# Patient Record
Sex: Female | Born: 1935
Health system: Southern US, Community
[De-identification: ages and names within clinical notes are randomized; demographics above are authoritative.]

## PROBLEM LIST (undated history)

## (undated) DIAGNOSIS — I10 Essential (primary) hypertension: Secondary | ICD-10-CM

## (undated) HISTORY — PX: ABDOMINAL HYSTERECTOMY: SHX81

## (undated) HISTORY — PX: OVARIAN CYST REMOVAL: SHX89

---

## 1993-04-09 HISTORY — PX: DUODENAL DIVERTICULECTOMY: SHX6386

## 2014-12-11 DIAGNOSIS — J410 Simple chronic bronchitis: Secondary | ICD-10-CM | POA: Diagnosis not present

## 2015-01-17 DIAGNOSIS — F341 Dysthymic disorder: Secondary | ICD-10-CM | POA: Diagnosis not present

## 2015-01-17 DIAGNOSIS — I10 Essential (primary) hypertension: Secondary | ICD-10-CM | POA: Diagnosis not present

## 2015-01-17 DIAGNOSIS — J449 Chronic obstructive pulmonary disease, unspecified: Secondary | ICD-10-CM | POA: Diagnosis not present

## 2015-01-17 DIAGNOSIS — Z6822 Body mass index (BMI) 22.0-22.9, adult: Secondary | ICD-10-CM | POA: Diagnosis not present

## 2015-01-17 DIAGNOSIS — E785 Hyperlipidemia, unspecified: Secondary | ICD-10-CM | POA: Diagnosis not present

## 2015-01-28 DIAGNOSIS — J410 Simple chronic bronchitis: Secondary | ICD-10-CM | POA: Diagnosis not present

## 2015-02-05 DIAGNOSIS — Z6822 Body mass index (BMI) 22.0-22.9, adult: Secondary | ICD-10-CM | POA: Diagnosis not present

## 2015-02-05 DIAGNOSIS — Z0000 Encounter for general adult medical examination without abnormal findings: Secondary | ICD-10-CM | POA: Diagnosis not present

## 2015-02-14 DIAGNOSIS — Z13820 Encounter for screening for osteoporosis: Secondary | ICD-10-CM | POA: Diagnosis not present

## 2015-02-14 DIAGNOSIS — Z1231 Encounter for screening mammogram for malignant neoplasm of breast: Secondary | ICD-10-CM | POA: Diagnosis not present

## 2015-02-14 DIAGNOSIS — M8589 Other specified disorders of bone density and structure, multiple sites: Secondary | ICD-10-CM | POA: Diagnosis not present

## 2015-07-16 DIAGNOSIS — I10 Essential (primary) hypertension: Secondary | ICD-10-CM | POA: Diagnosis not present

## 2015-07-16 DIAGNOSIS — E785 Hyperlipidemia, unspecified: Secondary | ICD-10-CM | POA: Diagnosis not present

## 2015-07-16 DIAGNOSIS — Z6822 Body mass index (BMI) 22.0-22.9, adult: Secondary | ICD-10-CM | POA: Diagnosis not present

## 2015-07-16 DIAGNOSIS — J449 Chronic obstructive pulmonary disease, unspecified: Secondary | ICD-10-CM | POA: Diagnosis not present

## 2015-07-22 DIAGNOSIS — Z1211 Encounter for screening for malignant neoplasm of colon: Secondary | ICD-10-CM | POA: Diagnosis not present

## 2015-09-15 DIAGNOSIS — Z23 Encounter for immunization: Secondary | ICD-10-CM | POA: Diagnosis not present

## 2015-12-23 DIAGNOSIS — R6889 Other general symptoms and signs: Secondary | ICD-10-CM | POA: Diagnosis not present

## 2015-12-26 DIAGNOSIS — C4442 Squamous cell carcinoma of skin of scalp and neck: Secondary | ICD-10-CM | POA: Diagnosis not present

## 2015-12-26 DIAGNOSIS — L821 Other seborrheic keratosis: Secondary | ICD-10-CM | POA: Diagnosis not present

## 2015-12-26 DIAGNOSIS — D1801 Hemangioma of skin and subcutaneous tissue: Secondary | ICD-10-CM | POA: Diagnosis not present

## 2015-12-26 DIAGNOSIS — L570 Actinic keratosis: Secondary | ICD-10-CM | POA: Diagnosis not present

## 2016-01-05 DIAGNOSIS — J449 Chronic obstructive pulmonary disease, unspecified: Secondary | ICD-10-CM | POA: Diagnosis not present

## 2016-01-06 DIAGNOSIS — J449 Chronic obstructive pulmonary disease, unspecified: Secondary | ICD-10-CM | POA: Diagnosis not present

## 2016-01-14 DIAGNOSIS — J449 Chronic obstructive pulmonary disease, unspecified: Secondary | ICD-10-CM | POA: Diagnosis not present

## 2016-01-14 DIAGNOSIS — I10 Essential (primary) hypertension: Secondary | ICD-10-CM | POA: Diagnosis not present

## 2016-01-14 DIAGNOSIS — M810 Age-related osteoporosis without current pathological fracture: Secondary | ICD-10-CM | POA: Diagnosis not present

## 2016-01-14 DIAGNOSIS — F341 Dysthymic disorder: Secondary | ICD-10-CM | POA: Diagnosis not present

## 2016-01-14 DIAGNOSIS — E785 Hyperlipidemia, unspecified: Secondary | ICD-10-CM | POA: Diagnosis not present

## 2016-01-15 DIAGNOSIS — R143 Flatulence: Secondary | ICD-10-CM | POA: Diagnosis not present

## 2016-01-15 DIAGNOSIS — K589 Irritable bowel syndrome without diarrhea: Secondary | ICD-10-CM | POA: Diagnosis not present

## 2016-01-16 DIAGNOSIS — J449 Chronic obstructive pulmonary disease, unspecified: Secondary | ICD-10-CM | POA: Diagnosis not present

## 2016-01-16 DIAGNOSIS — R1011 Right upper quadrant pain: Secondary | ICD-10-CM | POA: Diagnosis not present

## 2016-02-16 DIAGNOSIS — J449 Chronic obstructive pulmonary disease, unspecified: Secondary | ICD-10-CM | POA: Diagnosis not present

## 2016-02-19 DIAGNOSIS — Z1231 Encounter for screening mammogram for malignant neoplasm of breast: Secondary | ICD-10-CM | POA: Diagnosis not present

## 2016-03-17 DIAGNOSIS — J449 Chronic obstructive pulmonary disease, unspecified: Secondary | ICD-10-CM | POA: Diagnosis not present

## 2016-04-17 DIAGNOSIS — J449 Chronic obstructive pulmonary disease, unspecified: Secondary | ICD-10-CM | POA: Diagnosis not present

## 2016-05-17 DIAGNOSIS — J449 Chronic obstructive pulmonary disease, unspecified: Secondary | ICD-10-CM | POA: Diagnosis not present

## 2016-06-17 DIAGNOSIS — J449 Chronic obstructive pulmonary disease, unspecified: Secondary | ICD-10-CM | POA: Diagnosis not present

## 2016-06-22 DIAGNOSIS — R6889 Other general symptoms and signs: Secondary | ICD-10-CM | POA: Diagnosis not present

## 2016-07-06 DIAGNOSIS — R6889 Other general symptoms and signs: Secondary | ICD-10-CM | POA: Diagnosis not present

## 2016-07-14 DIAGNOSIS — M810 Age-related osteoporosis without current pathological fracture: Secondary | ICD-10-CM | POA: Diagnosis not present

## 2016-07-14 DIAGNOSIS — I10 Essential (primary) hypertension: Secondary | ICD-10-CM | POA: Diagnosis not present

## 2016-07-14 DIAGNOSIS — Z6822 Body mass index (BMI) 22.0-22.9, adult: Secondary | ICD-10-CM | POA: Diagnosis not present

## 2016-07-14 DIAGNOSIS — J449 Chronic obstructive pulmonary disease, unspecified: Secondary | ICD-10-CM | POA: Diagnosis not present

## 2016-07-14 DIAGNOSIS — E785 Hyperlipidemia, unspecified: Secondary | ICD-10-CM | POA: Diagnosis not present

## 2016-07-18 DIAGNOSIS — J449 Chronic obstructive pulmonary disease, unspecified: Secondary | ICD-10-CM | POA: Diagnosis not present

## 2016-08-17 DIAGNOSIS — J449 Chronic obstructive pulmonary disease, unspecified: Secondary | ICD-10-CM | POA: Diagnosis not present

## 2016-09-14 DIAGNOSIS — Z23 Encounter for immunization: Secondary | ICD-10-CM | POA: Diagnosis not present

## 2016-09-17 DIAGNOSIS — J449 Chronic obstructive pulmonary disease, unspecified: Secondary | ICD-10-CM | POA: Diagnosis not present

## 2017-03-17 DIAGNOSIS — J449 Chronic obstructive pulmonary disease, unspecified: Secondary | ICD-10-CM | POA: Diagnosis not present

## 2017-04-17 DIAGNOSIS — J449 Chronic obstructive pulmonary disease, unspecified: Secondary | ICD-10-CM | POA: Diagnosis not present

## 2017-05-17 DIAGNOSIS — J449 Chronic obstructive pulmonary disease, unspecified: Secondary | ICD-10-CM | POA: Diagnosis not present

## 2017-06-17 DIAGNOSIS — J449 Chronic obstructive pulmonary disease, unspecified: Secondary | ICD-10-CM | POA: Diagnosis not present

## 2017-07-18 DIAGNOSIS — J449 Chronic obstructive pulmonary disease, unspecified: Secondary | ICD-10-CM | POA: Diagnosis not present

## 2017-07-21 DIAGNOSIS — E782 Mixed hyperlipidemia: Secondary | ICD-10-CM | POA: Diagnosis not present

## 2017-07-21 DIAGNOSIS — G4733 Obstructive sleep apnea (adult) (pediatric): Secondary | ICD-10-CM | POA: Diagnosis not present

## 2017-07-21 DIAGNOSIS — K210 Gastro-esophageal reflux disease with esophagitis: Secondary | ICD-10-CM | POA: Diagnosis not present

## 2017-07-21 DIAGNOSIS — D531 Other megaloblastic anemias, not elsewhere classified: Secondary | ICD-10-CM | POA: Diagnosis not present

## 2017-07-21 DIAGNOSIS — I10 Essential (primary) hypertension: Secondary | ICD-10-CM | POA: Diagnosis not present

## 2017-07-28 DIAGNOSIS — K5289 Other specified noninfective gastroenteritis and colitis: Secondary | ICD-10-CM | POA: Diagnosis not present

## 2017-07-29 DIAGNOSIS — R194 Change in bowel habit: Secondary | ICD-10-CM | POA: Diagnosis not present

## 2017-09-17 DIAGNOSIS — J449 Chronic obstructive pulmonary disease, unspecified: Secondary | ICD-10-CM | POA: Diagnosis not present

## 2017-09-22 DIAGNOSIS — R197 Diarrhea, unspecified: Secondary | ICD-10-CM | POA: Diagnosis not present

## 2017-09-27 DIAGNOSIS — A0472 Enterocolitis due to Clostridium difficile, not specified as recurrent: Secondary | ICD-10-CM | POA: Diagnosis not present

## 2017-10-17 DIAGNOSIS — J449 Chronic obstructive pulmonary disease, unspecified: Secondary | ICD-10-CM | POA: Diagnosis not present

## 2017-10-18 DIAGNOSIS — K589 Irritable bowel syndrome without diarrhea: Secondary | ICD-10-CM | POA: Diagnosis not present

## 2017-11-17 DIAGNOSIS — J449 Chronic obstructive pulmonary disease, unspecified: Secondary | ICD-10-CM | POA: Diagnosis not present

## 2017-11-24 DIAGNOSIS — G4733 Obstructive sleep apnea (adult) (pediatric): Secondary | ICD-10-CM | POA: Diagnosis not present

## 2017-11-24 DIAGNOSIS — E782 Mixed hyperlipidemia: Secondary | ICD-10-CM | POA: Diagnosis not present

## 2017-11-24 DIAGNOSIS — K210 Gastro-esophageal reflux disease with esophagitis: Secondary | ICD-10-CM | POA: Diagnosis not present

## 2017-11-24 DIAGNOSIS — I10 Essential (primary) hypertension: Secondary | ICD-10-CM | POA: Diagnosis not present

## 2017-12-18 DIAGNOSIS — J449 Chronic obstructive pulmonary disease, unspecified: Secondary | ICD-10-CM | POA: Diagnosis not present

## 2018-01-15 DIAGNOSIS — J449 Chronic obstructive pulmonary disease, unspecified: Secondary | ICD-10-CM | POA: Diagnosis not present

## 2018-02-15 DIAGNOSIS — J449 Chronic obstructive pulmonary disease, unspecified: Secondary | ICD-10-CM | POA: Diagnosis not present

## 2018-03-17 DIAGNOSIS — J449 Chronic obstructive pulmonary disease, unspecified: Secondary | ICD-10-CM | POA: Diagnosis not present

## 2018-03-24 DIAGNOSIS — G4733 Obstructive sleep apnea (adult) (pediatric): Secondary | ICD-10-CM | POA: Diagnosis not present

## 2018-03-24 DIAGNOSIS — E782 Mixed hyperlipidemia: Secondary | ICD-10-CM | POA: Diagnosis not present

## 2018-03-24 DIAGNOSIS — I10 Essential (primary) hypertension: Secondary | ICD-10-CM | POA: Diagnosis not present

## 2018-03-24 DIAGNOSIS — K210 Gastro-esophageal reflux disease with esophagitis: Secondary | ICD-10-CM | POA: Diagnosis not present

## 2018-03-27 DIAGNOSIS — J410 Simple chronic bronchitis: Secondary | ICD-10-CM | POA: Diagnosis not present

## 2018-03-27 DIAGNOSIS — J449 Chronic obstructive pulmonary disease, unspecified: Secondary | ICD-10-CM | POA: Diagnosis not present

## 2018-04-17 DIAGNOSIS — J449 Chronic obstructive pulmonary disease, unspecified: Secondary | ICD-10-CM | POA: Diagnosis not present

## 2018-04-26 DIAGNOSIS — H10021 Other mucopurulent conjunctivitis, right eye: Secondary | ICD-10-CM | POA: Diagnosis not present

## 2018-05-02 DIAGNOSIS — J449 Chronic obstructive pulmonary disease, unspecified: Secondary | ICD-10-CM | POA: Diagnosis not present

## 2018-05-02 DIAGNOSIS — J410 Simple chronic bronchitis: Secondary | ICD-10-CM | POA: Diagnosis not present

## 2018-05-17 DIAGNOSIS — J449 Chronic obstructive pulmonary disease, unspecified: Secondary | ICD-10-CM | POA: Diagnosis not present

## 2018-05-30 DIAGNOSIS — J410 Simple chronic bronchitis: Secondary | ICD-10-CM | POA: Diagnosis not present

## 2018-05-30 DIAGNOSIS — J449 Chronic obstructive pulmonary disease, unspecified: Secondary | ICD-10-CM | POA: Diagnosis not present

## 2018-06-06 DIAGNOSIS — J441 Chronic obstructive pulmonary disease with (acute) exacerbation: Secondary | ICD-10-CM | POA: Diagnosis not present

## 2018-06-17 DIAGNOSIS — J449 Chronic obstructive pulmonary disease, unspecified: Secondary | ICD-10-CM | POA: Diagnosis not present

## 2018-06-28 DIAGNOSIS — J410 Simple chronic bronchitis: Secondary | ICD-10-CM | POA: Diagnosis not present

## 2018-06-28 DIAGNOSIS — J449 Chronic obstructive pulmonary disease, unspecified: Secondary | ICD-10-CM | POA: Diagnosis not present

## 2018-06-29 ENCOUNTER — Other Ambulatory Visit: Payer: Self-pay

## 2018-06-29 NOTE — Patient Outreach (Signed)
[facility]) Care Management  06/29/2018  Brenda Patrick July 29, 1932 [id number]   Medication Adherence call to Brenda Patrick left a message for patient to call back patient is due on Simvastatin 20 mg. Brenda Patrick is showing past due under [place].  [place] Management Direct Dial [phone]  Fax [phone] [email or web address]

## 2018-07-18 DIAGNOSIS — J449 Chronic obstructive pulmonary disease, unspecified: Secondary | ICD-10-CM | POA: Diagnosis not present

## 2018-07-27 ENCOUNTER — Other Ambulatory Visit: Payer: Self-pay

## 2018-07-27 NOTE — Patient Outreach (Signed)
[facility]) Care Management  07/27/2018  JANEESE MCGLOIN 25-Sep-1932 [id number]   Medication Adherence call to Mr. Roe Coombs patient telephone number is disconnected under [facility]. Epic has a number but not sure if this if is a correct telephone left a message for patient to call back patient is due on Lovastatin 20 mg. Patient is showing past due under [place].  [place] Management Direct Dial [phone]  Fax [phone] [email or web address]

## 2018-08-01 DIAGNOSIS — Z23 Encounter for immunization: Secondary | ICD-10-CM | POA: Diagnosis not present

## 2018-08-01 DIAGNOSIS — M7989 Other specified soft tissue disorders: Secondary | ICD-10-CM | POA: Diagnosis not present

## 2018-08-01 DIAGNOSIS — M79671 Pain in right foot: Secondary | ICD-10-CM | POA: Diagnosis not present

## 2018-08-07 DIAGNOSIS — J410 Simple chronic bronchitis: Secondary | ICD-10-CM | POA: Diagnosis not present

## 2018-08-07 DIAGNOSIS — J449 Chronic obstructive pulmonary disease, unspecified: Secondary | ICD-10-CM | POA: Diagnosis not present

## 2018-08-17 DIAGNOSIS — J449 Chronic obstructive pulmonary disease, unspecified: Secondary | ICD-10-CM | POA: Diagnosis not present

## 2018-08-17 DIAGNOSIS — M79671 Pain in right foot: Secondary | ICD-10-CM | POA: Diagnosis not present

## 2018-08-31 DIAGNOSIS — J449 Chronic obstructive pulmonary disease, unspecified: Secondary | ICD-10-CM | POA: Diagnosis not present

## 2018-08-31 DIAGNOSIS — J410 Simple chronic bronchitis: Secondary | ICD-10-CM | POA: Diagnosis not present

## 2018-09-14 DIAGNOSIS — M79671 Pain in right foot: Secondary | ICD-10-CM | POA: Diagnosis not present

## 2018-09-17 DIAGNOSIS — J449 Chronic obstructive pulmonary disease, unspecified: Secondary | ICD-10-CM | POA: Diagnosis not present

## 2018-09-18 DIAGNOSIS — J449 Chronic obstructive pulmonary disease, unspecified: Secondary | ICD-10-CM | POA: Diagnosis not present

## 2018-09-18 DIAGNOSIS — J410 Simple chronic bronchitis: Secondary | ICD-10-CM | POA: Diagnosis not present

## 2018-09-25 DIAGNOSIS — M79671 Pain in right foot: Secondary | ICD-10-CM | POA: Diagnosis not present

## 2018-09-25 DIAGNOSIS — I10 Essential (primary) hypertension: Secondary | ICD-10-CM | POA: Diagnosis not present

## 2018-09-25 DIAGNOSIS — J449 Chronic obstructive pulmonary disease, unspecified: Secondary | ICD-10-CM | POA: Diagnosis not present

## 2018-09-25 DIAGNOSIS — E782 Mixed hyperlipidemia: Secondary | ICD-10-CM | POA: Diagnosis not present

## 2018-10-03 ENCOUNTER — Encounter: Payer: Self-pay | Admitting: Podiatry

## 2018-10-03 ENCOUNTER — Other Ambulatory Visit: Payer: Self-pay

## 2018-10-03 ENCOUNTER — Ambulatory Visit (INDEPENDENT_AMBULATORY_CARE_PROVIDER_SITE_OTHER): Payer: Medicare Other

## 2018-10-03 ENCOUNTER — Ambulatory Visit: Payer: Medicare Other | Admitting: Podiatry

## 2018-10-03 VITALS — BP 122/61 | HR 70 | Resp 16 | Wt 115.0 lb

## 2018-10-03 DIAGNOSIS — Q742 Other congenital malformations of lower limb(s), including pelvic girdle: Secondary | ICD-10-CM

## 2018-10-03 DIAGNOSIS — M7750 Other enthesopathy of unspecified foot: Secondary | ICD-10-CM

## 2018-10-03 DIAGNOSIS — M79671 Pain in right foot: Secondary | ICD-10-CM | POA: Diagnosis not present

## 2018-10-03 NOTE — Progress Notes (Signed)
   Subjective:    Patient ID: Brenda Patrick, female    DOB: 06-May-1932, 83 y.o.   MRN: [id number]  HPI    Review of Systems  Musculoskeletal: Positive for arthralgias and myalgias.  All other systems reviewed and are negative.      Objective:   Physical Exam        Assessment & Plan:

## 2018-10-04 NOTE — Progress Notes (Signed)
  Subjective:  Patient ID: Brenda Patrick, female    DOB: 28-Nov-1931,  MRN: [id number]  Chief Complaint  Patient presents with  . Foot Injury    R foot/ankle ( up on the kitchen cabin twisted foot ) x Sept; 3/10 sharp intermittent pian Tx: aspercream, heat, tylenol, and cam boot PT. states," most of the pain is on top of my foot below big toe."   84 y.o. female presents with the above complaint. Above history confirmed with patient.  Review of Systems: Negative except as noted in the HPI. Denies N/V/F/Ch.  No past medical history on file.  Current Outpatient Medications:  .  atenolol (TENORMIN) 50 MG tablet, , Disp: , Rfl:  .  busPIRone (BUSPAR) 10 MG tablet, , Disp: , Rfl:  .  citalopram (CELEXA) 40 MG tablet, , Disp: , Rfl:  .  LORazepam (ATIVAN) 1 MG tablet, , Disp: , Rfl:  .  losartan-hydrochlorothiazide (HYZAAR) 100-12.5 MG tablet, , Disp: , Rfl:  .  simvastatin (ZOCOR) 20 MG tablet, Take 20 mg by mouth daily., Disp: , Rfl:   Social History   Tobacco Use  Smoking Status Never Smoker  Smokeless Tobacco Never Used    Not on File Objective:   Vitals:   10/03/18 1545  BP: 122/61  Pulse: 70  Resp: 16   There is no height or weight on file to calculate BMI. Constitutional Well developed. Well nourished.  Vascular Dorsalis pedis pulses palpable bilaterally. Posterior tibial pulses palpable bilaterally. Capillary refill normal to all digits.  No cyanosis or clubbing noted. Pedal hair growth normal.  Neurologic Normal speech. Oriented to person, place, and time. Epicritic sensation to light touch grossly present bilaterally.  Dermatologic Nails well groomed and normal in appearance. No open wounds. No skin lesions.  Orthopedic: Normal joint ROM without pain or crepitus bilaterally. No visible deformities. POP about the dorsal navicular bone   Radiographs: Taken and reviewed guerilla navicular bone noted, small cortical irregularity dorsal navicular  bone. Assessment:   1. Tendonitis of ankle or foot   2. Pain in right foot   3. Accessory navicular bone of right foot    Plan:  Patient was evaluated and treated and all questions answered.  ?Navicular Fracture vs Tendonitis -XR reviewed concerning for possible small fracture -Continue CAM boot for 2 weeks. -Continue Rest, Ice, Elevation.  Return in about 2 weeks (around 10/17/2018) for fracture f/u.

## 2018-10-05 ENCOUNTER — Other Ambulatory Visit: Payer: Self-pay | Admitting: Podiatry

## 2018-10-05 DIAGNOSIS — Q742 Other congenital malformations of lower limb(s), including pelvic girdle: Secondary | ICD-10-CM

## 2018-10-05 DIAGNOSIS — M7750 Other enthesopathy of unspecified foot: Secondary | ICD-10-CM

## 2018-10-10 DIAGNOSIS — J410 Simple chronic bronchitis: Secondary | ICD-10-CM | POA: Diagnosis not present

## 2018-10-10 DIAGNOSIS — J449 Chronic obstructive pulmonary disease, unspecified: Secondary | ICD-10-CM | POA: Diagnosis not present

## 2018-10-11 DIAGNOSIS — J449 Chronic obstructive pulmonary disease, unspecified: Secondary | ICD-10-CM | POA: Diagnosis not present

## 2018-10-11 DIAGNOSIS — J410 Simple chronic bronchitis: Secondary | ICD-10-CM | POA: Diagnosis not present

## 2018-10-17 ENCOUNTER — Ambulatory Visit (INDEPENDENT_AMBULATORY_CARE_PROVIDER_SITE_OTHER): Payer: Medicare Other

## 2018-10-17 ENCOUNTER — Ambulatory Visit: Payer: Medicare Other | Admitting: Podiatry

## 2018-10-17 DIAGNOSIS — Q742 Other congenital malformations of lower limb(s), including pelvic girdle: Secondary | ICD-10-CM

## 2018-10-17 DIAGNOSIS — M7750 Other enthesopathy of unspecified foot: Secondary | ICD-10-CM | POA: Diagnosis not present

## 2018-10-17 DIAGNOSIS — M7751 Other enthesopathy of right foot: Secondary | ICD-10-CM

## 2018-10-17 DIAGNOSIS — J449 Chronic obstructive pulmonary disease, unspecified: Secondary | ICD-10-CM | POA: Diagnosis not present

## 2018-11-06 DIAGNOSIS — J410 Simple chronic bronchitis: Secondary | ICD-10-CM | POA: Diagnosis not present

## 2018-11-06 DIAGNOSIS — J449 Chronic obstructive pulmonary disease, unspecified: Secondary | ICD-10-CM | POA: Diagnosis not present

## 2018-11-12 NOTE — Progress Notes (Signed)
  Subjective:  Patient ID: Brenda Patrick, female    DOB: [birth year],  MRN: [id number]  Chief Complaint  Patient presents with  . Foot Injury    F/U foot fx Pt. states," it's bettter, but it's still tender in the inner sid eof my foot." Tx: cam boot, and tylenol    84 y.o. female presents with the above complaint. Above history confirmed with patient.  Review of Systems: Negative except as noted in the HPI. Denies N/V/F/Ch.  No past medical history on file.  Current Outpatient Medications:  .  atenolol (TENORMIN) 50 MG tablet, , Disp: , Rfl:  .  busPIRone (BUSPAR) 10 MG tablet, , Disp: , Rfl:  .  citalopram (CELEXA) 40 MG tablet, , Disp: , Rfl:  .  LORazepam (ATIVAN) 1 MG tablet, , Disp: , Rfl:  .  losartan-hydrochlorothiazide (HYZAAR) 100-12.5 MG tablet, , Disp: , Rfl:  .  simvastatin (ZOCOR) 20 MG tablet, Take 20 mg by mouth daily., Disp: , Rfl:   Social History   Tobacco Use  Smoking Status Never Smoker  Smokeless Tobacco Never Used    Not on File Objective:   There were no vitals filed for this visit. There is no height or weight on file to calculate BMI. Constitutional Well developed. Well nourished.  Vascular Dorsalis pedis pulses palpable bilaterally. Posterior tibial pulses palpable bilaterally. Capillary refill normal to all digits.  No cyanosis or clubbing noted. Pedal hair growth normal.  Neurologic Normal speech. Oriented to person, place, and time. Epicritic sensation to light touch grossly present bilaterally.  Dermatologic Nails well groomed and normal in appearance. No open wounds. No skin lesions.  Orthopedic: Normal joint ROM without pain or crepitus bilaterally. No visible deformities. POP about the dorsal navicular bone   Radiographs: Taken and reviewed no acute fractures Assessment:   1. Tendonitis of ankle or foot   2. Accessory navicular bone of right foot    Plan:  Patient was evaluated and treated and all questions  answered.  ?Navicular Fracture vs Tendonitis -Transition to cam boot per patient tolerance.  Continue icing and elevation.  No follow-ups on file.

## 2018-11-17 DIAGNOSIS — J449 Chronic obstructive pulmonary disease, unspecified: Secondary | ICD-10-CM | POA: Diagnosis not present

## 2018-11-28 DIAGNOSIS — J180 Bronchopneumonia, unspecified organism: Secondary | ICD-10-CM | POA: Diagnosis not present

## 2018-11-30 DIAGNOSIS — J449 Chronic obstructive pulmonary disease, unspecified: Secondary | ICD-10-CM | POA: Diagnosis not present

## 2018-11-30 DIAGNOSIS — J410 Simple chronic bronchitis: Secondary | ICD-10-CM | POA: Diagnosis not present

## 2018-12-12 ENCOUNTER — Other Ambulatory Visit: Payer: Self-pay

## 2018-12-12 ENCOUNTER — Ambulatory Visit: Payer: Medicare Other | Admitting: Podiatry

## 2018-12-12 DIAGNOSIS — M7750 Other enthesopathy of unspecified foot: Secondary | ICD-10-CM

## 2018-12-12 DIAGNOSIS — M7751 Other enthesopathy of right foot: Secondary | ICD-10-CM | POA: Diagnosis not present

## 2018-12-12 NOTE — Progress Notes (Signed)
  Subjective:  Patient ID: Brenda Patrick, female    DOB: [birth year],  MRN: [id number]  No chief complaint on file.   85 y.o. female presents with the above complaint.  Reports pain at certain times on the right foot sharp pain when walking or if she is walking too long.  Reports 1010 sharp pain has tried ice and rest and Tylenol without relief.   Review of Systems: Negative except as noted in the HPI. Denies N/V/F/Ch.  No past medical history on file.  Current Outpatient Medications:  .  atenolol (TENORMIN) 50 MG tablet, , Disp: , Rfl:  .  busPIRone (BUSPAR) 10 MG tablet, , Disp: , Rfl:  .  citalopram (CELEXA) 40 MG tablet, , Disp: , Rfl:  .  guaiFENesin-codeine 100-10 MG/5ML syrup, , Disp: , Rfl:  .  LORazepam (ATIVAN) 1 MG tablet, , Disp: , Rfl:  .  losartan-hydrochlorothiazide (HYZAAR) 100-12.5 MG tablet, , Disp: , Rfl:  .  simvastatin (ZOCOR) 20 MG tablet, Take 20 mg by mouth daily., Disp: , Rfl:   Social History   Tobacco Use  Smoking Status Never Smoker  Smokeless Tobacco Never Used    Not on File Objective:  There were no vitals filed for this visit. There is no height or weight on file to calculate BMI. Constitutional Well developed. Well nourished.  Vascular Dorsalis pedis pulses palpable bilaterally. Posterior tibial pulses palpable bilaterally. Capillary refill normal to all digits.  No cyanosis or clubbing noted. Pedal hair growth normal.  Neurologic Normal speech. Oriented to person, place, and time. Epicritic sensation to light touch grossly present bilaterally.  Dermatologic Nails well groomed and normal in appearance. No open wounds. No skin lesions.  Orthopedic: Normal joint ROM without pain or crepitus bilaterally. No visible deformities. Pain on palpation right navicular tuberosity at PT insertion   Radiographs: None Assessment:   1. Tendonitis of ankle or foot    Plan:  Patient was evaluated and treated and all questions  answered.  Capsulitis/tendinitis right hindfoot -Injection delivered as below  Procedure: Tendon injection Location: Right PT insertion  Skin Prep: Alcohol. Injectate: 0.5 cc 1% lidocaine plain, 0.5 cc dexamethasone phosphate. Disposition: Patient tolerated procedure well. Injection site dressed with a band-aid.    No follow-ups on file.

## 2018-12-18 DIAGNOSIS — J449 Chronic obstructive pulmonary disease, unspecified: Secondary | ICD-10-CM | POA: Diagnosis not present

## 2018-12-25 DIAGNOSIS — J449 Chronic obstructive pulmonary disease, unspecified: Secondary | ICD-10-CM | POA: Diagnosis not present

## 2018-12-25 DIAGNOSIS — J410 Simple chronic bronchitis: Secondary | ICD-10-CM | POA: Diagnosis not present

## 2019-01-02 ENCOUNTER — Other Ambulatory Visit: Payer: Self-pay

## 2019-01-02 ENCOUNTER — Ambulatory Visit: Payer: Medicare Other | Admitting: Podiatry

## 2019-01-02 DIAGNOSIS — Q742 Other congenital malformations of lower limb(s), including pelvic girdle: Secondary | ICD-10-CM

## 2019-01-02 DIAGNOSIS — M7750 Other enthesopathy of unspecified foot: Secondary | ICD-10-CM | POA: Diagnosis not present

## 2019-01-13 NOTE — Progress Notes (Signed)
  Subjective:  Patient ID: Brenda Patrick, female    DOB: Dec 23, 1931,  MRN: [id number]  Chief Complaint  Patient presents with  . Tendonitis    F/U R tendonitis and capsulitis Pt. states," it's better, but I still have sharp pains, but nothing like it was; 5/10." Tx: icing, resting and tylenol    85 y.o. female presents with the above complaint.  History above confirmed with patient   Review of Systems: Negative except as noted in the HPI. Denies N/V/F/Ch.  No past medical history on file.  Current Outpatient Medications:  .  atenolol (TENORMIN) 50 MG tablet, , Disp: , Rfl:  .  busPIRone (BUSPAR) 10 MG tablet, , Disp: , Rfl:  .  citalopram (CELEXA) 40 MG tablet, , Disp: , Rfl:  .  fluticasone (FLONASE) 50 MCG/ACT nasal spray, , Disp: , Rfl:  .  guaiFENesin-codeine 100-10 MG/5ML syrup, , Disp: , Rfl:  .  LORazepam (ATIVAN) 1 MG tablet, , Disp: , Rfl:  .  losartan-hydrochlorothiazide (HYZAAR) 100-12.5 MG tablet, , Disp: , Rfl:  .  simvastatin (ZOCOR) 20 MG tablet, Take 20 mg by mouth daily., Disp: , Rfl:   Social History   Tobacco Use  Smoking Status Never Smoker  Smokeless Tobacco Never Used    Not on File Objective:  There were no vitals filed for this visit. There is no height or weight on file to calculate BMI. Constitutional Well developed. Well nourished.  Vascular Dorsalis pedis pulses palpable bilaterally. Posterior tibial pulses palpable bilaterally. Capillary refill normal to all digits.  No cyanosis or clubbing noted. Pedal hair growth normal.  Neurologic Normal speech. Oriented to person, place, and time. Epicritic sensation to light touch grossly present bilaterally.  Dermatologic Nails well groomed and normal in appearance. No open wounds. No skin lesions.  Orthopedic: Normal joint ROM without pain or crepitus bilaterally. No visible deformities. Pain on palpation right navicular tuberosity at PT insertion   Radiographs: None Assessment:   1.  Tendonitis of ankle or foot   2. Accessory navicular bone of right foot    Plan:  Patient was evaluated and treated and all questions answered.  Capsulitis/tendinitis right hindfoot -Hold off further injections discussed icing rest advised to follow-up as needed for further injection   No follow-ups on file.

## 2019-01-16 DIAGNOSIS — J449 Chronic obstructive pulmonary disease, unspecified: Secondary | ICD-10-CM | POA: Diagnosis not present

## 2019-01-29 DIAGNOSIS — J449 Chronic obstructive pulmonary disease, unspecified: Secondary | ICD-10-CM | POA: Diagnosis not present

## 2019-01-29 DIAGNOSIS — J410 Simple chronic bronchitis: Secondary | ICD-10-CM | POA: Diagnosis not present

## 2019-02-06 DIAGNOSIS — J410 Simple chronic bronchitis: Secondary | ICD-10-CM | POA: Diagnosis not present

## 2019-02-16 DIAGNOSIS — J449 Chronic obstructive pulmonary disease, unspecified: Secondary | ICD-10-CM | POA: Diagnosis not present

## 2019-02-28 DIAGNOSIS — J410 Simple chronic bronchitis: Secondary | ICD-10-CM | POA: Diagnosis not present

## 2019-02-28 DIAGNOSIS — J449 Chronic obstructive pulmonary disease, unspecified: Secondary | ICD-10-CM | POA: Diagnosis not present

## 2019-03-18 DIAGNOSIS — J449 Chronic obstructive pulmonary disease, unspecified: Secondary | ICD-10-CM | POA: Diagnosis not present

## 2019-04-13 DIAGNOSIS — J449 Chronic obstructive pulmonary disease, unspecified: Secondary | ICD-10-CM | POA: Diagnosis not present

## 2019-04-18 DIAGNOSIS — J449 Chronic obstructive pulmonary disease, unspecified: Secondary | ICD-10-CM | POA: Diagnosis not present

## 2019-05-15 DIAGNOSIS — J449 Chronic obstructive pulmonary disease, unspecified: Secondary | ICD-10-CM | POA: Diagnosis not present

## 2019-05-18 DIAGNOSIS — J449 Chronic obstructive pulmonary disease, unspecified: Secondary | ICD-10-CM | POA: Diagnosis not present

## 2019-05-22 DIAGNOSIS — G4733 Obstructive sleep apnea (adult) (pediatric): Secondary | ICD-10-CM | POA: Diagnosis not present

## 2019-05-22 DIAGNOSIS — I10 Essential (primary) hypertension: Secondary | ICD-10-CM | POA: Diagnosis not present

## 2019-05-22 DIAGNOSIS — Z1159 Encounter for screening for other viral diseases: Secondary | ICD-10-CM | POA: Diagnosis not present

## 2019-05-22 DIAGNOSIS — E782 Mixed hyperlipidemia: Secondary | ICD-10-CM | POA: Diagnosis not present

## 2019-05-22 DIAGNOSIS — K210 Gastro-esophageal reflux disease with esophagitis: Secondary | ICD-10-CM | POA: Diagnosis not present

## 2019-06-18 DIAGNOSIS — J449 Chronic obstructive pulmonary disease, unspecified: Secondary | ICD-10-CM | POA: Diagnosis not present

## 2019-07-16 DIAGNOSIS — J449 Chronic obstructive pulmonary disease, unspecified: Secondary | ICD-10-CM | POA: Diagnosis not present

## 2019-07-19 DIAGNOSIS — J449 Chronic obstructive pulmonary disease, unspecified: Secondary | ICD-10-CM | POA: Diagnosis not present

## 2019-08-18 DIAGNOSIS — J449 Chronic obstructive pulmonary disease, unspecified: Secondary | ICD-10-CM | POA: Diagnosis not present

## 2019-08-23 DIAGNOSIS — J449 Chronic obstructive pulmonary disease, unspecified: Secondary | ICD-10-CM | POA: Diagnosis not present

## 2019-09-17 DIAGNOSIS — J449 Chronic obstructive pulmonary disease, unspecified: Secondary | ICD-10-CM | POA: Diagnosis not present

## 2019-09-18 DIAGNOSIS — J449 Chronic obstructive pulmonary disease, unspecified: Secondary | ICD-10-CM | POA: Diagnosis not present

## 2019-09-24 DIAGNOSIS — M25519 Pain in unspecified shoulder: Secondary | ICD-10-CM | POA: Diagnosis not present

## 2019-09-24 DIAGNOSIS — M25539 Pain in unspecified wrist: Secondary | ICD-10-CM | POA: Diagnosis not present

## 2019-09-24 DIAGNOSIS — Z1159 Encounter for screening for other viral diseases: Secondary | ICD-10-CM | POA: Diagnosis not present

## 2019-09-24 DIAGNOSIS — M25511 Pain in right shoulder: Secondary | ICD-10-CM | POA: Diagnosis not present

## 2019-09-24 DIAGNOSIS — S4991XA Unspecified injury of right shoulder and upper arm, initial encounter: Secondary | ICD-10-CM | POA: Diagnosis not present

## 2019-09-24 DIAGNOSIS — M25529 Pain in unspecified elbow: Secondary | ICD-10-CM | POA: Diagnosis not present

## 2019-09-26 DIAGNOSIS — S42254A Nondisplaced fracture of greater tuberosity of right humerus, initial encounter for closed fracture: Secondary | ICD-10-CM | POA: Diagnosis not present

## 2019-10-04 DIAGNOSIS — S42254A Nondisplaced fracture of greater tuberosity of right humerus, initial encounter for closed fracture: Secondary | ICD-10-CM | POA: Diagnosis not present

## 2019-10-15 DIAGNOSIS — S42254A Nondisplaced fracture of greater tuberosity of right humerus, initial encounter for closed fracture: Secondary | ICD-10-CM | POA: Diagnosis not present

## 2019-10-16 DIAGNOSIS — M6281 Muscle weakness (generalized): Secondary | ICD-10-CM | POA: Diagnosis not present

## 2019-10-16 DIAGNOSIS — M25611 Stiffness of right shoulder, not elsewhere classified: Secondary | ICD-10-CM | POA: Diagnosis not present

## 2019-10-16 DIAGNOSIS — M25511 Pain in right shoulder: Secondary | ICD-10-CM | POA: Diagnosis not present

## 2019-10-18 DIAGNOSIS — M25611 Stiffness of right shoulder, not elsewhere classified: Secondary | ICD-10-CM | POA: Diagnosis not present

## 2019-10-18 DIAGNOSIS — J449 Chronic obstructive pulmonary disease, unspecified: Secondary | ICD-10-CM | POA: Diagnosis not present

## 2019-10-18 DIAGNOSIS — M25511 Pain in right shoulder: Secondary | ICD-10-CM | POA: Diagnosis not present

## 2019-10-18 DIAGNOSIS — M6281 Muscle weakness (generalized): Secondary | ICD-10-CM | POA: Diagnosis not present

## 2019-10-23 DIAGNOSIS — J449 Chronic obstructive pulmonary disease, unspecified: Secondary | ICD-10-CM | POA: Diagnosis not present

## 2019-10-24 DIAGNOSIS — R509 Fever, unspecified: Secondary | ICD-10-CM | POA: Diagnosis not present

## 2019-10-27 DIAGNOSIS — R5381 Other malaise: Secondary | ICD-10-CM | POA: Diagnosis not present

## 2019-10-27 DIAGNOSIS — R0602 Shortness of breath: Secondary | ICD-10-CM | POA: Diagnosis not present

## 2019-10-27 DIAGNOSIS — U071 COVID-19: Secondary | ICD-10-CM | POA: Diagnosis not present

## 2019-10-27 DIAGNOSIS — R5383 Other fatigue: Secondary | ICD-10-CM | POA: Diagnosis not present

## 2019-10-27 DIAGNOSIS — R531 Weakness: Secondary | ICD-10-CM | POA: Diagnosis not present

## 2019-10-27 DIAGNOSIS — R509 Fever, unspecified: Secondary | ICD-10-CM | POA: Diagnosis not present

## 2019-10-30 ENCOUNTER — Telehealth: Payer: Self-pay | Admitting: Emergency Medicine

## 2019-10-30 ENCOUNTER — Other Ambulatory Visit: Payer: Self-pay

## 2019-10-30 ENCOUNTER — Emergency Department (HOSPITAL_COMMUNITY): Payer: Medicare Other

## 2019-10-30 ENCOUNTER — Encounter (HOSPITAL_COMMUNITY): Payer: Self-pay

## 2019-10-30 ENCOUNTER — Inpatient Hospital Stay (HOSPITAL_COMMUNITY)
Admission: EM | Admit: 2019-10-30 | Discharge: 2019-11-04 | DRG: 177 | Disposition: A | Discharge status: Home / Self Care | Payer: Medicare Other | Attending: Internal Medicine | Admitting: Internal Medicine

## 2019-10-30 ENCOUNTER — Inpatient Hospital Stay (HOSPITAL_COMMUNITY): Payer: Medicare Other

## 2019-10-30 DIAGNOSIS — Z9981 Dependence on supplemental oxygen: Secondary | ICD-10-CM

## 2019-10-30 DIAGNOSIS — R1310 Dysphagia, unspecified: Secondary | ICD-10-CM | POA: Diagnosis present

## 2019-10-30 DIAGNOSIS — J440 Chronic obstructive pulmonary disease with acute lower respiratory infection: Secondary | ICD-10-CM | POA: Diagnosis not present

## 2019-10-30 DIAGNOSIS — U071 COVID-19: Secondary | ICD-10-CM

## 2019-10-30 DIAGNOSIS — J9601 Acute respiratory failure with hypoxia: Secondary | ICD-10-CM | POA: Diagnosis not present

## 2019-10-30 DIAGNOSIS — F419 Anxiety disorder, unspecified: Secondary | ICD-10-CM | POA: Diagnosis present

## 2019-10-30 DIAGNOSIS — R748 Abnormal levels of other serum enzymes: Secondary | ICD-10-CM | POA: Diagnosis present

## 2019-10-30 DIAGNOSIS — Z79899 Other long term (current) drug therapy: Secondary | ICD-10-CM | POA: Diagnosis not present

## 2019-10-30 DIAGNOSIS — F329 Major depressive disorder, single episode, unspecified: Secondary | ICD-10-CM | POA: Diagnosis not present

## 2019-10-30 DIAGNOSIS — T380X5A Adverse effect of glucocorticoids and synthetic analogues, initial encounter: Secondary | ICD-10-CM | POA: Diagnosis not present

## 2019-10-30 DIAGNOSIS — E860 Dehydration: Secondary | ICD-10-CM | POA: Diagnosis present

## 2019-10-30 DIAGNOSIS — I10 Essential (primary) hypertension: Secondary | ICD-10-CM | POA: Diagnosis not present

## 2019-10-30 DIAGNOSIS — J1289 Other viral pneumonia: Secondary | ICD-10-CM | POA: Diagnosis present

## 2019-10-30 DIAGNOSIS — R0902 Hypoxemia: Secondary | ICD-10-CM | POA: Diagnosis not present

## 2019-10-30 DIAGNOSIS — R05 Cough: Secondary | ICD-10-CM | POA: Diagnosis not present

## 2019-10-30 DIAGNOSIS — R531 Weakness: Secondary | ICD-10-CM | POA: Diagnosis not present

## 2019-10-30 HISTORY — DX: Essential (primary) hypertension: I10

## 2019-10-30 HISTORY — DX: COVID-19: U07.1

## 2019-10-30 LAB — CBC WITH DIFFERENTIAL/PLATELET
Abs Immature Granulocytes: 0.1 10*3/uL — ABNORMAL HIGH (ref 0.00–0.07)
Basophils Absolute: 0 10*3/uL (ref 0.0–0.1)
Basophils Relative: 0 %
Eosinophils Absolute: 0 10*3/uL (ref 0.0–0.5)
Eosinophils Relative: 0 %
HCT: 35.8 % — ABNORMAL LOW (ref 36.0–46.0)
Hemoglobin: 11.8 g/dL — ABNORMAL LOW (ref 12.0–15.0)
Immature Granulocytes: 1 %
Lymphocytes Relative: 11 %
Lymphs Abs: 1 10*3/uL (ref 0.7–4.0)
MCH: 32.6 pg (ref 26.0–34.0)
MCHC: 33 g/dL (ref 30.0–36.0)
MCV: 98.9 fL (ref 80.0–100.0)
Monocytes Absolute: 0.4 10*3/uL (ref 0.1–1.0)
Monocytes Relative: 4 %
Neutro Abs: 7.5 10*3/uL (ref 1.7–7.7)
Neutrophils Relative %: 84 %
Platelets: 253 10*3/uL (ref 150–400)
RBC: 3.62 MIL/uL — ABNORMAL LOW (ref 3.87–5.11)
RDW: 12.1 % (ref 11.5–15.5)
WBC: 9 10*3/uL (ref 4.0–10.5)
nRBC: 0 % (ref 0.0–0.2)

## 2019-10-30 LAB — COMPREHENSIVE METABOLIC PANEL
ALT: 38 U/L (ref 0–44)
AST: 55 U/L — ABNORMAL HIGH (ref 15–41)
Albumin: 3.2 g/dL — ABNORMAL LOW (ref 3.5–5.0)
Alkaline Phosphatase: 71 U/L (ref 38–126)
Anion gap: 13 (ref 5–15)
BUN: 18 mg/dL (ref 8–23)
CO2: 27 mmol/L (ref 22–32)
Calcium: 8.9 mg/dL (ref 8.9–10.3)
Chloride: 97 mmol/L — ABNORMAL LOW (ref 98–111)
Creatinine, Ser: 0.87 mg/dL (ref 0.44–1.00)
GFR calc Af Amer: >60 mL/min (ref >60)
GFR calc non Af Amer: >60 mL/min (ref >60)
Glucose, Bld: 99 mg/dL (ref 70–99)
Potassium: 3.5 mmol/L (ref 3.5–5.1)
Sodium: 137 mmol/L (ref 135–145)
Total Bilirubin: 0.5 mg/dL (ref 0.3–1.2)
Total Protein: 6.7 g/dL (ref 6.5–8.1)

## 2019-10-30 LAB — POC SARS CORONAVIRUS 2 AG -  ED: SARS Coronavirus 2 Ag: NEGATIVE

## 2019-10-30 LAB — ABO/RH: ABO/RH(D): O POS

## 2019-10-30 LAB — EXPECTORATED SPUTUM ASSESSMENT W GRAM STAIN, RFLX TO RESP C

## 2019-10-30 LAB — TROPONIN I (HIGH SENSITIVITY): Troponin I (High Sensitivity): 19 ng/L — ABNORMAL HIGH (ref <18)

## 2019-10-30 LAB — PROCALCITONIN: Procalcitonin: 0.19 ng/mL

## 2019-10-30 MED ORDER — ACETAMINOPHEN 325 MG PO TABS
650.0000 mg | ORAL_TABLET | Freq: Four times a day (QID) | ORAL | Status: DC | PRN
  Administered 2019-10-30 – 2019-11-02 (×3): 650 mg via ORAL
  Filled 2019-10-30 (×2): qty 2

## 2019-10-30 MED ORDER — SODIUM CHLORIDE 0.9 % IV BOLUS
1000.0000 mL | Freq: Once | INTRAVENOUS | Status: AC
  Administered 2019-10-30: 11:00:00 1000 mL via INTRAVENOUS

## 2019-10-30 MED ORDER — SODIUM CHLORIDE 0.9 % IV SOLN
200.0000 mg | Freq: Once | INTRAVENOUS | Status: AC
  Administered 2019-10-30: 20:00:00 200 mg via INTRAVENOUS
  Filled 2019-10-30: qty 200

## 2019-10-30 MED ORDER — ADULT MULTIVITAMIN W/MINERALS CH
ORAL_TABLET | Freq: Every day | ORAL | Status: DC
  Administered 2019-10-30 – 2019-11-04 (×6): 1 via ORAL
  Filled 2019-10-30 (×6): qty 1

## 2019-10-30 MED ORDER — LORAZEPAM 1 MG PO TABS
1.0000 mg | ORAL_TABLET | Freq: Every day | ORAL | Status: DC
  Administered 2019-10-30: 22:00:00 1 mg via ORAL
  Filled 2019-10-30: qty 1

## 2019-10-30 MED ORDER — SODIUM CHLORIDE 0.9 % IV SOLN
1.0000 g | INTRAVENOUS | Status: DC
  Administered 2019-10-30: 21:00:00 1 g via INTRAVENOUS
  Filled 2019-10-30: qty 1

## 2019-10-30 MED ORDER — CITALOPRAM HYDROBROMIDE 20 MG PO TABS
40.0000 mg | ORAL_TABLET | Freq: Every day | ORAL | Status: DC
  Administered 2019-10-30 – 2019-11-04 (×6): 40 mg via ORAL
  Filled 2019-10-30 (×6): qty 2

## 2019-10-30 MED ORDER — LOSARTAN POTASSIUM-HCTZ 100-12.5 MG PO TABS: 1.0000 | ORAL_TABLET | Freq: Every day | ORAL | Status: DC

## 2019-10-30 MED ORDER — ENOXAPARIN SODIUM 40 MG/0.4ML ~~LOC~~ SOLN
40.0000 mg | SUBCUTANEOUS | Status: DC
  Administered 2019-10-30 – 2019-11-03 (×5): 40 mg via SUBCUTANEOUS
  Filled 2019-10-30 (×5): qty 0.4

## 2019-10-30 MED ORDER — SODIUM CHLORIDE 0.9% IV SOLUTION: Freq: Once | INTRAVENOUS | Status: AC

## 2019-10-30 MED ORDER — SIMVASTATIN 20 MG PO TABS: 20.0000 mg | ORAL_TABLET | Freq: Every day | ORAL | Status: DC

## 2019-10-30 MED ORDER — SODIUM CHLORIDE 0.9 % IV SOLN
100.0000 mg | Freq: Every day | INTRAVENOUS | Status: AC
  Administered 2019-10-31 – 2019-11-03 (×4): 100 mg via INTRAVENOUS
  Filled 2019-10-30 (×4): qty 100

## 2019-10-30 MED ORDER — SODIUM CHLORIDE 0.9% FLUSH: 10.0000 mL | INTRAVENOUS | Status: DC | PRN

## 2019-10-30 MED ORDER — SODIUM CHLORIDE (PF) 0.9 % IJ SOLN
INTRAMUSCULAR | Status: AC
  Filled 2019-10-30: qty 50

## 2019-10-30 MED ORDER — FLUTICASONE PROPIONATE 50 MCG/ACT NA SUSP
2.0000 | Freq: Every day | NASAL | Status: DC
  Administered 2019-10-30 – 2019-11-04 (×6): 2 via NASAL
  Filled 2019-10-30: qty 16

## 2019-10-30 MED ORDER — AZITHROMYCIN 250 MG PO TABS
250.0000 mg | ORAL_TABLET | Freq: Every day | ORAL | Status: DC
  Administered 2019-10-30: 22:00:00 250 mg via ORAL
  Filled 2019-10-30: qty 1

## 2019-10-30 MED ORDER — DEXAMETHASONE 4 MG PO TABS
6.0000 mg | ORAL_TABLET | Freq: Every day | ORAL | Status: DC
  Administered 2019-10-30 – 2019-11-04 (×6): 6 mg via ORAL
  Filled 2019-10-30 (×6): qty 2

## 2019-10-30 MED ORDER — BUSPIRONE HCL 10 MG PO TABS
10.0000 mg | ORAL_TABLET | Freq: Every day | ORAL | Status: DC
  Administered 2019-10-30 – 2019-11-01 (×3): 10 mg via ORAL
  Filled 2019-10-30 (×3): qty 1

## 2019-10-30 MED ORDER — CHLORHEXIDINE GLUCONATE CLOTH 2 % EX PADS
6.0000 | MEDICATED_PAD | Freq: Every day | CUTANEOUS | Status: DC
  Administered 2019-10-31 – 2019-11-03 (×4): 6 via TOPICAL

## 2019-10-30 MED ORDER — ATENOLOL 50 MG PO TABS
50.0000 mg | ORAL_TABLET | Freq: Every day | ORAL | Status: DC
  Administered 2019-10-30 – 2019-11-04 (×6): 50 mg via ORAL
  Filled 2019-10-30 (×6): qty 1

## 2019-10-30 MED ORDER — IOHEXOL 350 MG/ML SOLN
100.0000 mL | Freq: Once | INTRAVENOUS | Status: AC | PRN
  Administered 2019-10-30: 100 mL via INTRAVENOUS

## 2019-10-30 MED ORDER — LOSARTAN POTASSIUM 50 MG PO TABS
100.0000 mg | ORAL_TABLET | Freq: Every day | ORAL | Status: DC
  Administered 2019-10-30 – 2019-11-04 (×6): 100 mg via ORAL
  Filled 2019-10-30 (×6): qty 2

## 2019-10-30 MED ORDER — HYDROCHLOROTHIAZIDE 12.5 MG PO CAPS
12.5000 mg | ORAL_CAPSULE | Freq: Every day | ORAL | Status: DC
  Administered 2019-10-30 – 2019-11-04 (×6): 12.5 mg via ORAL
  Filled 2019-10-30 (×6): qty 1

## 2019-10-30 MED ORDER — POTASSIUM CHLORIDE CRYS ER 20 MEQ PO TBCR
40.0000 meq | EXTENDED_RELEASE_TABLET | Freq: Every day | ORAL | Status: AC
  Administered 2019-10-30 – 2019-11-01 (×3): 40 meq via ORAL
  Filled 2019-10-30 (×3): qty 2

## 2019-10-30 MED ORDER — SIMVASTATIN 20 MG PO TABS
20.0000 mg | ORAL_TABLET | Freq: Every day | ORAL | Status: DC
  Administered 2019-10-31 – 2019-11-03 (×4): 20 mg via ORAL
  Filled 2019-10-30 (×4): qty 1

## 2019-10-30 NOTE — ED Notes (Addendum)
Rainbow tubes sent to lab in save tubes.  Patient verbalizes she will call out once ready to void using call bell.   EKG captured and stored. No orders at this time.

## 2019-10-30 NOTE — ED Provider Notes (Signed)
[facility] DEPT Provider Note   CSN: [id number] Arrival date & time: 10/30/19  1035     History Chief Complaint  Patient presents with  . COVID POSITIVE  . Dehydration    Brenda Patrick is a 85 y.o. female.  HPI    Patient presents with concern of weakness and dehydration. Patient has history of coronavirus, designated positive a few weeks ago. She notes that over the past few days she has felt progressively weaker, more dehydrated than usual.  She does have history of recent fall, with right arm fracture, but aside from that was in her usual state of health prior to being diagnosed with coronavirus. She currently denies pain beyond her arm, including no chest pain, no abdominal pain, no headache. She denies confusion, disorientation, denies fever, nausea, vomiting, diarrhea.  She states that in spite of trying to take her medication, eat, drink has normal, she feels weaker. Past Medical History:  Diagnosis Date  . Hypertension     There are no problems to display for this patient.   History reviewed. No pertinent surgical history.   OB History   No obstetric history on file.     No family history on file.  Social History   Tobacco Use  . Smoking status: Never Smoker  . Smokeless tobacco: Never Used  Substance Use Topics  . Alcohol use: Not on file  . Drug use: Not on file    Home Medications Prior to Admission medications   Medication Sig Start Date End Date Taking? Authorizing Provider  atenolol (TENORMIN) 50 MG tablet Take 50 mg by mouth daily.  07/12/18  Yes [provider]  busPIRone (BUSPAR) 10 MG tablet Take 10 mg by mouth daily.  09/11/18  Yes [provider]  citalopram (CELEXA) 40 MG tablet Take 40 mg by mouth daily.  08/15/18  Yes [provider]  fluticasone (FLONASE) 50 MCG/ACT nasal spray Place 2 sprays into both nostrils daily.  12/18/18  Yes [provider]  LORazepam  (ATIVAN) 1 MG tablet Take 1 mg by mouth at bedtime.  09/18/18  Yes [provider]  losartan-hydrochlorothiazide (HYZAAR) 100-12.5 MG tablet Take 1 tablet by mouth daily.  07/28/18  Yes [provider]  Multiple Vitamins-Minerals (MULTIVITAMIN WOMEN 50+ PO) Take 1 tablet by mouth daily.   Yes [provider]  simvastatin (ZOCOR) 20 MG tablet Take 20 mg by mouth daily.   Yes [provider]    Allergies    Patient has no known allergies.  Review of Systems   Review of Systems  Constitutional:       Per HPI, otherwise negative  HENT:       Per HPI, otherwise negative  Respiratory:       Per HPI, otherwise negative  Cardiovascular:       Per HPI, otherwise negative  Gastrointestinal: Negative for vomiting.  Endocrine:       Negative aside from HPI  Genitourinary:       Neg aside from HPI   Musculoskeletal:       Per HPI, otherwise negative  Skin: Negative.   Neurological: Positive for weakness. Negative for syncope.    Physical Exam Updated Vital Signs BP (!) 169/69 (BP Location: Left Arm)   Pulse 87   Temp 98.7 F (37.1 C) (Oral)   Resp 20   SpO2 91%   Physical Exam Vitals and nursing note reviewed.  Constitutional:      General: She is  not in acute distress.    Appearance: She is well-developed.  HENT:     Head: Normocephalic and atraumatic.  Eyes:     Conjunctiva/sclera: Conjunctivae normal.  Cardiovascular:     Rate and Rhythm: Normal rate and regular rhythm.  Pulmonary:     Effort: Pulmonary effort is normal. Tachypnea present. No respiratory distress.     Breath sounds: Normal breath sounds. No stridor.  Abdominal:     General: There is no distension.  Musculoskeletal:     Right lower leg: No edema.     Left lower leg: No edema.     Comments: Right arm in splint immobilizer, rigid  Skin:    General: Skin is warm and dry.  Neurological:     Mental Status: She is alert and oriented to person, place, and time.      Cranial Nerves: No cranial nerve deficit.    Patient found to be hypoxic, 88% on room air.  This is abnormal for her. Patient corrected with 2 L nasal cannula. ED Results / Procedures / Treatments   Labs (all labs ordered are listed, but only abnormal results are displayed) Labs Reviewed  COMPREHENSIVE METABOLIC PANEL - Abnormal; Notable for the following components:      Result Value   Chloride 97 (*)    Albumin 3.2 (*)    AST 55 (*)    All other components within normal limits  CBC WITH DIFFERENTIAL/PLATELET - Abnormal; Notable for the following components:   RBC 3.62 (*)    Hemoglobin 11.8 (*)    HCT 35.8 (*)    Abs Immature Granulocytes 0.10 (*)    All other components within normal limits  URINALYSIS, ROUTINE W REFLEX MICROSCOPIC    Radiology DG Chest Port 1 View  Result Date: 10/30/2019 CLINICAL DATA:  COVID.  Weakness. EXAM: PORTABLE CHEST 1 VIEW COMPARISON:  10/27/2019 FINDINGS: Cardiac silhouette normal in size. Moderate to large hiatal hernia. No mediastinal or hilar masses or evidence of adenopathy. Prominent bronchovascular and interstitial markings similar to the prior study. Lungs otherwise clear. No pleural effusion or pneumothorax. Skeletal structures are grossly intact. IMPRESSION: 1. No acute cardiopulmonary disease. Stable appearance from the recent prior study. Electronically Signed   By: Lajean Manes M.D.   On: 10/30/2019 12:36    Procedures Procedures (including critical care time)  Medications Ordered in ED Medications  sodium chloride 0.9 % bolus 1,000 mL (1,000 mLs Intravenous New Bag/Given 10/30/19 1059)    ED Course  I have reviewed the triage vital signs and the nursing notes.  Pertinent labs & imaging results that were available during my care of the patient were reviewed by me and considered in my medical decision making (see chart for details).    MDM Rules/Calculators/A&P                     4:33 PM Patient with supplemental oxygen,  appears weak. I discussed her presentation with her family members, and with the patient's history of coronavirus.  They suspected some degree of her weakness may be related to the patient's husband also being hospitalized, currently intubated. However, the patient is always weak in appearance, continues to require new oxygen for appropriate saturation.  She does have a history of COPD, but does not wear oxygen at home aside from at night. With concern for this, the patient was admitted for further monitoring, management. Final Clinical Impression(s) / ED Diagnoses Final diagnoses:  COVID-19 virus infection  Carmin Muskrat, MD 10/30/19 [phone]

## 2019-10-30 NOTE — ED Notes (Signed)
Pt stood up and SpO2 dropped to 88%. Pt laid back in bed and SPO2 continued to stay at 88-89%. Pt placed on 2L.

## 2019-10-30 NOTE — ED Notes (Signed)
Patient transported to CT 

## 2019-10-30 NOTE — ED Triage Notes (Addendum)
Patient reports testing positive for COVID.  Patient called PCP and told to come to ED for dehydration.   Denies emesis Denies shob  Reports "some" nausea and diarrhea.  States she has not been coughing "much"  Patient reports falling last week and hurting her right shoulder. Denies LOC.     A/Ox4  Dropped off by preacher.   Lives at home with husband

## 2019-10-30 NOTE — ED Notes (Signed)
ED TO INPATIENT HANDOFF REPORT  Name/Age/Gender Brenda Patrick 85 y.o. female  Code Status Advance Directive Documentation     Most Recent Value  Type of Advance Directive  Healthcare Power of Attorney  Pre-existing out of facility DNR order (yellow form or pink MOST form)  --  "MOST" Form in Place?  --      Home/SNF/Other Home  Chief Complaint COVID-19 [U07.1]  Level of Care/Admitting Diagnosis ED Disposition    ED Disposition Condition [facility]: [facility] [100102]  Level of Care: Telemetry [5]  Admit to tele based on following criteria: Other see comments  Comments: covid, hypoxia  Covid Evaluation: Symptomatic Person Under Investigation (PUI)  Diagnosis: COVID-19 [id number]  Admitting Physician: Druscilla Brownie  Attending Physician: Druscilla Brownie  Estimated length of stay: past midnight tomorrow  Certification:: I certify this patient will need inpatient services for at least 2 midnights       Medical History Past Medical History:  Diagnosis Date  . Hypertension     Allergies No Known Allergies  IV Location/Drains/Wounds Patient Lines/Drains/Airways Status   Active Line/Drains/Airways    Name:   Placement date:   Placement time:   Site:   Days:   Peripheral IV 10/30/19 Left Antecubital   10/30/19    1059    Antecubital   less than 1          Labs/Imaging Results for orders placed or performed during the hospital encounter of 10/30/19 (from the past 48 hour(s))  Comprehensive metabolic panel     Status: Abnormal   Collection Time: 10/30/19 10:58 AM  Result Value Ref Range   Sodium 137 135 - 145 mmol/L   Potassium 3.5 3.5 - 5.1 mmol/L   Chloride 97 (L) 98 - 111 mmol/L   CO2 27 22 - 32 mmol/L   Glucose, Bld 99 70 - 99 mg/dL   BUN 18 8 - 23 mg/dL   Creatinine, Ser 0.87 0.44 - 1.00 mg/dL   Calcium 8.9 8.9 - 10.3 mg/dL   Total Protein 6.7 6.5 - 8.1 g/dL   Albumin 3.2 (L) 3.5 - 5.0  g/dL   AST 55 (H) 15 - 41 U/L   ALT 38 0 - 44 U/L   Alkaline Phosphatase 71 38 - 126 U/L   Total Bilirubin 0.5 0.3 - 1.2 mg/dL   GFR calc non Af Amer >60 >60 mL/min   GFR calc Af Amer >60 >60 mL/min   Anion gap 13 5 - 15    Comment: Performed at [facility], [place] [address], [place], [place] [address]  CBC with Differential     Status: Abnormal   Collection Time: 10/30/19 10:58 AM  Result Value Ref Range   WBC 9.0 4.0 - 10.5 K/uL   RBC 3.62 (L) 3.87 - 5.11 MIL/uL   Hemoglobin 11.8 (L) 12.0 - 15.0 g/dL   HCT 35.8 (L) 36.0 - 46.0 %   MCV 98.9 80.0 - 100.0 fL   MCH 32.6 26.0 - 34.0 pg   MCHC 33.0 30.0 - 36.0 g/dL   RDW 12.1 11.5 - 15.5 %   Platelets 253 150 - 400 K/uL   nRBC 0.0 0.0 - 0.2 %   Neutrophils Relative % 84 %   Neutro Abs 7.5 1.7 - 7.7 K/uL   Lymphocytes Relative 11 %   Lymphs Abs 1.0 0.7 - 4.0 K/uL   Monocytes Relative 4 %   Monocytes  Absolute 0.4 0.1 - 1.0 K/uL   Eosinophils Relative 0 %   Eosinophils Absolute 0.0 0.0 - 0.5 K/uL   Basophils Relative 0 %   Basophils Absolute 0.0 0.0 - 0.1 K/uL   Immature Granulocytes 1 %   Abs Immature Granulocytes 0.10 (H) 0.00 - 0.07 K/uL   Reactive, Benign Lymphocytes PRESENT     Comment: Performed at [facility], [place] [address], [place], [place] [address]   DG Chest Port 1 View  Result Date: 10/30/2019 CLINICAL DATA:  COVID.  Weakness. EXAM: PORTABLE CHEST 1 VIEW COMPARISON:  10/27/2019 FINDINGS: Cardiac silhouette normal in size. Moderate to large hiatal hernia. No mediastinal or hilar masses or evidence of adenopathy. Prominent bronchovascular and interstitial markings similar to the prior study. Lungs otherwise clear. No pleural effusion or pneumothorax. Skeletal structures are grossly intact. IMPRESSION: 1. No acute cardiopulmonary disease. Stable appearance from the recent prior study. Electronically Signed   By: Lajean Manes M.D.   On: 10/30/2019 12:36    Pending Labs Unresulted  Labs (From admission, onward)    Start     Ordered   10/30/19 1635  SARS CORONAVIRUS 2 (TAT 6-24 HRS) Nasopharyngeal Nasopharyngeal Swab  (Tier 3 (TAT 6-24 hrs))  Once,   STAT    Question Answer Comment  Is this test for diagnosis or screening Diagnosis of ill patient   Symptomatic for COVID-19 as defined by CDC Yes   Date of Symptom Onset 10/16/2019   Hospitalized for COVID-19 Yes   Admitted to ICU for COVID-19 No   Previously tested for COVID-19 Yes   Resident in a congregate (group) care setting No   Employed in healthcare setting No   Pregnant No      10/30/19 1635   10/30/19 1049  Urinalysis, Routine w reflex microscopic  ONCE - STAT,   STAT     10/30/19 1048          Vitals/Pain Today's Vitals   10/30/19 1530 10/30/19 1600 10/30/19 1630 10/30/19 1721  BP: (!) 149/62 (!) 142/62 (!) 136/58   Pulse: 95 84 87   Resp: (!) 27 20 (!) 23   Temp:      TempSrc:      SpO2: 92% 92% 92%   PainSc:    0-No pain    Isolation Precautions Airborne and Contact precautions  Medications Medications  sodium chloride 0.9 % bolus 1,000 mL (0 mLs Intravenous Stopped 10/30/19 1316)    Mobility walks with device

## 2019-10-30 NOTE — ED Notes (Signed)
The nephew of this patient would like an update on his aunt, Brenda Patrick, Brenda Patrick, [phone].

## 2019-10-30 NOTE — H&P (Signed)
History and Physical    Brenda Patrick [id number] DOB: [birth year] DOA: 10/30/2019  PCP: Lillard Anes, MD   Patient coming from:  home   I have personally briefly reviewed patient's old medical records in [place]  Chief Complaint:   HPI: Brenda Patrick is a 85 y.o. female with medical history significant of hypertension anxiety and depression came to [facility] long regional emergency room with complaints of worsening fatigue and shortness of breath along with cough over past few days.  Patient mentioned that she and her husband were diagnosed with COVID-19 infection about 2 weeks ago at an outside facility.  Her husband is currently admitted at [facility] and is intubated and in ICU.  For the past few days, she has noticed decrease in her energy, unable to do activities of daily living and feeling more short of breath especially with exertion.  She has also noticed low-grade fever of 100.7 over the past 2 3 days along with cough with greenish-yellow phlegm production.  She denies any nausea vomiting chest pain palpitations.  She does have chronic diarrhea from her underlying anxiety.  No congestion.  In the emergency room, her oxygen saturation varied from 88% to 92% on room air and with minimal exertion.  Labs are unremarkable except for low potassium.  Chest x-ray is clear without any vascular congestion or consolidation.  CT angiogram of the chest ordered by me since her hypoxia does not correlate with her chest x-ray findings.  Patient is currently resting comfortably in bed.  She denies any other complaints.   Social history is negative for any smoking alcohol or drug abuse.  Family history is negative for any pulmonary embolism. She does not have a known history of pulmonary embolism  ED Course:  Patient has been given 1 L of IV fluids    Review of Systems: As per HPI otherwise 10 point review of systems negative.     Past Medical  History:  Diagnosis Date  . Hypertension     History reviewed. No pertinent surgical history.   reports that she has never smoked. She has never used smokeless tobacco. No history on file for alcohol and drug.  No Known Allergies  No family history on file.    Prior to Admission medications   Medication Sig Start Date End Date Taking? Authorizing Provider  atenolol (TENORMIN) 50 MG tablet Take 50 mg by mouth daily.  07/12/18  Yes [provider]  busPIRone (BUSPAR) 10 MG tablet Take 10 mg by mouth daily.  09/11/18  Yes [provider]  citalopram (CELEXA) 40 MG tablet Take 40 mg by mouth daily.  08/15/18  Yes [provider]  fluticasone (FLONASE) 50 MCG/ACT nasal spray Place 2 sprays into both nostrils daily.  12/18/18  Yes [provider]  LORazepam (ATIVAN) 1 MG tablet Take 1 mg by mouth at bedtime.  09/18/18  Yes [provider]  losartan-hydrochlorothiazide (HYZAAR) 100-12.5 MG tablet Take 1 tablet by mouth daily.  07/28/18  Yes [provider]  Multiple Vitamins-Minerals (MULTIVITAMIN WOMEN 50+ PO) Take 1 tablet by mouth daily.   Yes [provider]  simvastatin (ZOCOR) 20 MG tablet Take 20 mg by mouth daily.   Yes [provider]    Physical Exam: Vitals:   10/30/19 1500 10/30/19 1530 10/30/19 1600 10/30/19 1630  BP: (!) 147/64 (!) 149/62 (!) 142/62 (!) 136/58  Pulse: 87 95 84 87  Resp: (!) 22 (!) 27 20 (!)  23  Temp:      TempSrc:      SpO2: 96% 92% 92% 92%    Constitutional: NAD, calm, comfortable, weak looking.  Eyes: PERRL, lids and conjunctivae normal ENMT: Mucous membranes are moist. Posterior pharynx clear of any exudate or lesions.Normal dentition.  Neck: normal, supple, no masses, no thyromegaly Respiratory: clear to auscultation bilaterally, no wheezing, no crackles. Normal respiratory effort. No accessory muscle use.  Cardiovascular: Regular rate and rhythm, no murmurs / rubs / gallops. No  extremity edema. 2+ pedal pulses. No carotid bruits.  Abdomen: no tenderness, no masses palpated. No hepatosplenomegaly. Bowel sounds positive.  Musculoskeletal: no clubbing / cyanosis. No joint deformity upper and lower extremities. Good ROM, no contractures. Normal muscle tone.  Skin: no rashes, lesions, ulcers. No induration Neurologic: CN 2-12 grossly intact. Sensation intact, DTR normal. Strength 5/5 in all 4.  Psychiatric: Normal judgment and insight. Alert and oriented x 3. Normal mood.     Labs on Admission: I have personally reviewed following labs and imaging studies  CBC: Recent Labs  Lab 10/30/19 1058  WBC 9.0  NEUTROABS 7.5  HGB 11.8*  HCT 35.8*  MCV 98.9  PLT 123456   Basic Metabolic Panel: Recent Labs  Lab 10/30/19 1058  NA 137  K 3.5  CL 97*  CO2 27  GLUCOSE 99  BUN 18  CREATININE 0.87  CALCIUM 8.9   GFR: CrCl cannot be calculated (Unknown ideal weight.). Liver Function Tests: Recent Labs  Lab 10/30/19 1058  AST 55*  ALT 38  ALKPHOS 71  BILITOT 0.5  PROT 6.7  ALBUMIN 3.2*   No results for input(s): LIPASE, AMYLASE in the last 168 hours. No results for input(s): AMMONIA in the last 168 hours. Coagulation Profile: No results for input(s): INR, PROTIME in the last 168 hours. Cardiac Enzymes: No results for input(s): CKTOTAL, CKMB, CKMBINDEX, TROPONINI in the last 168 hours. BNP (last 3 results) No results for input(s): PROBNP in the last 8760 hours. HbA1C: No results for input(s): HGBA1C in the last 72 hours. CBG: No results for input(s): GLUCAP in the last 168 hours. Lipid Profile: No results for input(s): CHOL, HDL, LDLCALC, TRIG, CHOLHDL, LDLDIRECT in the last 72 hours. Thyroid Function Tests: No results for input(s): TSH, T4TOTAL, FREET4, T3FREE, THYROIDAB in the last 72 hours. Anemia Panel: No results for input(s): VITAMINB12, FOLATE, FERRITIN, TIBC, IRON, RETICCTPCT in the last 72 hours. Urine analysis: No results found for:  COLORURINE, APPEARANCEUR, LABSPEC, PHURINE, GLUCOSEU, HGBUR, BILIRUBINUR, KETONESUR, PROTEINUR, UROBILINOGEN, NITRITE, LEUKOCYTESUR  Radiological Exams on Admission: DG Chest Port 1 View  Result Date: 10/30/2019 CLINICAL DATA:  COVID.  Weakness. EXAM: PORTABLE CHEST 1 VIEW COMPARISON:  10/27/2019 FINDINGS: Cardiac silhouette normal in size. Moderate to large hiatal hernia. No mediastinal or hilar masses or evidence of adenopathy. Prominent bronchovascular and interstitial markings similar to the prior study. Lungs otherwise clear. No pleural effusion or pneumothorax. Skeletal structures are grossly intact. IMPRESSION: 1. No acute cardiopulmonary disease. Stable appearance from the recent prior study. Electronically Signed   By: Lajean Manes M.D.   On: 10/30/2019 12:36    EKG: Independently reviewed.   Assessment/Plan Active Problems:   COVID-19   Essential hypertension    Acute hypoxic respiratory failure suspected due to COVID-19 infection, rule out pulmonary embolism. History of essential hypertension, anxiety DVT prophylaxis-Lovenox   Patient be admitted as inpatient.  CT angiogram of chest ordered. Discussed with patient in detail regarding COVID-19 treatment options, risks and benefits.  She verbalizes  understanding and is agreeable to trying remdesivir, Decadron and convalescent plasma.  We will order all 3.  This patient has been hospitalized for severe complications of [id number] and has not yet completed his/her course of remdesivir.  During the hospitalization the patient's condition has improved to the point that they can be discharged prior to completing 5 days of remdesivir.  Given the evidenced based data that 5 days of remdesivir has been associated with improved patient outcomes, and the current high demand for inpatient resources during the pandemic, this patient will complete the course of remdesivir in an outpatient infusion center at [facility].    Resume home  medications for chronic conditions.  Discussed CODE STATUS with patient.  She verbalizes that she wants to be full code for now.  In case of an emergency, if he is unable to make any decision, she wants to defer her healthcare decisions to her nephew Mr. Mariame Durrette     DVT prophylaxis:    Code Status: full  Family Communication:  Husband is intubated.  Disposition Plan:   Consults called:  None Admission status:   IP    Takeesha Isley Harmon Pier MD [facility] Pager [phone]   If 7PM-7AM, please contact night-coverage [email or web address] Password [facility]  10/30/2019, 5:36 PM

## 2019-10-31 DIAGNOSIS — R748 Abnormal levels of other serum enzymes: Secondary | ICD-10-CM

## 2019-10-31 DIAGNOSIS — J1289 Other viral pneumonia: Secondary | ICD-10-CM

## 2019-10-31 DIAGNOSIS — F329 Major depressive disorder, single episode, unspecified: Secondary | ICD-10-CM

## 2019-10-31 DIAGNOSIS — F419 Anxiety disorder, unspecified: Secondary | ICD-10-CM

## 2019-10-31 LAB — COMPREHENSIVE METABOLIC PANEL
ALT: 35 U/L (ref 0–44)
AST: 48 U/L — ABNORMAL HIGH (ref 15–41)
Albumin: 2.8 g/dL — ABNORMAL LOW (ref 3.5–5.0)
Alkaline Phosphatase: 62 U/L (ref 38–126)
Anion gap: 9 (ref 5–15)
BUN: 15 mg/dL (ref 8–23)
CO2: 27 mmol/L (ref 22–32)
Calcium: 8.3 mg/dL — ABNORMAL LOW (ref 8.9–10.3)
Chloride: 103 mmol/L (ref 98–111)
Creatinine, Ser: 0.71 mg/dL (ref 0.44–1.00)
GFR calc Af Amer: >60 mL/min (ref >60)
GFR calc non Af Amer: >60 mL/min (ref >60)
Glucose, Bld: 133 mg/dL — ABNORMAL HIGH (ref 70–99)
Potassium: 3.9 mmol/L (ref 3.5–5.1)
Sodium: 139 mmol/L (ref 135–145)
Total Bilirubin: 0.7 mg/dL (ref 0.3–1.2)
Total Protein: 5.9 g/dL — ABNORMAL LOW (ref 6.5–8.1)

## 2019-10-31 LAB — CBC
HCT: 31.8 % — ABNORMAL LOW (ref 36.0–46.0)
Hemoglobin: 10.5 g/dL — ABNORMAL LOW (ref 12.0–15.0)
MCH: 32.7 pg (ref 26.0–34.0)
MCHC: 33 g/dL (ref 30.0–36.0)
MCV: 99.1 fL (ref 80.0–100.0)
Platelets: 243 10*3/uL (ref 150–400)
RBC: 3.21 MIL/uL — ABNORMAL LOW (ref 3.87–5.11)
RDW: 12.2 % (ref 11.5–15.5)
WBC: 4.4 10*3/uL (ref 4.0–10.5)
nRBC: 0 % (ref 0.0–0.2)

## 2019-10-31 LAB — URINALYSIS, ROUTINE W REFLEX MICROSCOPIC
Bilirubin Urine: NEGATIVE
Glucose, UA: NEGATIVE mg/dL
Ketones, ur: 20 mg/dL — AB
Leukocytes,Ua: NEGATIVE
Nitrite: NEGATIVE
Protein, ur: NEGATIVE mg/dL
Specific Gravity, Urine: 1.028 (ref 1.005–1.030)
pH: 5 (ref 5.0–8.0)

## 2019-10-31 LAB — SARS CORONAVIRUS 2 (TAT 6-24 HRS): SARS Coronavirus 2: POSITIVE — AB

## 2019-10-31 LAB — CK TOTAL AND CKMB (NOT AT ARMC)
CK, MB: 4.1 ng/mL (ref 0.5–5.0)
Relative Index: INVALID (ref 0.0–2.5)
Total CK: 43 U/L (ref 38–234)

## 2019-10-31 LAB — BRAIN NATRIURETIC PEPTIDE: B Natriuretic Peptide: 329.9 pg/mL — ABNORMAL HIGH (ref 0.0–100.0)

## 2019-10-31 LAB — D-DIMER, QUANTITATIVE: D-Dimer, Quant: 1.09 ug/mL-FEU — ABNORMAL HIGH (ref 0.00–0.50)

## 2019-10-31 LAB — PROCALCITONIN: Procalcitonin: 0.17 ng/mL

## 2019-10-31 LAB — C-REACTIVE PROTEIN: CRP: 12.9 mg/dL — ABNORMAL HIGH (ref <1.0)

## 2019-10-31 LAB — MAGNESIUM: Magnesium: 1.8 mg/dL (ref 1.7–2.4)

## 2019-10-31 LAB — FERRITIN: Ferritin: 319 ng/mL — ABNORMAL HIGH (ref 11–307)

## 2019-10-31 LAB — LACTATE DEHYDROGENASE: LDH: 318 U/L — ABNORMAL HIGH (ref 98–192)

## 2019-10-31 MED ORDER — ENSURE ENLIVE PO LIQD
237.0000 mL | Freq: Two times a day (BID) | ORAL | Status: DC
  Administered 2019-10-31 – 2019-11-02 (×3): 237 mL via ORAL

## 2019-10-31 MED ORDER — CLONAZEPAM 1 MG PO TABS
1.0000 mg | ORAL_TABLET | Freq: Every day | ORAL | Status: DC
  Administered 2019-10-31: 1 mg via ORAL
  Filled 2019-10-31: qty 1

## 2019-10-31 MED ORDER — AMLODIPINE BESYLATE 5 MG PO TABS
5.0000 mg | ORAL_TABLET | Freq: Every day | ORAL | Status: DC
  Administered 2019-10-31 – 2019-11-04 (×5): 5 mg via ORAL
  Filled 2019-10-31 (×5): qty 1

## 2019-10-31 NOTE — Progress Notes (Signed)
CRITICAL VALUE ALERT  Critical Value:  Positive COVID 19  Date & Time Notied:  10/31/19  Provider Notified: Patriciaann Clan  Orders Received/Actions taken: N/A

## 2019-10-31 NOTE — Progress Notes (Signed)
Initial Nutrition Assessment  RD working remotely.   DOCUMENTATION CODES:   Not applicable  INTERVENTION:  - will order Ensure Enlive BID, each supplement provides 350 kcal and 20 grams of protein. - will order Magic Cup BID with meals, each supplement provides 290 kcal and 9 grams of protein. - will order daily multivitamin with minerals.    NUTRITION DIAGNOSIS:   Increased nutrient needs related to acute illness(COVID-19) as evidenced by estimated needs.  GOAL:   Patient will meet greater than or equal to 90% of their needs  MONITOR:   PO intake, Supplement acceptance, Labs, Weight trends  REASON FOR ASSESSMENT:   Malnutrition Screening Tool  ASSESSMENT:   85 y.o. female with medical history significant of HTN, anxiety, and depression. She presented to the ED with complaints of worsening fatigue, SOB, and cough for several days. She reported being diagnosed with COVID-19 about 2 weeks PTA. He husband is currently admitted at [facility]. She has chronic diarrhea from her underlying anxiety. CXR clear.  No intakes documented since admission. Unable to reach patient by phone at this time. Current weight is 116 lb and this is consistent with weight in November 2019; stable weight x1 year.   Per notes: - acute hypoxic respiratory failure d/t COVID-19   Labs reviewed; Ca: 8.3 mg/dl. Medications reviewed; 40 mEq Klor-Con/day x3 days, 200 mg remdesivir x1 dose 12/15, 100 mg remdesivir x1 dose/day x4 days.    NUTRITION - FOCUSED PHYSICAL EXAM:  unable to complete for COVID-19 positive patient.  Diet Order:   Diet Order            Diet Heart Room service appropriate? Yes; Fluid consistency: Thin  Diet effective now              EDUCATION NEEDS:   No education needs have been identified at this time  Skin:  Skin Assessment: Reviewed RN Assessment  Last BM:  12/15  Height:   Ht Readings from Last 1 Encounters:  10/30/19 5\' 2"  (1.575 m)    Weight:   Wt Readings  from Last 1 Encounters:  10/30/19 52.6 kg    Ideal Body Weight:  52 kg  BMI:  Body mass index is 21.21 kg/m.  Estimated Nutritional Needs:   Kcal:  1575-1840 kcal  Protein:  70-85 grams  Fluid:  >/= 1.7 L/day      Jarome Matin, MS, RD, LDN, [facility] Inpatient Clinical Dietitian Pager # [phone] After hours/weekend pager # [phone]

## 2019-10-31 NOTE — Progress Notes (Signed)
Unable to get urine sample because each time the patient voids it mixes up with stool.

## 2019-10-31 NOTE — Progress Notes (Signed)
Notified on call provider K. Eubanks of patient's temp and FFP transfusion ordered, she stated it's okay to give the FFP. Waiting on blood bank to prep FFP.

## 2019-10-31 NOTE — Progress Notes (Signed)
PROGRESS NOTE  Brenda Patrick [id number] DOB: [birth year]   PCP: Lillard Anes, MD  Patient is from: Home.  Independently ambulates at baseline.  DOA: 10/30/2019 LOS: 1  Brief Narrative / Interim history: 85 year old female with history of HTN, anxiety and depression presented with worsening shortness of breath, fatigue, productive cough and fever.  Tested positive for COVID-19 2 weeks prior to our admission.  Husband currently intubated and in ICU at [facility].  In ED, saturating from 88% to 92% on RA.  Slightly tachypneic.  CMP and CBC without significant finding other than AST to 55 and Hgb to 11.8.  High-sensitivity troponin 19.  Pro-Cal 0.19.  UA with 20 ketones.  COVID-19 PCR positive.  CXR without significant finding.  Blood and sputum cultures obtained.  CTA chest ordered.  Started on ceftriaxone, azithromycin, Decadron and remdesivir and admitted for COVID-19 pneumonia and possible bacterial CAP.   CTA chest negative for PE but reveals groundglass opacities in RUL and LUL.  Ceftriaxone and azithromycin discontinued the next day.   Subjective: No major events overnight of this morning.  She denies dyspnea, chest pain, GI or UTI symptoms.  Reports intermittent productive cough.  Objective: Vitals:   10/31/19 0250 10/31/19 0457 10/31/19 0900 10/31/19 1241  BP: (!) 157/60 (!) 166/69 (!) 166/82 (!) 161/70  Pulse: (!) 59 (!) 54 65 67  Resp: 16 18 18 19   Temp: 97.6 F (36.4 C) 97.8 F (36.6 C) 97.6 F (36.4 C) 98.7 F (37.1 C)  TempSrc: Oral Oral Oral Oral  SpO2: 95% 94% 96% 92%  Weight:      Height:        Intake/Output Summary (Last 24 hours) at 10/31/2019 1339 Last data filed at 10/31/2019 1123 Gross per 24 hour  Intake 737.5 ml  Output 1400 ml  Net -662.5 ml   Filed Weights   10/30/19 1954  Weight: 52.6 kg    Examination:  GENERAL: No acute distress.  Appears well.  HEENT: MMM.  Vision and hearing grossly intact.  NECK: Supple.  No  apparent JVD.  RESP:  No IWOB.  Fair aeration bilaterally. CVS:  RRR. Heart sounds normal.  ABD/GI/GU: Bowel sounds present. Soft. Non tender.  MSK/EXT:  No apparent deformity or edema. Moves extremities. SKIN: no apparent skin lesion or wound NEURO: Awake, alert and oriented appropriately.  No gross deficit.  PSYCH: Calm. Normal affect.  Procedures:  None  Assessment & Plan: Acute respiratory failure with hypoxia due to COVID-19 pneumonia versus bacterial CAP.  -CTA chest negative for PE but bilateral upper lobe groundglass opacities.  Pro-Cal 0.19> 0.17 -Saturating in low 90s on 1 L by nasal cannula. -Observe off antibiotics -Continue remdesivir and Decadron -Check inflammatory markers. -Inhalers, mucolytic's, antitussive and incentive spirometry -OOB.  Wean oxygen.  Daily ambulation for saturation assessment.  Elevated liver enzymes: Likely due to Covid.  Improved. -Continue monitoring.  Essential hypertension: BP elevated. -Continue home atenolol, losartan and HCTZ. -Added amlodipine.  Anxiety/depression: Stable. -Continue home Celexa -As needed Ativan nightly.   Nutrition Problem: Increased nutrient needs Etiology: acute illness(COVID-19)  Signs/Symptoms: estimated needs  Interventions: Ensure Enlive (each supplement provides 350kcal and 20 grams of protein), Magic cup, MVI   DVT prophylaxis: Subcu Lovenox Code Status: Full code Family Communication: Patient and/or RN. Available if any question. Disposition Plan: Remains inpatient due to ongoing supplemental oxygen requirement.  Consultants: None   Microbiology summarized: COVID-19 positive.  Sch Meds:  Scheduled Meds: . amLODipine  5 mg Oral Daily  .  atenolol  50 mg Oral Daily  . busPIRone  10 mg Oral Daily  . Chlorhexidine Gluconate Cloth  6 each Topical Daily  . citalopram  40 mg Oral Daily  . dexamethasone  6 mg Oral Daily  . enoxaparin (LOVENOX) injection  40 mg Subcutaneous Q24H  . feeding  supplement (ENSURE ENLIVE)  237 mL Oral BID BM  . fluticasone  2 spray Each Nare Daily  . hydrochlorothiazide  12.5 mg Oral Daily  . LORazepam  1 mg Oral QHS  . losartan  100 mg Oral Daily  . multivitamin with minerals   Oral Daily  . potassium chloride  40 mEq Oral Daily  . simvastatin  20 mg Oral q1800   Continuous Infusions: . remdesivir 100 mg in NS 100 mL 100 mg (10/31/19 0907)   PRN Meds:.acetaminophen, sodium chloride flush  Antimicrobials: Anti-infectives (From admission, onward)   Start     Dose/Rate Route Frequency Ordered Stop   10/31/19 1000  remdesivir 100 mg in sodium chloride 0.9 % 100 mL IVPB     100 mg 200 mL/hr over 30 Minutes Intravenous Daily 10/30/19 1815 11/04/19 0959   10/30/19 1930  cefTRIAXone (ROCEPHIN) 1 g in sodium chloride 0.9 % 100 mL IVPB  Status:  Discontinued     1 g 200 mL/hr over 30 Minutes Intravenous Every 24 hours 10/30/19 1927 10/31/19 0719   10/30/19 1930  azithromycin (ZITHROMAX) tablet 250 mg  Status:  Discontinued     250 mg Oral Daily 10/30/19 1927 10/31/19 0719   10/30/19 1930  remdesivir 200 mg in sodium chloride 0.9% 250 mL IVPB     200 mg 580 mL/hr over 30 Minutes Intravenous Once 10/30/19 1815 10/30/19 2053       I have personally reviewed the following labs and images: CBC: Recent Labs  Lab 10/30/19 1058 10/31/19 0500  WBC 9.0 4.4  NEUTROABS 7.5  --   HGB 11.8* 10.5*  HCT 35.8* 31.8*  MCV 98.9 99.1  PLT 253 243   BMP &GFR Recent Labs  Lab 10/30/19 1058 10/31/19 0500  NA 137 139  K 3.5 3.9  CL 97* 103  CO2 27 27  GLUCOSE 99 133*  BUN 18 15  CREATININE 0.87 0.71  CALCIUM 8.9 8.3*  MG  --  1.8   Estimated Creatinine Clearance: 40.7 mL/min (by C-G formula based on SCr of 0.71 mg/dL). Liver & Pancreas: Recent Labs  Lab 10/30/19 1058 10/31/19 0500  AST 55* 48*  ALT 38 35  ALKPHOS 71 62  BILITOT 0.5 0.7  PROT 6.7 5.9*  ALBUMIN 3.2* 2.8*   No results for input(s): LIPASE, AMYLASE in the last 168  hours. No results for input(s): AMMONIA in the last 168 hours. Diabetic: No results for input(s): HGBA1C in the last 72 hours. No results for input(s): GLUCAP in the last 168 hours. Cardiac Enzymes: No results for input(s): CKTOTAL, CKMB, CKMBINDEX, TROPONINI in the last 168 hours. No results for input(s): PROBNP in the last 8760 hours. Coagulation Profile: No results for input(s): INR, PROTIME in the last 168 hours. Thyroid Function Tests: No results for input(s): TSH, T4TOTAL, FREET4, T3FREE, THYROIDAB in the last 72 hours. Lipid Profile: No results for input(s): CHOL, HDL, LDLCALC, TRIG, CHOLHDL, LDLDIRECT in the last 72 hours. Anemia Panel: No results for input(s): VITAMINB12, FOLATE, FERRITIN, TIBC, IRON, RETICCTPCT in the last 72 hours. Urine analysis:    Component Value Date/Time   COLORURINE YELLOW 10/30/2019 [employer] 10/30/2019 1049  LABSPEC 1.028 10/30/2019 1049   PHURINE 5.0 10/30/2019 1049   GLUCOSEU NEGATIVE 10/30/2019 1049   HGBUR SMALL (A) 10/30/2019 1049   BILIRUBINUR NEGATIVE 10/30/2019 1049   KETONESUR 20 (A) 10/30/2019 1049   PROTEINUR NEGATIVE 10/30/2019 1049   NITRITE NEGATIVE 10/30/2019 1049   LEUKOCYTESUR NEGATIVE 10/30/2019 1049   Sepsis Labs: Invalid input(s): PROCALCITONIN, [place]  Microbiology: Recent Results (from the past 240 hour(s))  SARS CORONAVIRUS 2 (TAT 6-24 HRS) Nasopharyngeal Nasopharyngeal Swab     Status: Abnormal   Collection Time: 10/30/19  6:18 PM   Specimen: Nasopharyngeal Swab  Result Value Ref Range Status   SARS Coronavirus 2 POSITIVE (A) NEGATIVE Final    Comment: RESULT CALLED TO, READ BACK BY AND VERIFIED WITH: I ORA,RN 0121 10/31/2019 D BRADLEY (NOTE) SARS-CoV-2 target nucleic acids are DETECTED. The SARS-CoV-2 RNA is generally detectable in upper and lower respiratory specimens during the acute phase of infection. Positive results are indicative of the presence of SARS-CoV-2 RNA.  Clinical correlation with patient history and other diagnostic information is  necessary to determine patient infection status. Positive results do not rule out bacterial infection or co-infection with other viruses.  The expected result is Negative. Fact Sheet for Patients: SugarRoll.be Fact Sheet for Healthcare Providers: https://www.woods-mathews.com/ This test is not yet approved or cleared by the [place] FDA and  has been authorized for detection and/or diagnosis of SARS-CoV-2 by FDA under an Emergency Use Authorization (EUA). This EUA will remain  in effect (meaning this test can be used) for the  duration of the COVID-19 declaration under Section 564(b)(1) of the Act, 21 U.S.C. section 360bbb-3(b)(1), unless the authorization is terminated or revoked sooner. Performed at [facility], [place] [address] [place], [place] [address]   Expectorated sputum assessment w rflx to resp cult     Status: None   Collection Time: 10/30/19  8:32 PM   Specimen: Sputum  Result Value Ref Range Status   Specimen Description SPU EXPECTORATED  Final   Special Requests NONE  Final   Sputum evaluation   Final    THIS SPECIMEN IS ACCEPTABLE FOR SPUTUM CULTURE Performed at [facility], [place] [address], [place], [place] [address]    Report Status 10/30/2019 FINAL  Final  Culture, respiratory     Status: None (Preliminary result)   Collection Time: 10/30/19  8:32 PM   Specimen: Sputum  Result Value Ref Range Status   Specimen Description   Final    SPU EXPECTORATED Performed at [place] [address], [place], [place] [address]    Special Requests   Final    NONE Reflexed from [id number] Performed at [place] [address] [place], [place] [address]    Gram Stain   Final    RARE WBC PRESENT, PREDOMINANTLY PMN FEW GRAM POSITIVE COCCI IN PAIRS    Culture   Final    CULTURE  REINCUBATED FOR BETTER GROWTH Performed at [facility], [place] [address], [place], [place] [address]    Report Status PENDING  Incomplete    Radiology Studies: CT ANGIO CHEST PE W OR WO CONTRAST  Result Date: 10/30/2019 CLINICAL DATA:  Decreased oxygen saturation. Cough. Negative COVID-19 test today. EXAM: CT ANGIOGRAPHY CHEST WITH CONTRAST TECHNIQUE: Multidetector CT imaging of the chest was performed using the standard protocol during bolus administration of intravenous contrast. Multiplanar CT image reconstructions and MIPs were obtained to evaluate the vascular anatomy. CONTRAST:  100 mL  OMNIPAQUE IOHEXOL 350 MG/ML SOLN COMPARISON:  Single-view of the chest 10/30/2019. PA and lateral chest 02/17/2017. FINDINGS: Cardiovascular: No pulmonary embolus is identified. Heart size is upper normal. No pericardial effusion. Calcific aortic and coronary atherosclerosis noted. Mediastinum/Nodes: No enlarged mediastinal, hilar, or axillary lymph nodes. Thyroid gland, trachea, and esophagus demonstrate no significant findings. Large hiatal hernia is identified. Lungs/Pleura: No pleural effusion. Patchy areas of ground-glass opacity are seen in the upper lobes bilaterally. There is also dependent bilateral ground-glass opacity. Upper Abdomen: Negative. Musculoskeletal: No acute or focal abnormality. Review of the MIP images confirms the above findings. IMPRESSION: Negative for pulmonary embolus. Patchy areas of ground-glass opacity in the upper lobes bilaterally are worrisome for pneumonia including atypical/viral infection. Bilateral dependent ground-glass opacity could also reflect pneumonia but may also be due to atelectasis. Large hiatal hernia. Aortic Atherosclerosis (ICD10-I70.0). Calcific coronary artery disease also noted. Electronically Signed   By: Inge Rise M.D.   On: 10/30/2019 18:38     Armella Stogner T. Bentley  If 7PM-7AM, please contact night-coverage [email or web address] Password  [email or web address] 10/31/2019, 1:39 PM

## 2019-11-01 DIAGNOSIS — R1310 Dysphagia, unspecified: Secondary | ICD-10-CM

## 2019-11-01 LAB — CBC
HCT: 31.5 % — ABNORMAL LOW (ref 36.0–46.0)
Hemoglobin: 10.4 g/dL — ABNORMAL LOW (ref 12.0–15.0)
MCH: 32.4 pg (ref 26.0–34.0)
MCHC: 33 g/dL (ref 30.0–36.0)
MCV: 98.1 fL (ref 80.0–100.0)
Platelets: 308 10*3/uL (ref 150–400)
RBC: 3.21 MIL/uL — ABNORMAL LOW (ref 3.87–5.11)
RDW: 12.1 % (ref 11.5–15.5)
WBC: 6.4 10*3/uL (ref 4.0–10.5)
nRBC: 0 % (ref 0.0–0.2)

## 2019-11-01 LAB — PREPARE FRESH FROZEN PLASMA: Unit division: 0

## 2019-11-01 LAB — COMPREHENSIVE METABOLIC PANEL
ALT: 40 U/L (ref 0–44)
AST: 54 U/L — ABNORMAL HIGH (ref 15–41)
Albumin: 2.6 g/dL — ABNORMAL LOW (ref 3.5–5.0)
Alkaline Phosphatase: 56 U/L (ref 38–126)
Anion gap: 8 (ref 5–15)
BUN: 27 mg/dL — ABNORMAL HIGH (ref 8–23)
CO2: 27 mmol/L (ref 22–32)
Calcium: 8.4 mg/dL — ABNORMAL LOW (ref 8.9–10.3)
Chloride: 103 mmol/L (ref 98–111)
Creatinine, Ser: 0.78 mg/dL (ref 0.44–1.00)
GFR calc Af Amer: >60 mL/min (ref >60)
GFR calc non Af Amer: >60 mL/min (ref >60)
Glucose, Bld: 157 mg/dL — ABNORMAL HIGH (ref 70–99)
Potassium: 3.6 mmol/L (ref 3.5–5.1)
Sodium: 138 mmol/L (ref 135–145)
Total Bilirubin: 0.7 mg/dL (ref 0.3–1.2)
Total Protein: 5.6 g/dL — ABNORMAL LOW (ref 6.5–8.1)

## 2019-11-01 LAB — C-REACTIVE PROTEIN: CRP: 7.5 mg/dL — ABNORMAL HIGH (ref <1.0)

## 2019-11-01 LAB — BPAM FFP
Blood Product Expiration Date: 202012162052
Blood Product Expiration Date: 202012162350
ISSUE DATE / TIME: 202012152339
ISSUE DATE / TIME: 202012160037
Unit Type and Rh: 5100
Unit Type and Rh: 9500

## 2019-11-01 LAB — PROCALCITONIN: Procalcitonin: <0.1 ng/mL

## 2019-11-01 LAB — FERRITIN: Ferritin: 321 ng/mL — ABNORMAL HIGH (ref 11–307)

## 2019-11-01 LAB — LACTATE DEHYDROGENASE: LDH: 273 U/L — ABNORMAL HIGH (ref 98–192)

## 2019-11-01 LAB — D-DIMER, QUANTITATIVE: D-Dimer, Quant: 0.82 ug/mL-FEU — ABNORMAL HIGH (ref 0.00–0.50)

## 2019-11-01 MED ORDER — LIP MEDEX EX OINT
TOPICAL_OINTMENT | CUTANEOUS | Status: AC
  Filled 2019-11-01: qty 7

## 2019-11-01 MED ORDER — BUSPIRONE HCL 5 MG PO TABS
7.5000 mg | ORAL_TABLET | Freq: Two times a day (BID) | ORAL | Status: DC
  Administered 2019-11-01 – 2019-11-04 (×6): 7.5 mg via ORAL
  Filled 2019-11-01 (×6): qty 2

## 2019-11-01 MED ORDER — CLONAZEPAM 0.5 MG PO TABS
0.5000 mg | ORAL_TABLET | Freq: Two times a day (BID) | ORAL | Status: DC
  Administered 2019-11-01 – 2019-11-02 (×3): 0.5 mg via ORAL
  Filled 2019-11-01 (×3): qty 1

## 2019-11-01 NOTE — Evaluation (Addendum)
Clinical/Bedside Swallow Evaluation Patient Details  Name: Brenda Patrick MRN: [id number] Date of Birth: 1932/01/01  Today's Date: 11/01/2019 Time: SLP Start Time (ACUTE ONLY): [id number] SLP Stop Time (ACUTE ONLY): 1605 SLP Time Calculation (min) (ACUTE ONLY): 26 min  Past Medical History:  Past Medical History:  Diagnosis Date  . Hypertension    Past Surgical History: History reviewed. No pertinent surgical history. HPI:  85 yo female adm to [facility] diagnosed with COVID +19.  Pt with PMH + for depression, anxiety, HTN.  Pt CXR showed ground glass opacity = RUL and LUL.  Pt reportedly with dysphagia - to foods, which she states is worse in the am - and she atrributes this to stress.  Intake and swallow better them pm per pt.  She does have hiatal hernia and admits to h/o GERD but she is no on a PPI at this time. Took a PPI preiously however.   Assessment / Plan / Recommendation Clinical Impression  Patient presents with negative CN exam and no indication of aspiration/laryngeal penetration with all po observed including graham crackers, 3 ounces applesauce and 4 ounces of water. She easily passed [facility] screen- demonstrating ability to swallow 3 ounces of water sequentially without coughing therefore covering threshhold for silent aspiration.  Pt does have a hiatal hernia and admits to sensing food lodging in her esophagus at times. SLP provided her with compensation strategies and advised she talk to primary care MD upon DC and recovery from [facility] if her dysphagia does not improve.  Advised pt eat small frequent meals, start meals with liquids and stay upright after meals to help gravity clear esophagus.  Using teach back, pt educated.   SLP Visit Diagnosis: Dysphagia, unspecified (R13.10)    Aspiration Risk  Mild aspiration risk    Diet Recommendation Regular;Thin liquid   Liquid Administration via: Straw Medication Administration: Whole meds with liquid Supervision: Patient able to self  feed Compensations: Slow rate;Small sips/bites(start meals with liquids) Postural Changes: Seated upright at 90 degrees    Other  Recommendations     Follow up Recommendations None      Frequency and Duration            Prognosis        Swallow Study   General Date of Onset: 11/01/19 HPI: 85 yo female adm to [facility] diagnosed with COVID +19.  Pt with PMH + for depression, anxiety, HTN.  Pt CXR showed ground glass opacity = RUL and LUL.  Pt reportedly with dysphagia - to foods, which she states is worse in the am - and she atrributes this to stress.  Intake and swallow better them pm per pt.  She does have hiatal hernia and admits to h/o GERD but she is no on a PPI at this time. Took a PPI preiously however. Type of Study: Bedside Swallow Evaluation Diet Prior to this Study: Regular;Thin liquids Temperature Spikes Noted: No Respiratory Status: Room air(2 liters at night) History of Recent Intubation: No Behavior/Cognition: Alert;Cooperative;Pleasant mood Oral Cavity Assessment: Within Functional Limits Oral Cavity - Dentition: Adequate natural dentition;Other (Comment)(has partial she put in for po intake) Vision: Functional for self-feeding Self-Feeding Abilities: Able to feed self Patient Positioning: Upright in bed Baseline Vocal Quality: Normal Volitional Cough: Strong Volitional Swallow: Able to elicit    Oral/Motor/Sensory Function     Ice Chips Ice chips: Not tested   Thin Liquid Thin Liquid: Within functional limits Presentation: Cup Other Comments: pt easily passed 3 ounce [employer]  Nectar Thick Nectar Thick Liquid: Not tested   Honey Thick Honey Thick Liquid: Not tested   Puree Puree: Within functional limits Presentation: Self Fed;Spoon   Solid     Solid: Within functional limits Presentation: Self Fed      Brenda Patrick 11/01/2019,4:40 PM  Kathleen Lime, MS [place] Office [phone]

## 2019-11-01 NOTE — Progress Notes (Signed)
Pt Facetimed with niece and nephew regarding husbands current condition at [facility]. Consulted chaplain. Pt still anxious and tearful. VSS. SpO2 100% on room air. No other concerns at this time. Will cont to mx.

## 2019-11-01 NOTE — Progress Notes (Signed)
   11/01/19 1200  Clinical Encounter Type  Visited With Patient  Visit Type Initial;Psychological support;Spiritual support  Referral From Nurse  Consult/Referral To Chaplain  Spiritual Encounters  Spiritual Needs Prayer;Emotional;Other (Comment) (Spiritual Care Conversaiton/Support)  Stress Factors  Patient Stress Factors Health changes;Major life changes   I visited with Brenda Patrick per [place]. She stated that her husband is over at [facility] with Covid and that this is causing her great sadness. Brenda Patrick is concerned about her husband's health and requested that I pray for him to be healed.  I prayed with her at the bedside and provided emotional support.   Please, contact Spiritual Care for further assistance.  Chaplain Shanon Ace M.Div., [facility]

## 2019-11-01 NOTE — Progress Notes (Signed)
PROGRESS NOTE  Brenda Patrick [id number] DOB: Apr 24, 1932   PCP: Lillard Anes, MD  Patient is from: Home.  Independently ambulates at baseline.  DOA: 10/30/2019 LOS: 2  Brief Narrative / Interim history: 85 year old female with history of HTN, anxiety and depression presented with worsening shortness of breath, fatigue, productive cough and fever.  Tested positive for COVID-19 2 weeks prior to our admission.  Husband currently intubated and in ICU at [facility].  In ED, saturating from 88% to 92% on RA.  Slightly tachypneic.  CMP and CBC without significant finding other than AST to 55 and Hgb to 11.8.  High-sensitivity troponin 19.  Pro-Cal 0.19.  UA with 20 ketones.  COVID-19 PCR positive.  CXR without significant finding.  Blood and sputum cultures obtained.  CTA chest ordered.  Started on ceftriaxone, azithromycin, Decadron and remdesivir and admitted for COVID-19 pneumonia and possible bacterial CAP.   CTA chest negative for PE but reveals groundglass opacities in RUL and LUL.  Ceftriaxone and azithromycin discontinued the next day.  Remained stable.  Subjective: No major events overnight of this morning.  Seems very anxious and tearful at times.  Reportedly, her husband is [place] on life support.  Also complains about difficulty swallowing.  Denies chest pain, dyspnea, cough, GI or UTI symptoms.  Niece, [facility] POA and his wife available over the phone during this encounter.  Concern about her anxiety, confusion and swallowing.  Ask about virtual visit, and chaplain service.   Objective: Vitals:   10/31/19 2214 11/01/19 0322 11/01/19 0545 11/01/19 1303  BP: (!) 161/71  (!) 152/67 127/61  Pulse: 70  68 68  Resp: 18  18   Temp: 98.9 F (37.2 C) 98.4 F (36.9 C) 98 F (36.7 C) 98.2 F (36.8 C)  TempSrc: Oral Oral Oral Oral  SpO2: 92% 94% 98% 100%  Weight:      Height:        Intake/Output Summary (Last 24 hours) at 11/01/2019 1447 Last data filed at 11/01/2019  1100 Gross per 24 hour  Intake 120 ml  Output 1000 ml  Net -880 ml   Filed Weights   10/30/19 1954  Weight: 52.6 kg    Examination:  GENERAL: No apparent distress.  Nontoxic.  Very anxious. HEENT: MMM.  Vision and hearing grossly intact.  NECK: Supple.  No apparent JVD.  RESP:  No IWOB.  Fair aeration bilaterally. CVS:  RRR. Heart sounds normal.  ABD/GI/GU: Bowel sounds present. Soft. Non tender.  MSK/EXT:  Moves extremities. No apparent deformity or edema.  SKIN: no apparent skin lesion or wound NEURO: Awake, alert and oriented appropriately.  No apparent focal neuro deficit. PSYCH: Very anxious.  Procedures:  None  Assessment & Plan: Acute respiratory failure with hypoxia due to COVID-19 pneumonia versus bacterial CAP. CTA chest negative for PE but bilateral upper lobe groundglass opacities.  Pro-Cal 0.19> 0.17> 0.10. Saturating in upper 90s on room air.  Inflammatory markers elevated but downtrending. -Continue remdesivir and Decadron -Monitor inflammatory markers -Inhalers, mucolytic's, antitussive and incentive spirometry -OOB.  Wean oxygen.  Daily ambulation for saturation assessment. Recent Labs    10/31/19 1407 10/31/19 1530 11/01/19 0313  DDIMER  --  1.09* 0.82*  FERRITIN 319*  --  321*  LDH 318*  --  273*  CRP 12.9*  --  7.5*    Elevated liver enzymes: Likely due to COVID-19 infection and remdesivir.  Stable. -Continue monitoring.  Essential hypertension: BP elevated. -Continue home atenolol, losartan and HCTZ. -Continue  amlodipine.  Anxiety/depression: she is very anxious today partly due to her husband's condition who is [facility] on life support with the plan for thrombin alcohol in the next couple of days.  Steroid could exacerbate her anxiety as well. -Scheduled Klonopin 0.1 mg twice daily-will adjust based on response. -Increase BuSpar to 7.5 mg twice daily -Continue home Celexa -Frequent reorientation and delirium precautions. -RN to  facilitate virtual visit with family and chaplain. -Palliative care consulted-planning to facilitate visit to her husband at [facility] before terminal wean from life support.  Dysphagia: Reports difficulty swallowing solids. -Requested SLP eval   Nutrition Problem: Increased nutrient needs Etiology: acute illness(COVID-19)  Signs/Symptoms: estimated needs  Interventions: Ensure Enlive (each supplement provides 350kcal and 20 grams of protein), Magic cup, MVI   DVT prophylaxis: Subcu Lovenox Code Status: Full code Family Communication: Updated patient's niece, Mr. Stobaugh [facility] POA and his wife over the phone. Disposition Plan: Remains inpatient.  Very high risk for deconditioning.  Patient lives alone Consultants: None   Microbiology summarized: [id number] positive.  Sch Meds:  Scheduled Meds: . amLODipine  5 mg Oral Daily  . atenolol  50 mg Oral Daily  . busPIRone  7.5 mg Oral BID  . Chlorhexidine Gluconate Cloth  6 each Topical Daily  . citalopram  40 mg Oral Daily  . clonazePAM  0.5 mg Oral BID  . dexamethasone  6 mg Oral Daily  . enoxaparin (LOVENOX) injection  40 mg Subcutaneous Q24H  . feeding supplement (ENSURE ENLIVE)  237 mL Oral BID BM  . fluticasone  2 spray Each Nare Daily  . hydrochlorothiazide  12.5 mg Oral Daily  . losartan  100 mg Oral Daily  . multivitamin with minerals   Oral Daily  . simvastatin  20 mg Oral q1800   Continuous Infusions: . remdesivir 100 mg in NS 100 mL 100 mg (11/01/19 0826)   PRN Meds:.acetaminophen, sodium chloride flush  Antimicrobials: Anti-infectives (From admission, onward)   Start     Dose/Rate Route Frequency Ordered Stop   10/31/19 1000  remdesivir 100 mg in sodium chloride 0.9 % 100 mL IVPB     100 mg 200 mL/hr over 30 Minutes Intravenous Daily 10/30/19 1815 11/04/19 0959   10/30/19 1930  cefTRIAXone (ROCEPHIN) 1 g in sodium chloride 0.9 % 100 mL IVPB  Status:  Discontinued     1 g 200 mL/hr over 30 Minutes Intravenous Every 24  hours 10/30/19 1927 10/31/19 0719   10/30/19 1930  azithromycin (ZITHROMAX) tablet 250 mg  Status:  Discontinued     250 mg Oral Daily 10/30/19 1927 10/31/19 0719   10/30/19 1930  remdesivir 200 mg in sodium chloride 0.9% 250 mL IVPB     200 mg 580 mL/hr over 30 Minutes Intravenous Once 10/30/19 1815 10/30/19 2053       I have personally reviewed the following labs and images: CBC: Recent Labs  Lab 10/30/19 1058 10/31/19 0500 11/01/19 0313  WBC 9.0 4.4 6.4  NEUTROABS 7.5  --   --   HGB 11.8* 10.5* 10.4*  HCT 35.8* 31.8* 31.5*  MCV 98.9 99.1 98.1  PLT 253 243 308   BMP &GFR Recent Labs  Lab 10/30/19 1058 10/31/19 0500 11/01/19 0313  NA 137 139 138  K 3.5 3.9 3.6  CL 97* 103 103  CO2 27 27 27   GLUCOSE 99 133* 157*  BUN 18 15 27*  CREATININE 0.87 0.71 0.78  CALCIUM 8.9 8.3* 8.4*  MG  --  1.8  --  Estimated Creatinine Clearance: 40.7 mL/min (by C-G formula based on SCr of 0.78 mg/dL). Liver & Pancreas: Recent Labs  Lab 10/30/19 1058 10/31/19 0500 11/01/19 0313  AST 55* 48* 54*  ALT 38 35 40  ALKPHOS 71 62 56  BILITOT 0.5 0.7 0.7  PROT 6.7 5.9* 5.6*  ALBUMIN 3.2* 2.8* 2.6*   No results for input(s): LIPASE, AMYLASE in the last 168 hours. No results for input(s): AMMONIA in the last 168 hours. Diabetic: No results for input(s): HGBA1C in the last 72 hours. No results for input(s): GLUCAP in the last 168 hours. Cardiac Enzymes: Recent Labs  Lab 10/31/19 1825  CKTOTAL 43  CKMB 4.1   No results for input(s): PROBNP in the last 8760 hours. Coagulation Profile: No results for input(s): INR, PROTIME in the last 168 hours. Thyroid Function Tests: No results for input(s): TSH, T4TOTAL, FREET4, T3FREE, THYROIDAB in the last 72 hours. Lipid Profile: No results for input(s): CHOL, HDL, LDLCALC, TRIG, CHOLHDL, LDLDIRECT in the last 72 hours. Anemia Panel: Recent Labs    10/31/19 1407 11/01/19 0313  FERRITIN 319* 321*   Urine analysis:    Component  Value Date/Time   COLORURINE YELLOW 10/30/2019 [place] 10/30/2019 1049   LABSPEC 1.028 10/30/2019 1049   PHURINE 5.0 10/30/2019 1049   GLUCOSEU NEGATIVE 10/30/2019 1049   HGBUR SMALL (A) 10/30/2019 1049   BILIRUBINUR NEGATIVE 10/30/2019 1049   KETONESUR 20 (A) 10/30/2019 1049   PROTEINUR NEGATIVE 10/30/2019 1049   NITRITE NEGATIVE 10/30/2019 1049   LEUKOCYTESUR NEGATIVE 10/30/2019 1049   Sepsis Labs: Invalid input(s): PROCALCITONIN, Onward  Microbiology: Recent Results (from the past 240 hour(s))  SARS CORONAVIRUS 2 (TAT 6-24 HRS) Nasopharyngeal Nasopharyngeal Swab     Status: Abnormal   Collection Time: 10/30/19  6:18 PM   Specimen: Nasopharyngeal Swab  Result Value Ref Range Status   SARS Coronavirus 2 POSITIVE (A) NEGATIVE Final    Comment: RESULT CALLED TO, READ BACK BY AND VERIFIED WITH: I ORA,RN 0121 10/31/2019 D BRADLEY (NOTE) SARS-CoV-2 target nucleic acids are DETECTED. The SARS-CoV-2 RNA is generally detectable in upper and lower respiratory specimens during the acute phase of infection. Positive results are indicative of the presence of SARS-CoV-2 RNA. Clinical correlation with patient history and other diagnostic information is  necessary to determine patient infection status. Positive results do not rule out bacterial infection or co-infection with other viruses.  The expected result is Negative. Fact Sheet for Patients: SugarRoll.be Fact Sheet for Healthcare Providers: https://www.woods-mathews.com/ This test is not yet approved or cleared by the [place] FDA and  has been authorized for detection and/or diagnosis of SARS-CoV-2 by FDA under an Emergency Use Authorization (EUA). This EUA will remain  in effect (meaning this test can be used) for the  duration of the COVID-19 declaration under Section 564(b)(1) of the Act, 21 U.S.C. section 360bbb-3(b)(1), unless the authorization is terminated  or revoked sooner. Performed at [facility], [place] [address], [place], [place] [address]   Expectorated sputum assessment w rflx to resp cult     Status: None   Collection Time: 10/30/19  8:32 PM   Specimen: Sputum  Result Value Ref Range Status   Specimen Description SPU EXPECTORATED  Final   Special Requests NONE  Final   Sputum evaluation   Final    THIS SPECIMEN IS ACCEPTABLE FOR SPUTUM CULTURE Performed at [facility], [place] [address], [place], [place] [address]    Report Status 10/30/2019  FINAL  Final  Culture, respiratory     Status: None (Preliminary result)   Collection Time: 10/30/19  8:32 PM   Specimen: Sputum  Result Value Ref Range Status   Specimen Description   Final    SPU EXPECTORATED Performed at [place] [address], [place], [place] [address]    Special Requests   Final    NONE Reflexed from [id number] Performed at [place] [address] [place], [place] [address]    Gram Stain   Final    RARE WBC PRESENT, PREDOMINANTLY PMN FEW GRAM POSITIVE COCCI IN PAIRS    Culture   Final    CULTURE REINCUBATED FOR BETTER GROWTH Performed at [facility], [place] [address] [place], [place] [address]    Report Status PENDING  Incomplete    Radiology Studies: No results found.   Chaylee Ehrsam T. [place]  If 7PM-7AM, please contact night-coverage [email or web address] Password [facility] 11/01/2019, 2:47 PM

## 2019-11-02 LAB — COMPREHENSIVE METABOLIC PANEL
ALT: 49 U/L — ABNORMAL HIGH (ref 0–44)
AST: 55 U/L — ABNORMAL HIGH (ref 15–41)
Albumin: 3.1 g/dL — ABNORMAL LOW (ref 3.5–5.0)
Alkaline Phosphatase: 62 U/L (ref 38–126)
Anion gap: 9 (ref 5–15)
BUN: 29 mg/dL — ABNORMAL HIGH (ref 8–23)
CO2: 26 mmol/L (ref 22–32)
Calcium: 8.9 mg/dL (ref 8.9–10.3)
Chloride: 104 mmol/L (ref 98–111)
Creatinine, Ser: 0.65 mg/dL (ref 0.44–1.00)
GFR calc Af Amer: >60 mL/min (ref >60)
GFR calc non Af Amer: >60 mL/min (ref >60)
Glucose, Bld: 149 mg/dL — ABNORMAL HIGH (ref 70–99)
Potassium: 3.6 mmol/L (ref 3.5–5.1)
Sodium: 139 mmol/L (ref 135–145)
Total Bilirubin: 0.7 mg/dL (ref 0.3–1.2)
Total Protein: 6.3 g/dL — ABNORMAL LOW (ref 6.5–8.1)

## 2019-11-02 LAB — FERRITIN: Ferritin: 313 ng/mL — ABNORMAL HIGH (ref 11–307)

## 2019-11-02 LAB — CBC
HCT: 34.5 % — ABNORMAL LOW (ref 36.0–46.0)
Hemoglobin: 11.4 g/dL — ABNORMAL LOW (ref 12.0–15.0)
MCH: 32.9 pg (ref 26.0–34.0)
MCHC: 33 g/dL (ref 30.0–36.0)
MCV: 99.4 fL (ref 80.0–100.0)
Platelets: 408 10*3/uL — ABNORMAL HIGH (ref 150–400)
RBC: 3.47 MIL/uL — ABNORMAL LOW (ref 3.87–5.11)
RDW: 12.3 % (ref 11.5–15.5)
WBC: 10.9 10*3/uL — ABNORMAL HIGH (ref 4.0–10.5)
nRBC: 0 % (ref 0.0–0.2)

## 2019-11-02 LAB — LACTATE DEHYDROGENASE: LDH: 274 U/L — ABNORMAL HIGH (ref 98–192)

## 2019-11-02 LAB — D-DIMER, QUANTITATIVE: D-Dimer, Quant: 1 ug/mL-FEU — ABNORMAL HIGH (ref 0.00–0.50)

## 2019-11-02 LAB — C-REACTIVE PROTEIN: CRP: 4 mg/dL — ABNORMAL HIGH (ref <1.0)

## 2019-11-02 MED ORDER — CLONAZEPAM 1 MG PO TABS: 1.0000 mg | ORAL_TABLET | Freq: Two times a day (BID) | ORAL | Status: DC

## 2019-11-02 MED ORDER — CLONAZEPAM 1 MG PO TABS
1.0000 mg | ORAL_TABLET | Freq: Two times a day (BID) | ORAL | Status: DC
  Administered 2019-11-02 – 2019-11-04 (×4): 1 mg via ORAL
  Filled 2019-11-02 (×4): qty 1

## 2019-11-02 NOTE — Progress Notes (Signed)
   11/02/19 1500  Clinical Encounter Type  Visited With Patient  Visit Type Follow-up;Psychological support;Spiritual support  Referral From Nurse  Consult/Referral To Chaplain  Spiritual Encounters  Spiritual Needs Emotional;Grief support  Stress Factors  Patient Stress Factors Health changes;Loss   I visited with Brenda Patrick to support her through her grief over her husband's declining condition at [facility]. Brenda Patrick has been very tearful, but takes comfort in her spirituality and knowledge that God is with her. Brenda Patrick is comforted by people reminding her that "everything will be ok and that God is in control." I will be bringing her a prayer shawl to bring her comfort. This can be taken home and washed when she is discharged.  Brenda Patrick is looking forward to going home and being in the comfort of her own space and surrounded by family and friends. She has a very good support system around her. We will continue to provide support for her.   Please, contact Spiritual Care for further assistance. If it is over the weekend, our on-call Chaplain can be reached at pager [phone].   Chaplain Shanon Ace M.Div., [facility]

## 2019-11-02 NOTE — Evaluation (Signed)
Occupational Therapy Evaluation Patient Details Name: Brenda Patrick MRN: [id number] DOB: [birth year] Today's Date: 11/02/2019    History of Present Illness Brenda Patrick is a 85 y.o. female with medical history significant of hypertension anxiety and depression came to [facility] room with complaints of worsening fatigue and shortness of breath along with cough over past few days.  Patient mentioned that she and her husband were diagnosed with COVID-19 infection about 2 weeks ago at an outside facility.PHMx:HTN, anxiety and depression   Clinical Impression   This 85 yo female admitted with above presents to acute OT with mild balance deficit (without AD) but open to using one and generalized weakness--affecting her safety and independence with basic ADLs. Pt really wanting to go home at discharge and feels she will be able to manage as long as she uses an AD at least initially and receives [facility] services.     Follow Up Recommendations  Home health OT;Supervision - Intermittent    Equipment Recommendations  None recommended by OT       Precautions / Restrictions Precautions Precautions: Fall Precaution Comments: COVID + (diagnosed 2 weeks ago) Required Braces or Orthoses: Sling Restrictions Weight Bearing Restrictions: Yes RUE Weight Bearing: Non weight bearing Other Position/Activity Restrictions: [place] and broke something at top part of shoulder joint first Saturday in November. Has sling in room but does not want to use it.      Mobility Bed Mobility Overal bed mobility: Modified Independent             General bed mobility comments: HOB up and increased time  Transfers Overall transfer level: Needs assistance Equipment used: 1 person hand held assist Transfers: Sit to/from Stand Sit to Stand: Min guard              Balance Overall balance assessment: Needs assistance Sitting-balance support: No upper extremity supported;Feet  supported Sitting balance-Leahy Scale: Good     Standing balance support: Single extremity supported Standing balance-Leahy Scale: Fair                             ADL either performed or assessed with clinical judgement   ADL Overall ADL's : Needs assistance/impaired Eating/Feeding: Independent   Grooming: Min guard;Standing;Wash/dry hands   Upper Body Bathing: Set up;Supervision/ safety   Lower Body Bathing: Min guard;Sit to/from stand   Upper Body Dressing : Set up;Supervision/safety;Sitting   Lower Body Dressing: Min guard;Sit to/from stand   Toilet Transfer: Minimal assistance;Ambulation   Toileting- Clothing Manipulation and Hygiene: Min guard;Sit to/from stand               Vision Patient Visual Report: No change from baseline              Pertinent Vitals/Pain Pain Assessment: Faces Faces Pain Scale: Hurts a little bit Pain Location: right shoulder Pain Descriptors / Indicators: Aching;Sore Pain Intervention(s): Limited activity within patient's tolerance;Monitored during session     Hand Dominance Right   Extremity/Trunk Assessment Upper Extremity Assessment Upper Extremity Assessment: RUE deficits/detail RUE Deficits / Details: [place] [address] the first Saturday in November and broke something at top of her shoulder (she pointed to top of joint), no notes in chart to determine exactly what happened. Pt is using her arm somewhat functionally but it is limited. She has a sling in room but says she really does not want to use it. RUE Coordination: decreased gross motor  Communication Communication Communication: No difficulties   Cognition Arousal/Alertness: Awake/alert Behavior During Therapy: WFL for tasks assessed/performed Overall Cognitive Status: Within Functional Limits for tasks assessed                                                Home Living Family/patient expects to be discharged to:: Private  residence Living Arrangements: Spouse/significant other(however patient's husband is at [facility] on vent) Available Help at Discharge: Friend(s);Available PRN/intermittently               Bathroom Shower/Tub: Tub/shower unit(sponge baths as of recent due to hurt right shoulder)   Bathroom Toilet: Standard     Home Equipment: None          Prior Functioning/Environment          Comments: Mod Independent since fell and injured (broke her shoulder the first Saturday in November); uses LUE mostly, but has been using RUE more lately        OT Problem List: Decreased range of motion;Impaired balance (sitting and/or standing);Pain      OT Treatment/Interventions: Self-care/ADL training;DME and/or AE instruction;Patient/family education;Balance training    OT Goals(Current goals can be found in the care plan section) Acute Rehab OT Goals Patient Stated Goal: to be able to go home OT Goal Formulation: With patient Time For Goal Achievement: 11/16/19 Potential to Achieve Goals: Good  OT Frequency: Min 2X/week              AM-PAC OT "6 Clicks" Daily Activity     Outcome Measure Help from another person eating meals?: None Help from another person taking care of personal grooming?: A Little Help from another person toileting, which includes using toliet, bedpan, or urinal?: A Little Help from another person bathing (including washing, rinsing, drying)?: A Little Help from another person to put on and taking off regular upper body clothing?: A Little Help from another person to put on and taking off regular lower body clothing?: A Little 6 Click Score: 19   End of Session Equipment Utilized During Treatment: Gait belt  Activity Tolerance: Patient tolerated treatment well Patient left: in chair;with call bell/phone within reach  OT Visit Diagnosis: Unsteadiness on feet (R26.81);Pain Pain - Right/Left: Right Pain - part of body: Shoulder                Time: 1445-1501 OT  Time Calculation (min): 16 min Charges:  OT General Charges $OT Visit: 1 Visit OT Evaluation $OT Eval Moderate Complexity: 1 Mod  [address], OTR/L Acute [employer] Pager [phone] Office [phone]     Almon Register 11/02/2019, 4:10 PM

## 2019-11-02 NOTE — Progress Notes (Signed)
PROGRESS NOTE  Brenda Patrick [id number] DOB: 01/23/1932   PCP: Lillard Anes, MD  Patient is from: Home.  Independently ambulates at baseline.  DOA: 10/30/2019 LOS: 3  Brief Narrative / Interim history: 85 year old female with history of HTN, anxiety and depression presented with worsening shortness of breath, fatigue, productive cough and fever.  Tested positive for COVID-19 2 weeks prior to our admission.  Husband currently intubated and in ICU at [facility].  In ED, saturating from 88% to 92% on RA.  Slightly tachypneic.  CMP and CBC without significant finding other than AST to 55 and Hgb to 11.8.  High-sensitivity troponin 19.  Pro-Cal 0.19.  UA with 20 ketones.  COVID-19 PCR positive.  CXR without significant finding.  Blood and sputum cultures obtained.  CTA chest ordered.  Started on ceftriaxone, azithromycin, Decadron and remdesivir and admitted for COVID-19 pneumonia and possible bacterial CAP.   CTA chest negative for PE but reveals groundglass opacities in RUL and LUL.  Ceftriaxone and azithromycin discontinued the next day.  Continued on remdesivir and Decadron.  Remained stable from Covid infection standpoint.  Subjective: No major events overnight of this morning.  She says she had a better night last night.  No complaint this morning.  Swallowing issue improved.  She denies chest pain, dyspnea, GI or UTI symptoms.  Reports mild intermittent cough.  She says she is anxious and worried about her husband who is actively dying at [facility].  Objective: Vitals:   11/01/19 0545 11/01/19 1303 11/01/19 1948 11/02/19 0502  BP: (!) 152/67 127/61 (!) 162/68 (!) 144/65  Pulse: 68 68 69 70  Resp: 18  16 16   Temp: 98 F (36.7 C) 98.2 F (36.8 C) 98.6 F (37 C) 98.4 F (36.9 C)  TempSrc: Oral Oral Oral Oral  SpO2: 98% 100% 99% 94%  Weight:      Height:        Intake/Output Summary (Last 24 hours) at 11/02/2019 1410 Last data filed at 11/01/2019 2151 Gross per 24  hour  Intake --  Output 200 ml  Net -200 ml   Filed Weights   10/30/19 1954  Weight: 52.6 kg    Examination:  GENERAL: No apparent distress.  Nontoxic.  Somewhat anxious and worried. HEENT: MMM.  Vision and hearing grossly intact.  NECK: Supple.  No apparent JVD.  RESP:  No IWOB. Good air movement bilaterally. CVS:  RRR. Heart sounds normal.  ABD/GI/GU: Bowel sounds present. Soft. Non tender.  MSK/EXT:  Moves extremities. No apparent deformity or edema.  SKIN: no apparent skin lesion or wound NEURO: Awake, alert and oriented appropriately.  No apparent focal neuro deficit. PSYCH: Somewhat anxious and worried.  Procedures:  None  Assessment & Plan: Acute respiratory failure with hypoxia due to COVID-19 pneumonia versus bacterial CAP. CTA chest negative for PE but bilateral upper lobe groundglass opacities.  Pro-Cal 0.19> 0.17> 0.10.  Remains on room air.  Inflammatory markers elevated but downtrending. -Continue remdesivir and Decadron 12/16>> -Monitor inflammatory markers -Inhalers, mucolytic's, antitussive and incentive spirometry -OOB/PT/OT -daily ambulation for saturation assessment. Recent Labs    10/31/19 1407 10/31/19 1530 11/01/19 0313 11/02/19 0500  DDIMER  --  1.09* 0.82* 1.00*  FERRITIN 319*  --  321* 313*  LDH 318*  --  273* 274*  CRP 12.9*  --  7.5* 4.0*   Anxiety: due to her husband who is actively dying at [facility]. Per her husband's attending, there is no point of transfer to [facility] as she  won't able to make it on time.  Overall, anxiety improved with scheduled Klonopin, BuSpar and emotional support. -Continue emotional support -Continue Klonopin 0.5 mg twice daily,  BuSpar 7.5 mg twice daily and Celexa. -Frequent reorientation and delirium precautions. -Palliative care consulted.   Elevated liver enzymes: Likely due to COVID-19 infection and remdesivir.  Stable. -Continue monitoring.  Essential hypertension: BP elevated. -Continue home atenolol, losartan  and HCTZ. -Continue amlodipine.  Dysphagia: Improved. -Appreciate SLP input  Leukocytosis/bandemia: Likely due to steroid. -Continue monitoring   Nutrition Problem: Increased nutrient needs Etiology: acute illness(COVID-19)  Signs/Symptoms: estimated needs  Interventions: Ensure Enlive (each supplement provides 350kcal and 20 grams of protein), Magic cup, MVI   DVT prophylaxis: Subcu Lovenox Code Status: Full code Family Communication: Updated patient's niece, Mr. Mathe [place] and his wife over the phone on 12/17. Disposition Plan: Remains inpatient.  Very high risk for deconditioning.  Patient lives alone Consultants: None   Microbiology summarized: [id number] positive.  Sch Meds:  Scheduled Meds: . amLODipine  5 mg Oral Daily  . atenolol  50 mg Oral Daily  . busPIRone  7.5 mg Oral BID  . Chlorhexidine Gluconate Cloth  6 each Topical Daily  . citalopram  40 mg Oral Daily  . clonazePAM  0.5 mg Oral BID  . dexamethasone  6 mg Oral Daily  . enoxaparin (LOVENOX) injection  40 mg Subcutaneous Q24H  . feeding supplement (ENSURE ENLIVE)  237 mL Oral BID BM  . fluticasone  2 spray Each Nare Daily  . hydrochlorothiazide  12.5 mg Oral Daily  . losartan  100 mg Oral Daily  . multivitamin with minerals   Oral Daily  . simvastatin  20 mg Oral q1800   Continuous Infusions: . remdesivir 100 mg in NS 100 mL 100 mg (11/02/19 0837)   PRN Meds:.acetaminophen, sodium chloride flush  Antimicrobials: Anti-infectives (From admission, onward)   Start     Dose/Rate Route Frequency Ordered Stop   10/31/19 1000  remdesivir 100 mg in sodium chloride 0.9 % 100 mL IVPB     100 mg 200 mL/hr over 30 Minutes Intravenous Daily 10/30/19 1815 11/04/19 0959   10/30/19 1930  cefTRIAXone (ROCEPHIN) 1 g in sodium chloride 0.9 % 100 mL IVPB  Status:  Discontinued     1 g 200 mL/hr over 30 Minutes Intravenous Every 24 hours 10/30/19 1927 10/31/19 0719   10/30/19 1930  azithromycin (ZITHROMAX)  tablet 250 mg  Status:  Discontinued     250 mg Oral Daily 10/30/19 1927 10/31/19 0719   10/30/19 1930  remdesivir 200 mg in sodium chloride 0.9% 250 mL IVPB     200 mg 580 mL/hr over 30 Minutes Intravenous Once 10/30/19 1815 10/30/19 2053       I have personally reviewed the following labs and images: CBC: Recent Labs  Lab 10/30/19 1058 10/31/19 0500 11/01/19 0313 11/02/19 0500  WBC 9.0 4.4 6.4 10.9*  NEUTROABS 7.5  --   --   --   HGB 11.8* 10.5* 10.4* 11.4*  HCT 35.8* 31.8* 31.5* 34.5*  MCV 98.9 99.1 98.1 99.4  PLT 253 243 308 408*   BMP &GFR Recent Labs  Lab 10/30/19 1058 10/31/19 0500 11/01/19 0313 11/02/19 0500  NA 137 139 138 139  K 3.5 3.9 3.6 3.6  CL 97* 103 103 104  CO2 27 27 27 26   GLUCOSE 99 133* 157* 149*  BUN 18 15 27* 29*  CREATININE 0.87 0.71 0.78 0.65  CALCIUM 8.9 8.3* 8.4* 8.9  MG  --  1.8  --   --    Estimated Creatinine Clearance: 40.7 mL/min (by C-G formula based on SCr of 0.65 mg/dL). Liver & Pancreas: Recent Labs  Lab 10/30/19 1058 10/31/19 0500 11/01/19 0313 11/02/19 0500  AST 55* 48* 54* 55*  ALT 38 35 40 49*  ALKPHOS 71 62 56 62  BILITOT 0.5 0.7 0.7 0.7  PROT 6.7 5.9* 5.6* 6.3*  ALBUMIN 3.2* 2.8* 2.6* 3.1*   No results for input(s): LIPASE, AMYLASE in the last 168 hours. No results for input(s): AMMONIA in the last 168 hours. Diabetic: No results for input(s): HGBA1C in the last 72 hours. No results for input(s): GLUCAP in the last 168 hours. Cardiac Enzymes: Recent Labs  Lab 10/31/19 1825  CKTOTAL 43  CKMB 4.1   No results for input(s): PROBNP in the last 8760 hours. Coagulation Profile: No results for input(s): INR, PROTIME in the last 168 hours. Thyroid Function Tests: No results for input(s): TSH, T4TOTAL, FREET4, T3FREE, THYROIDAB in the last 72 hours. Lipid Profile: No results for input(s): CHOL, HDL, LDLCALC, TRIG, CHOLHDL, LDLDIRECT in the last 72 hours. Anemia Panel: Recent Labs    11/01/19 0313  11/02/19 0500  FERRITIN 321* 313*   Urine analysis:    Component Value Date/Time   COLORURINE YELLOW 10/30/2019 [place] 10/30/2019 1049   LABSPEC 1.028 10/30/2019 1049   PHURINE 5.0 10/30/2019 1049   GLUCOSEU NEGATIVE 10/30/2019 1049   HGBUR SMALL (A) 10/30/2019 1049   BILIRUBINUR NEGATIVE 10/30/2019 1049   KETONESUR 20 (A) 10/30/2019 1049   PROTEINUR NEGATIVE 10/30/2019 1049   NITRITE NEGATIVE 10/30/2019 1049   LEUKOCYTESUR NEGATIVE 10/30/2019 1049   Sepsis Labs: Invalid input(s): PROCALCITONIN, [place]  Microbiology: Recent Results (from the past 240 hour(s))  SARS CORONAVIRUS 2 (TAT 6-24 HRS) Nasopharyngeal Nasopharyngeal Swab     Status: Abnormal   Collection Time: 10/30/19  6:18 PM   Specimen: Nasopharyngeal Swab  Result Value Ref Range Status   SARS Coronavirus 2 POSITIVE (A) NEGATIVE Final    Comment: RESULT CALLED TO, READ BACK BY AND VERIFIED WITH: I ORA,RN 0121 10/31/2019 D BRADLEY (NOTE) SARS-CoV-2 target nucleic acids are DETECTED. The SARS-CoV-2 RNA is generally detectable in upper and lower respiratory specimens during the acute phase of infection. Positive results are indicative of the presence of SARS-CoV-2 RNA. Clinical correlation with patient history and other diagnostic information is  necessary to determine patient infection status. Positive results do not rule out bacterial infection or co-infection with other viruses.  The expected result is Negative. Fact Sheet for Patients: SugarRoll.be Fact Sheet for Healthcare Providers: https://www.woods-mathews.com/ This test is not yet approved or cleared by the [place] FDA and  has been authorized for detection and/or diagnosis of SARS-CoV-2 by FDA under an Emergency Use Authorization (EUA). This EUA will remain  in effect (meaning this test can be used) for the  duration of the COVID-19 declaration under Section 564(b)(1) of the Act, 21  U.S.C. section 360bbb-3(b)(1), unless the authorization is terminated or revoked sooner. Performed at [facility], [place] [address], [place], [place] [address]   Expectorated sputum assessment w rflx to resp cult     Status: None   Collection Time: 10/30/19  8:32 PM   Specimen: Sputum  Result Value Ref Range Status   Specimen Description SPU EXPECTORATED  Final   Special Requests NONE  Final   Sputum evaluation   Final    THIS SPECIMEN IS ACCEPTABLE FOR  SPUTUM CULTURE Performed at [facility], [place] [address], [place], [place] [address]    Report Status 10/30/2019 FINAL  Final  Culture, respiratory     Status: None (Preliminary result)   Collection Time: 10/30/19  8:32 PM   Specimen: Sputum  Result Value Ref Range Status   Specimen Description   Final    SPU EXPECTORATED Performed at [place] [address] [place], [place] [address]    Special Requests   Final    NONE Reflexed from [id number] Performed at [place] [address], [place], [place] [address]    Gram Stain   Final    RARE WBC PRESENT, PREDOMINANTLY PMN FEW GRAM POSITIVE COCCI IN PAIRS    Culture   Final    CULTURE REINCUBATED FOR BETTER GROWTH Performed at [facility], [place] [address] [place], [place] [address]    Report Status PENDING  Incomplete    Radiology Studies: No results found.   Heavenleigh Petruzzi T. Lecompte  If 7PM-7AM, please contact night-coverage [email or web address] Password [email or web address] 11/02/2019, 2:10 PM

## 2019-11-02 NOTE — Progress Notes (Signed)
Chaplain provided support with Mrs Calabro around conference with Care team and family via [place].    Chaplain present during conference with pt, provided support with pt around anticipatory grief, narrative review, prayers and pastoral support following conference with care team.     Clinical Time: 2 hours     Jerene Pitch, MDiv, [facility]

## 2019-11-02 NOTE — Progress Notes (Addendum)
Chaplain provided support with Mrs Gronseth and spouse via ipad.  Pt's spouse is also COIVD + and in [place].    Provided pastoral support and prayers with pt and spouse, narrative review - identifying values that inform her care for him, and anticipatory grief work.      Mrs Nevel's faith figures prominently in her coping.  She is feeling distance, not being able to be with Fritz Pickerel, and benefits from prayers for God's presence with him.  She has named guilt around feeling that she did not respond quickly enough to illness.  Her family has been reassuring her, reminding her that she has done everything she can, and recognizing her care and love for Fritz Pickerel  Mrs Esposito understands that she will need to make decision around pt's extubation.  She wished her nephew, Randall Hiss, to take on this role, and Randall Hiss has maintained that he wishes to support her in this, but is unable to be the one who makes the decision.  Mrs Bellomy does not want Fritz Pickerel to suffer and recognizes that the time for extubation is approaching.   Mrs. Chrest does not want to be alone when she makes this decision.    Clinical Time: 1.25 hours     Jerene Pitch, MDiv, [facility]

## 2019-11-02 NOTE — Progress Notes (Signed)
PT Cancellation Note  Patient Details Name: Brenda Patrick MRN: [id number] DOB: 11/14/1932   Cancelled Treatment:    Reason Eval/Treat Not Completed: Other (comment) PT orders received and chart reviewed.  Noted pt's spouse at [facility] with [place].  Spoke with RN and pt's spouse is currently being terminally weaned from ventilator at [facility] and pt is not appropriate/able to work with PT today.  Will f/u as able.  Maggie Font, PT Acute Rehab Services Pager [phone] [facility] [place] 11/02/2019, 3:02 PM

## 2019-11-02 NOTE — Care Management Important Message (Signed)
Important Message  Patient Details IM Letter given to Gabriel Earing RN Case Manager to present to the Patient Name: Brenda Patrick MRN: [id number] Date of Birth: [birth year]   Medicare Important Message Given:  Yes     Kerin Salen 11/02/2019, 10:15 AM

## 2019-11-03 LAB — D-DIMER, QUANTITATIVE: D-Dimer, Quant: 0.79 ug/mL-FEU — ABNORMAL HIGH (ref 0.00–0.50)

## 2019-11-03 LAB — CBC
HCT: 35.5 % — ABNORMAL LOW (ref 36.0–46.0)
Hemoglobin: 11.7 g/dL — ABNORMAL LOW (ref 12.0–15.0)
MCH: 32 pg (ref 26.0–34.0)
MCHC: 33 g/dL (ref 30.0–36.0)
MCV: 97 fL (ref 80.0–100.0)
Platelets: 543 10*3/uL — ABNORMAL HIGH (ref 150–400)
RBC: 3.66 MIL/uL — ABNORMAL LOW (ref 3.87–5.11)
RDW: 12 % (ref 11.5–15.5)
WBC: 9.5 10*3/uL (ref 4.0–10.5)
nRBC: 0 % (ref 0.0–0.2)

## 2019-11-03 LAB — COMPREHENSIVE METABOLIC PANEL
ALT: 58 U/L — ABNORMAL HIGH (ref 0–44)
AST: 50 U/L — ABNORMAL HIGH (ref 15–41)
Albumin: 3.2 g/dL — ABNORMAL LOW (ref 3.5–5.0)
Alkaline Phosphatase: 64 U/L (ref 38–126)
Anion gap: 10 (ref 5–15)
BUN: 32 mg/dL — ABNORMAL HIGH (ref 8–23)
CO2: 26 mmol/L (ref 22–32)
Calcium: 9.1 mg/dL (ref 8.9–10.3)
Chloride: 103 mmol/L (ref 98–111)
Creatinine, Ser: 0.66 mg/dL (ref 0.44–1.00)
GFR calc Af Amer: >60 mL/min (ref >60)
GFR calc non Af Amer: >60 mL/min (ref >60)
Glucose, Bld: 139 mg/dL — ABNORMAL HIGH (ref 70–99)
Potassium: 3.7 mmol/L (ref 3.5–5.1)
Sodium: 139 mmol/L (ref 135–145)
Total Bilirubin: 0.8 mg/dL (ref 0.3–1.2)
Total Protein: 6.5 g/dL (ref 6.5–8.1)

## 2019-11-03 LAB — CULTURE, RESPIRATORY W GRAM STAIN: Culture: NORMAL

## 2019-11-03 LAB — C-REACTIVE PROTEIN: CRP: 2.2 mg/dL — ABNORMAL HIGH (ref <1.0)

## 2019-11-03 LAB — LACTATE DEHYDROGENASE: LDH: 264 U/L — ABNORMAL HIGH (ref 98–192)

## 2019-11-03 LAB — FERRITIN: Ferritin: 282 ng/mL (ref 11–307)

## 2019-11-03 MED ORDER — CLONAZEPAM 0.5 MG PO TABS: 0.5000 mg | ORAL_TABLET | Freq: Two times a day (BID) | ORAL | 0 refills | Status: DC

## 2019-11-03 MED ORDER — BUSPIRONE HCL 7.5 MG PO TABS: 7.5000 mg | ORAL_TABLET | Freq: Two times a day (BID) | ORAL | 0 refills | Status: DC

## 2019-11-03 NOTE — Progress Notes (Signed)
PROGRESS NOTE  Brenda Patrick [id number] DOB: [birth year]   PCP: Lillard Anes, MD  Patient is from: Home.  Independently ambulates at baseline.  DOA: 10/30/2019 LOS: 4  Brief Narrative / Interim history: 85 year old female with history of HTN, anxiety and depression presented with worsening shortness of breath, fatigue, productive cough and fever.  Tested positive for COVID-19 2 weeks prior to our admission.  Husband currently intubated and in ICU at [facility].  In ED, saturating from 88% to 92% on RA.  Slightly tachypneic.  CMP and CBC without significant finding other than AST to 55 and Hgb to 11.8.  High-sensitivity troponin 19.  Pro-Cal 0.19.  UA with 20 ketones.  COVID-19 PCR positive.  CXR without significant finding.  Blood and sputum cultures obtained.  CTA chest ordered.  Started on ceftriaxone, azithromycin, Decadron and remdesivir and admitted for COVID-19 pneumonia and possible bacterial CAP.   CTA chest negative for PE but reveals groundglass opacities in RUL and LUL.  Ceftriaxone and azithromycin discontinued the next day.  Continued on remdesivir and Decadron.  Remained stable from Covid infection standpoint.  Of note, husband passed away at [facility] on 04-Nov-2019 after battle with COVID-19 infection.   Subjective: No major events overnight of this morning.  Started about husband's death but states having better sleep last night.  No complaints.  Denies chest pain, dyspnea, GI or UTI symptoms.  Dysphagia resolved.  Reports intermittent cough.  Seems to be coping well.   Objective: Vitals:   11/04/19 1949 11/03/19 0511 11/03/19 0917 11/03/19 1412  BP: (!) 123/54 130/62 (!) 158/84 128/67  Pulse: 69 65 70 73  Resp: 18 20  (!) 23  Temp: 98 F (36.7 C) 98.7 F (37.1 C)  98.3 F (36.8 C)  TempSrc: Oral Oral  Oral  SpO2: 100% 92% 97% 94%  Weight:      Height:        Intake/Output Summary (Last 24 hours) at 11/03/2019 1427 Last data filed at 11/03/2019  0300 Gross per 24 hour  Intake --  Output 300 ml  Net -300 ml   Filed Weights   10/30/19 1954  Weight: 52.6 kg    Examination:  GENERAL: No apparent distress.  Lying comfortably.  Nontoxic.Marland Kitchen  HEENT: MMM.  Vision and hearing grossly intact.  NECK: Supple.  No apparent JVD.  RESP:  No IWOB.  Fair aeration bilaterally. CVS:  RRR. Heart sounds normal.  ABD/GI/GU: Bowel sounds present. Soft. Non tender.  MSK/EXT:  Moves extremities. No apparent deformity or edema.  SKIN: no apparent skin lesion or wound NEURO: Awake, alert and oriented appropriately.  No apparent focal neuro deficit. PSYCH: Slightly anxious.   Procedures:  None  Assessment & Plan: Acute respiratory failure with hypoxia due to COVID-19 pneumonia versus bacterial CAP. CTA chest negative for PE but bilateral upper lobe groundglass opacities.  Pro-Cal 0.19> 0.17> 0.10.  Remains on room air.  Inflammatory markers downtrending. Recent Labs    11/01/19 0313 11-04-19 0500 11/03/19 0410  DDIMER 0.82* 1.00* 0.79*  FERRITIN 321* 313* 282  LDH 273* 274* 264*  CRP 7.5* 4.0* 2.2*  -Continue remdesivir and Decadron 12/16>> -Monitor inflammatory markers -Inhalers, mucolytic's, antitussive and incentive spirometry -OOB/PT/OT-recommended home health -daily ambulation for saturation assessment.  Anxiety/grief: [facility]04.  Seems to be coping well. -Continue emotional support, Klonopin, BuSpar and Celexa.  -Frequent reorientation and delirium precautions.  Elevated liver enzymes: Likely due to COVID-19 infection and remdesivir. -Continue monitoring.  Essential hypertension: BP elevated. -Continue home atenolol, losartan and HCTZ. -Continue amlodipine.  Dysphagia: Resolved. -Appreciate SLP input  Leukocytosis/bandemia: Likely due to steroid.  Resolved.    Nutrition Problem: Increased nutrient needs Etiology: acute illness(COVID-19)  Signs/Symptoms: estimated needs  Interventions:  Ensure Enlive (each supplement provides 350kcal and 20 grams of protein), Magic cup, MVI   DVT prophylaxis: Subcu Lovenox Code Status: Full code Family Communication: Updated patient's niece, Mr. Hook [facility] PO[facility] and his wife over the phone Disposition Plan: Anticipate discharge home on 12/20 with home health if remains stable.  Consultants: None.    Microbiology summarized: [id number] positive.  Sch Meds:  Scheduled Meds: . amLODipine  5 mg Oral Daily  . atenolol  50 mg Oral Daily  . busPIRone  7.5 mg Oral BID  . Chlorhexidine Gluconate Cloth  6 each Topical Daily  . citalopram  40 mg Oral Daily  . clonazePAM  1 mg Oral BID  . dexamethasone  6 mg Oral Daily  . enoxaparin (LOVENOX) injection  40 mg Subcutaneous Q24H  . feeding supplement (ENSURE ENLIVE)  237 mL Oral BID BM  . fluticasone  2 spray Each Nare Daily  . hydrochlorothiazide  12.5 mg Oral Daily  . losartan  100 mg Oral Daily  . multivitamin with minerals   Oral Daily  . simvastatin  20 mg Oral q1800   Continuous Infusions:  PRN Meds:.acetaminophen, sodium chloride flush  Antimicrobials: Anti-infectives (From admission, onward)   Start     Dose/Rate Route Frequency Ordered Stop   10/31/19 1000  remdesivir 100 mg in sodium chloride 0.9 % 100 mL IVPB     100 mg 200 mL/hr over 30 Minutes Intravenous Daily 10/30/19 1815 11/03/19 0957   10/30/19 1930  cefTRIAXone (ROCEPHIN) 1 g in sodium chloride 0.9 % 100 mL IVPB  Status:  Discontinued     1 g 200 mL/hr over 30 Minutes Intravenous Every 24 hours 10/30/19 1927 10/31/19 0719   10/30/19 1930  azithromycin (ZITHROMAX) tablet 250 mg  Status:  Discontinued     250 mg Oral Daily 10/30/19 1927 10/31/19 0719   10/30/19 1930  remdesivir 200 mg in sodium chloride 0.9% 250 mL IVPB     200 mg 580 mL/hr over 30 Minutes Intravenous Once 10/30/19 1815 10/30/19 2053       I have personally reviewed the following labs and images: CBC: Recent Labs  Lab 10/30/19 1058  10/31/19 0500 11/01/19 0313 11/02/19 0500 11/03/19 0410  WBC 9.0 4.4 6.4 10.9* 9.5  NEUTROABS 7.5  --   --   --   --   HGB 11.8* 10.5* 10.4* 11.4* 11.7*  HCT 35.8* 31.8* 31.5* 34.5* 35.5*  MCV 98.9 99.1 98.1 99.4 97.0  PLT 253 243 308 408* 543*   BMP &GFR Recent Labs  Lab 10/30/19 1058 10/31/19 0500 11/01/19 0313 11/02/19 0500 11/03/19 0410  NA 137 139 138 139 139  K 3.5 3.9 3.6 3.6 3.7  CL 97* 103 103 104 103  CO2 27 27 27 26 26   GLUCOSE 99 133* 157* 149* 139*  BUN 18 15 27* 29* 32*  CREATININE 0.87 0.71 0.78 0.65 0.66  CALCIUM 8.9 8.3* 8.4* 8.9 9.1  MG  --  1.8  --   --   --    Estimated Creatinine Clearance: 40.7 mL/min (by C-G formula based on SCr of 0.66 mg/dL). Liver & Pancreas: Recent Labs  Lab 10/30/19 1058 10/31/19 0500 11/01/19 0313 11/02/19 0500 11/03/19 0410  AST 55* 48* 54*  55* 50*  ALT 38 35 40 49* 58*  ALKPHOS 71 62 56 62 64  BILITOT 0.5 0.7 0.7 0.7 0.8  PROT 6.7 5.9* 5.6* 6.3* 6.5  ALBUMIN 3.2* 2.8* 2.6* 3.1* 3.2*   No results for input(s): LIPASE, AMYLASE in the last 168 hours. No results for input(s): AMMONIA in the last 168 hours. Diabetic: No results for input(s): HGBA1C in the last 72 hours. No results for input(s): GLUCAP in the last 168 hours. Cardiac Enzymes: Recent Labs  Lab 10/31/19 1825  CKTOTAL 43  CKMB 4.1   No results for input(s): PROBNP in the last 8760 hours. Coagulation Profile: No results for input(s): INR, PROTIME in the last 168 hours. Thyroid Function Tests: No results for input(s): TSH, T4TOTAL, FREET4, T3FREE, THYROIDAB in the last 72 hours. Lipid Profile: No results for input(s): CHOL, HDL, LDLCALC, TRIG, CHOLHDL, LDLDIRECT in the last 72 hours. Anemia Panel: Recent Labs    11/02/19 0500 11/03/19 0410  FERRITIN 313* 282   Urine analysis:    Component Value Date/Time   COLORURINE YELLOW 10/30/2019 [place] 10/30/2019 1049   LABSPEC 1.028 10/30/2019 1049   PHURINE 5.0 10/30/2019 1049    GLUCOSEU NEGATIVE 10/30/2019 1049   HGBUR SMALL (A) 10/30/2019 1049   BILIRUBINUR NEGATIVE 10/30/2019 1049   KETONESUR 20 (A) 10/30/2019 1049   PROTEINUR NEGATIVE 10/30/2019 1049   NITRITE NEGATIVE 10/30/2019 1049   LEUKOCYTESUR NEGATIVE 10/30/2019 1049   Sepsis Labs: Invalid input(s): PROCALCITONIN, [place]  Microbiology: Recent Results (from the past 240 hour(s))  SARS CORONAVIRUS 2 (TAT 6-24 HRS) Nasopharyngeal Nasopharyngeal Swab     Status: Abnormal   Collection Time: 10/30/19  6:18 PM   Specimen: Nasopharyngeal Swab  Result Value Ref Range Status   SARS Coronavirus 2 POSITIVE (A) NEGATIVE Final    Comment: RESULT CALLED TO, READ BACK BY AND VERIFIED WITH: I ORA,RN 0121 10/31/2019 D BRADLEY (NOTE) SARS-CoV-2 target nucleic acids are DETECTED. The SARS-CoV-2 RNA is generally detectable in upper and lower respiratory specimens during the acute phase of infection. Positive results are indicative of the presence of SARS-CoV-2 RNA. Clinical correlation with patient history and other diagnostic information is  necessary to determine patient infection status. Positive results do not rule out bacterial infection or co-infection with other viruses.  The expected result is Negative. Fact Sheet for Patients: SugarRoll.be Fact Sheet for Healthcare Providers: https://www.woods-mathews.com/ This test is not yet approved or cleared by the [place] FDA and  has been authorized for detection and/or diagnosis of SARS-CoV-2 by FDA under an Emergency Use Authorization (EUA). This EUA will remain  in effect (meaning this test can be used) for the  duration of the COVID-19 declaration under Section 564(b)(1) of the Act, 21 U.S.C. section 360bbb-3(b)(1), unless the authorization is terminated or revoked sooner. Performed at [facility], [place] [address], [place], [place] [address]   Expectorated sputum assessment w rflx to resp cult      Status: None   Collection Time: 10/30/19  8:32 PM   Specimen: Sputum  Result Value Ref Range Status   Specimen Description SPU EXPECTORATED  Final   Special Requests NONE  Final   Sputum evaluation   Final    THIS SPECIMEN IS ACCEPTABLE FOR SPUTUM CULTURE Performed at [facility], [place] [address], [place], [place] [address]    Report Status 10/30/2019 FINAL  Final  Culture, respiratory     Status: None (Preliminary result)   Collection Time: 10/30/19  8:32 PM   Specimen: Sputum  Result Value Ref Range Status   Specimen Description   Final    SPU EXPECTORATED Performed at [place] [address] [place], [place] [address]    Special Requests   Final    NONE Reflexed from [phone] Performed at [place] [address], [place], [place] [address]    Gram Stain   Final    RARE WBC PRESENT, PREDOMINANTLY PMN FEW GRAM POSITIVE COCCI IN PAIRS    Culture   Final    CULTURE REINCUBATED FOR BETTER GROWTH Performed at [facility], [place] [address], [place], [place] [address]    Report Status PENDING  Incomplete    Radiology Studies: No results found.   Jhaden Pizzuto T. [place]  If 7PM-7AM, please contact night-coverage [email or web address] Password [email or web address] 11/03/2019, 2:27 PM

## 2019-11-03 NOTE — Evaluation (Signed)
Physical Therapy Evaluation Patient Details Name: Brenda Patrick MRN: [id number] DOB: [birth year] Today's Date: 11/03/2019   History of Present Illness  Pt is 85 y.o. female with medical history significant of hypertension anxiety and depression.  Pt was diagnosed with COVID 19 about 2 weeks ago and admitted with COVID 19 PNE vs bacterial CAP.  Pts husband was admitted to [facility] [place] and passed away on 2019/11/28 from [place].  Clinical Impression  Pt admitted with above diagnosis. Pt was able to ambulate in room 150' with RW and min guard.  All VSS throughout PT.  Pt currently with functional limitations due to the deficits listed below (see PT Problem List). Pt will benefit from skilled PT to increase their independence and safety with mobility to allow discharge to the venue listed below.       Follow Up Recommendations No PT follow up    Equipment Recommendations  Rolling walker with 5" wheels    Recommendations for Other Services       Precautions / Restrictions Precautions Precautions: Fall Precaution Comments: COVID + (diagnosed 2 weeks ago) Restrictions Weight Bearing Restrictions: No Other Position/Activity Restrictions: Pt reports breaking something at top part of R shoulder; has sling in room but reports was for comfort - reports allowed to have arm out of sling.      Mobility  Bed Mobility Overal bed mobility: Modified Independent                Transfers Overall transfer level: Needs assistance Equipment used: Rolling walker (2 wheeled) Transfers: Sit to/from Stand Sit to Stand: Min guard            Ambulation/Gait Ambulation/Gait assistance: Min guard Gait Distance (Feet): 150 Feet Assistive device: Rolling walker (2 wheeled) For balance, no weight in walker Gait Pattern/deviations: Step-through pattern;Decreased stride length     General Gait Details: decreased velocity;  attempted without AD but with mild unsteadiness  Stairs            Wheelchair Mobility    Modified Rankin (Stroke Patients Only)       Balance Overall balance assessment: Needs assistance Sitting-balance support: No upper extremity supported;Feet supported Sitting balance-Leahy Scale: Good     Standing balance support: Single extremity supported Standing balance-Leahy Scale: Fair                               Pertinent Vitals/Pain Pain Assessment: No/denies pain    Home Living Family/patient expects to be discharged to:: Private residence Living Arrangements: Alone(Spouse passed 2019/11/28 at [facility]) Available Help at Discharge: Friend(s);Available PRN/intermittently Type of Home: House Home Access: Stairs to enter Entrance Stairs-Rails: Right;Left;Can reach both Entrance Stairs-Number of Steps: 5 Home Layout: One level Home Equipment: None      Prior Function           Comments: Could ambulate without AD (reports used furniture).  Ambulated community distances.  Mod Independent since fell and injured (broke her shoulder the first Saturday in November); uses LUE mostly, but has been using RUE more lately     Hand Dominance   Dominant Hand: Right    Extremity/Trunk Assessment   Upper Extremity Assessment Upper Extremity Assessment: Defer to OT evaluation RUE Deficits / Details: PER OT NOTE: [place] the first Saturday in November and broke something at top of her shoulder (she pointed to top of joint), no notes in chart to determine exactly what happened. Pt is using  her arm somewhat functionally but it is limited. She has a sling in room but says she really does not want to use it.    Lower Extremity Assessment Lower Extremity Assessment: Overall WFL for tasks assessed    Cervical / Trunk Assessment Cervical / Trunk Assessment: Normal  Communication   Communication: No difficulties  Cognition Arousal/Alertness: Awake/alert Behavior During Therapy: WFL for tasks assessed/performed Overall Cognitive Status: Within  Functional Limits for tasks assessed                                        General Comments General comments (skin integrity, edema, etc.): On RA: O2 sats 97% rest and up to 100% walking    Exercises     Assessment/Plan    PT Assessment Patient needs continued PT services  PT Problem List Decreased strength;Decreased mobility;Decreased activity tolerance;Decreased balance;Cardiopulmonary status limiting activity;Decreased knowledge of use of DME       PT Treatment Interventions DME instruction;Therapeutic activities;Gait training;Therapeutic exercise;Patient/family education;Functional mobility training    PT Goals (Current goals can be found in the Care Plan section)  Acute Rehab PT Goals Patient Stated Goal: to be able to go home PT Goal Formulation: With patient Time For Goal Achievement: 11/16/19 Potential to Achieve Goals: Good    Frequency Min 2X/week   Barriers to discharge Decreased caregiver support      Co-evaluation               AM-PAC PT "6 Clicks" Mobility  Outcome Measure Help needed turning from your back to your side while in a flat bed without using bedrails?: None Help needed moving from lying on your back to sitting on the side of a flat bed without using bedrails?: None Help needed moving to and from a bed to a chair (including a wheelchair)?: None Help needed standing up from a chair using your arms (e.g., wheelchair or bedside chair)?: None Help needed to walk in hospital room?: None Help needed climbing 3-5 steps with a railing? : A Little 6 Click Score: 23    End of Session Equipment Utilized During Treatment: Gait belt Activity Tolerance: Patient tolerated treatment well Patient left: in bed;with call bell/phone within reach;with bed alarm set Nurse Communication: Mobility status PT Visit Diagnosis: Unsteadiness on feet (R26.81);Muscle weakness (generalized) (M62.81)    Time: [id number] PT Time Calculation (min)  (ACUTE ONLY): 23 min   Charges:   PT Evaluation $PT Eval Low Complexity: 1 Low          Maggie Font, PT Acute Rehab Services Pager [phone] [facility] [phone] [facility] [phone]   Karlton Lemon 11/03/2019, 4:04 PM

## 2019-11-04 LAB — C-REACTIVE PROTEIN: CRP: 1.2 mg/dL — ABNORMAL HIGH (ref <1.0)

## 2019-11-04 LAB — COMPREHENSIVE METABOLIC PANEL
ALT: 45 U/L — ABNORMAL HIGH (ref 0–44)
AST: 30 U/L (ref 15–41)
Albumin: 2.7 g/dL — ABNORMAL LOW (ref 3.5–5.0)
Alkaline Phosphatase: 53 U/L (ref 38–126)
Anion gap: 10 (ref 5–15)
BUN: 37 mg/dL — ABNORMAL HIGH (ref 8–23)
CO2: 25 mmol/L (ref 22–32)
Calcium: 8.9 mg/dL (ref 8.9–10.3)
Chloride: 102 mmol/L (ref 98–111)
Creatinine, Ser: 0.81 mg/dL (ref 0.44–1.00)
GFR calc Af Amer: >60 mL/min (ref >60)
GFR calc non Af Amer: >60 mL/min (ref >60)
Glucose, Bld: 155 mg/dL — ABNORMAL HIGH (ref 70–99)
Potassium: 3.7 mmol/L (ref 3.5–5.1)
Sodium: 137 mmol/L (ref 135–145)
Total Bilirubin: 0.6 mg/dL (ref 0.3–1.2)
Total Protein: 5.8 g/dL — ABNORMAL LOW (ref 6.5–8.1)

## 2019-11-04 LAB — CBC
HCT: 33.5 % — ABNORMAL LOW (ref 36.0–46.0)
Hemoglobin: 10.9 g/dL — ABNORMAL LOW (ref 12.0–15.0)
MCH: 32.4 pg (ref 26.0–34.0)
MCHC: 32.5 g/dL (ref 30.0–36.0)
MCV: 99.7 fL (ref 80.0–100.0)
Platelets: 466 10*3/uL — ABNORMAL HIGH (ref 150–400)
RBC: 3.36 MIL/uL — ABNORMAL LOW (ref 3.87–5.11)
RDW: 12.2 % (ref 11.5–15.5)
WBC: 9.7 10*3/uL (ref 4.0–10.5)
nRBC: 0 % (ref 0.0–0.2)

## 2019-11-04 LAB — D-DIMER, QUANTITATIVE: D-Dimer, Quant: 0.5 ug/mL-FEU (ref 0.00–0.50)

## 2019-11-04 LAB — LACTATE DEHYDROGENASE: LDH: 212 U/L — ABNORMAL HIGH (ref 98–192)

## 2019-11-04 LAB — FERRITIN: Ferritin: 212 ng/mL (ref 11–307)

## 2019-11-04 MED ORDER — DEXAMETHASONE 6 MG PO TABS: 6.0000 mg | ORAL_TABLET | Freq: Every day | ORAL | 0 refills | Status: DC

## 2019-11-04 NOTE — Progress Notes (Signed)
Discharge instructions and prescriptions  reviewed with patient daughter Teka Watts per patient request.  Patient discharged to home, DME (rolling walker) sent home with patient .

## 2019-11-04 NOTE — Discharge Instructions (Signed)
Follow with Lillard Anes, MD in 5-7 days  Please get a complete blood count and chemistry panel checked by your Primary MD at your next visit, and again as instructed by your Primary MD. Please get your medications reviewed and adjusted by your Primary MD.  Please request your Primary MD to go over all Hospital Tests and Procedure/Radiological results at the follow up, please get all Hospital records sent to your Prim MD by signing hospital release before you go home.  In some cases, there will be blood work, cultures and biopsy results pending at the time of your discharge. Please request that your primary care M.D. goes through all the records of your hospital data and follows up on these results.  If you had Pneumonia of Lung problems at the Hospital: Please get a 2 view Chest X ray done in 6-8 weeks after hospital discharge or sooner if instructed by your Primary MD.  If you have Congestive Heart Failure: Please call your Cardiologist or Primary MD anytime you have any of the following symptoms:  1) 3 pound weight gain in 24 hours or 5 pounds in 1 week  2) shortness of breath, with or without a dry hacking cough  3) swelling in the hands, feet or stomach  4) if you have to sleep on extra pillows at night in order to breathe  Follow cardiac low salt diet and 1.5 lit/day fluid restriction.  If you have diabetes Accuchecks 4 times/day, Once in AM empty stomach and then before each meal. Log in all results and show them to your primary doctor at your next visit. If any glucose reading is under 80 or above 300 call your primary MD immediately.  If you have Seizure/Convulsions/Epilepsy: Please do not drive, operate heavy machinery, participate in activities at heights or participate in high speed sports until you have seen by Primary MD or a Neurologist and advised to do so again. Per [facility] statutes, patients with seizures are not allowed to drive until they have been  seizure-free for six months.  Use caution when using heavy equipment or power tools. Avoid working on ladders or at heights. Take showers instead of baths. Ensure the water temperature is not too high on the home water heater. Do not go swimming alone. Do not lock yourself in a room alone (i.e. bathroom). When caring for infants or small children, sit down when holding, feeding, or changing them to minimize risk of injury to the child in the event you have a seizure. Maintain good sleep hygiene. Avoid alcohol.   If you had Gastrointestinal Bleeding: Please ask your Primary MD to check a complete blood count within one week of discharge or at your next visit. Your endoscopic/colonoscopic biopsies that are pending at the time of discharge, will also need to followed by your Primary MD.  Get Medicines reviewed and adjusted. Please take all your medications with you for your next visit with your Primary MD  Please request your Primary MD to go over all hospital tests and procedure/radiological results at the follow up, please ask your Primary MD to get all Hospital records sent to his/her office.  If you experience worsening of your admission symptoms, develop shortness of breath, life threatening emergency, suicidal or homicidal thoughts you must seek medical attention immediately by calling 911 or calling your MD immediately  if symptoms less severe.  You must read complete instructions/literature along with all the possible adverse reactions/side effects for all the Medicines you  take and that have been prescribed to you. Take any new Medicines after you have completely understood and accpet all the possible adverse reactions/side effects.   Do not drive or operate heavy machinery when taking Pain medications.   Do not take more than prescribed Pain, Sleep and Anxiety Medications  Special Instructions: If you have smoked or chewed Tobacco  in the last 2 yrs please stop smoking, stop any regular  Alcohol  and or any Recreational drug use.  Wear Seat belts while driving.  Please note You were cared for by a hospitalist during your hospital stay. If you have any questions about your discharge medications or the care you received while you were in the hospital after you are discharged, you can call the unit and asked to speak with the hospitalist on call if the hospitalist that took care of you is not available. Once you are discharged, your primary care physician will handle any further medical issues. Please note that NO REFILLS for any discharge medications will be authorized once you are discharged, as it is imperative that you return to your primary care physician (or establish a relationship with a primary care physician if you do not have one) for your aftercare needs so that they can reassess your need for medications and monitor your lab values.  You can reach the hospitalist office at phone [phone] or fax [phone]   If you do not have a primary care physician, you can call [phone] for a physician referral.  Activity: As tolerated with Full fall precautions use walker/cane & assistance as needed    Diet: regular  Disposition Home

## 2019-11-04 NOTE — Discharge Summary (Signed)
Physician Discharge Summary  Brenda Patrick [id number] DOB: [birth year] DOA: 10/30/2019  PCP: Lillard Anes, MD  Admit date: 10/30/2019 Discharge date: 11/04/2019  Admitted From: home Disposition:  home  Recommendations for Outpatient Follow-up:  1. Follow up with PCP in 1-2 weeks 2. Continue decadron for 5 days   Home Health: none Equipment/Devices: none  Discharge Condition: stable CODE STATUS: Full code Diet recommendation: regular  HPI: Per admitting MD, Brenda Patrick is a 85 y.o. female with medical history significant of hypertension anxiety and depression came to [facility] regional emergency room with complaints of worsening fatigue and shortness of breath along with cough over past few days.  Patient mentioned that she and her husband were diagnosed with COVID-19 infection about 2 weeks ago at an outside facility.  Her husband is currently admitted at [facility] and is intubated and in ICU.  For the past few days, she has noticed decrease in her energy, unable to do activities of daily living and feeling more short of breath especially with exertion.  She has also noticed low-grade fever of 100.7 over the past 2 3 days along with cough with greenish-yellow phlegm production.  She denies any nausea vomiting chest pain palpitations.  She does have chronic diarrhea from her underlying anxiety.  No congestion.  In the emergency room, her oxygen saturation varied from 88% to 92% on room air and with minimal exertion.  Labs are unremarkable except for low potassium. Chest x-ray is clear without any vascular congestion or consolidation. CT angiogram of the chest ordered by me since her hypoxia does not correlate with her chest x-ray findings. Patient is currently resting comfortably in bed.  She denies any other complaints. Social history is negative for any smoking alcohol or drug abuse. Family history is negative for any pulmonary embolism. She does not  have a known history of pulmonary embolism  Hospital Course / Discharge diagnoses: Acute hypoxic respiratory failure due to COVID-19 pneumonia -patient was admitted to the hospital with COVID-19 pneumonia.  She was started on remdesivir and completed 5 days while hospitalized.  She was also started on Decadron, and will be transitioned to oral steroids on discharge for 5 additional days to complete a 10-day course.  She was able to be weaned off to room air, clinically has returned to baseline and will be discharged home in stable condition. Elevated LFTs-stable, likely in the setting of viral infection Essential hypertension-continue home medications on discharge Anxiety-her husband just passed at [place], started on BuSpar, Klonopin Leukocytosis-resolved likely due to steroids  Discharge Instructions   Allergies as of 11/04/2019   No Known Allergies     Medication List    STOP taking these medications   LORazepam 1 MG tablet Commonly known as: ATIVAN     TAKE these medications   atenolol 50 MG tablet Commonly known as: TENORMIN Take 50 mg by mouth daily.   busPIRone 7.5 MG tablet Commonly known as: BUSPAR Take 1 tablet (7.5 mg total) by mouth 2 (two) times daily. What changed:   medication strength  how much to take  when to take this   citalopram 40 MG tablet Commonly known as: CELEXA Take 40 mg by mouth daily.   clonazePAM 0.5 MG tablet Commonly known as: KLONOPIN Take 1 tablet (0.5 mg total) by mouth 2 (two) times daily.   dexamethasone 6 MG tablet Commonly known as: DECADRON Take 1 tablet (6 mg total) by mouth daily. Start taking on: November 05, 2019   fluticasone 50 MCG/ACT nasal spray Commonly known as: FLONASE Place 2 sprays into both nostrils daily.   losartan-hydrochlorothiazide 100-12.5 MG tablet Commonly known as: HYZAAR Take 1 tablet by mouth daily.   MULTIVITAMIN WOMEN 50+ PO Take 1 tablet by mouth daily.   simvastatin 20 MG tablet Commonly  known as: ZOCOR Take 20 mg by mouth daily.            Durable Medical Equipment  (From admission, onward)         Start     Ordered   11/03/19 1616  For home use only DME Walker rolling  Once    Comments: With 5 inch wheels  Question:  Patient needs a walker to treat with the following condition  Answer:  Unsteady gait   11/03/19 1615         Follow-up Information    Lillard Anes, MD. Schedule an appointment as soon as possible for a visit in 2 week(s).   Specialty: Family Medicine Contact information: [address] [place] [address] [phone]           Consultations:  None   Procedures/Studies:  CT ANGIO CHEST PE W OR WO CONTRAST  Result Date: 10/30/2019 CLINICAL DATA:  Decreased oxygen saturation. Cough. Negative COVID-19 test today. EXAM: CT ANGIOGRAPHY CHEST WITH CONTRAST TECHNIQUE: Multidetector CT imaging of the chest was performed using the standard protocol during bolus administration of intravenous contrast. Multiplanar CT image reconstructions and MIPs were obtained to evaluate the vascular anatomy. CONTRAST:  100 mL OMNIPAQUE IOHEXOL 350 MG/ML SOLN COMPARISON:  Single-view of the chest 10/30/2019. PA and lateral chest 02/17/2017. FINDINGS: Cardiovascular: No pulmonary embolus is identified. Heart size is upper normal. No pericardial effusion. Calcific aortic and coronary atherosclerosis noted. Mediastinum/Nodes: No enlarged mediastinal, hilar, or axillary lymph nodes. Thyroid gland, trachea, and esophagus demonstrate no significant findings. Large hiatal hernia is identified. Lungs/Pleura: No pleural effusion. Patchy areas of ground-glass opacity are seen in the upper lobes bilaterally. There is also dependent bilateral ground-glass opacity. Upper Abdomen: Negative. Musculoskeletal: No acute or focal abnormality. Review of the MIP images confirms the above findings. IMPRESSION: Negative for pulmonary embolus. Patchy areas of  ground-glass opacity in the upper lobes bilaterally are worrisome for pneumonia including atypical/viral infection. Bilateral dependent ground-glass opacity could also reflect pneumonia but may also be due to atelectasis. Large hiatal hernia. Aortic Atherosclerosis (ICD10-I70.0). Calcific coronary artery disease also noted. Electronically Signed   By: Inge Rise M.D.   On: 10/30/2019 18:38   DG Chest Port 1 View  Result Date: 10/30/2019 CLINICAL DATA:  COVID.  Weakness. EXAM: PORTABLE CHEST 1 VIEW COMPARISON:  10/27/2019 FINDINGS: Cardiac silhouette normal in size. Moderate to large hiatal hernia. No mediastinal or hilar masses or evidence of adenopathy. Prominent bronchovascular and interstitial markings similar to the prior study. Lungs otherwise clear. No pleural effusion or pneumothorax. Skeletal structures are grossly intact. IMPRESSION: 1. No acute cardiopulmonary disease. Stable appearance from the recent prior study. Electronically Signed   By: Lajean Manes M.D.   On: 10/30/2019 12:36      Subjective: - no chest pain, shortness of breath, no abdominal pain, nausea or vomiting.   Discharge Exam: BP (!) 134/58 (BP Location: Right Arm)   Pulse 83   Temp 98.3 F (36.8 C) (Oral)   Resp 20   Ht 5\' 2"  (1.575 m)   Wt 52.6 kg   SpO2 90%   BMI 21.21 kg/m  General: Pt is alert, awake, not in acute distress Cardiovascular: RRR, S1/S2 +, no rubs, no gallops Respiratory: CTA bilaterally, no wheezing, no rhonchi Abdominal: Soft, NT, ND, bowel sounds + Extremities: no edema, no cyanosis    The results of significant diagnostics from this hospitalization (including imaging, microbiology, ancillary and laboratory) are listed below for reference.     Microbiology: Recent Results (from the past 240 hour(s))  SARS CORONAVIRUS 2 (TAT 6-24 HRS) Nasopharyngeal Nasopharyngeal Swab     Status: Abnormal   Collection Time: 10/30/19  6:18 PM   Specimen: Nasopharyngeal Swab  Result Value  Ref Range Status   SARS Coronavirus 2 POSITIVE (A) NEGATIVE Final    Comment: RESULT CALLED TO, READ BACK BY AND VERIFIED WITH: I ORA,RN 0121 10/31/2019 D BRADLEY (NOTE) SARS-CoV-2 target nucleic acids are DETECTED. The SARS-CoV-2 RNA is generally detectable in upper and lower respiratory specimens during the acute phase of infection. Positive results are indicative of the presence of SARS-CoV-2 RNA. Clinical correlation with patient history and other diagnostic information is  necessary to determine patient infection status. Positive results do not rule out bacterial infection or co-infection with other viruses.  The expected result is Negative. Fact Sheet for Patients: SugarRoll.be Fact Sheet for Healthcare Providers: https://www.woods-mathews.com/ This test is not yet approved or cleared by the [place] FDA and  has been authorized for detection and/or diagnosis of SARS-CoV-2 by FDA under an Emergency Use Authorization (EUA). This EUA will remain  in effect (meaning this test can be used) for the  duration of the COVID-19 declaration under Section 564(b)(1) of the Act, 21 U.S.C. section 360bbb-3(b)(1), unless the authorization is terminated or revoked sooner. Performed at [facility], [place] [address] [place], [place] [address]   Expectorated sputum assessment w rflx to resp cult     Status: None   Collection Time: 10/30/19  8:32 PM   Specimen: Sputum  Result Value Ref Range Status   Specimen Description SPU EXPECTORATED  Final   Special Requests NONE  Final   Sputum evaluation   Final    THIS SPECIMEN IS ACCEPTABLE FOR SPUTUM CULTURE Performed at [facility], [place] [address] [place], [place] [address]    Report Status 10/30/2019 FINAL  Final  Culture, respiratory     Status: None   Collection Time: 10/30/19  8:32 PM   Specimen: Sputum  Result Value Ref Range Status   Specimen Description   Final     SPU EXPECTORATED Performed at [facility] [address], [place], [place] [address]    Special Requests   Final    NONE Reflexed from [id number] Performed at [facility] [address], [place], [place] [address]    Gram Stain   Final    RARE WBC PRESENT, PREDOMINANTLY PMN FEW GRAM POSITIVE COCCI IN PAIRS    Culture   Final    Consistent with normal respiratory flora. Performed at [facility], [place] [address] [place], [place] [address]    Report Status 11/03/2019 FINAL  Final     Labs: Basic Metabolic Panel: Recent Labs  Lab 10/31/19 0500 11/01/19 0313 11/02/19 0500 11/03/19 0410 11/04/19 0240  NA 139 138 139 139 137  K 3.9 3.6 3.6 3.7 3.7  CL 103 103 104 103 102  CO2 27 27 26 26 25   GLUCOSE 133* 157* 149* 139* 155*  BUN 15 27* 29* 32* 37*  CREATININE 0.71 0.78 0.65 0.66 0.81  CALCIUM 8.3* 8.4* 8.9 9.1 8.9  MG 1.8  --   --   --   --    Liver Function Tests: Recent Labs  Lab 10/31/19 0500 11/01/19 0313 11/02/19 0500 11/03/19 0410 11/04/19 0240  AST 48* 54* 55* 50* 30  ALT 35 40 49* 58* 45*  ALKPHOS 62 56 62 64 53  BILITOT 0.7 0.7 0.7 0.8 0.6  PROT 5.9* 5.6* 6.3* 6.5 5.8*  ALBUMIN 2.8* 2.6* 3.1* 3.2* 2.7*   CBC: Recent Labs  Lab 10/30/19 1058 10/31/19 0500 11/01/19 0313 11/02/19 0500 11/03/19 0410 11/04/19 0240  WBC 9.0 4.4 6.4 10.9* 9.5 9.7  NEUTROABS 7.5  --   --   --   --   --   HGB 11.8* 10.5* 10.4* 11.4* 11.7* 10.9*  HCT 35.8* 31.8* 31.5* 34.5* 35.5* 33.5*  MCV 98.9 99.1 98.1 99.4 97.0 99.7  PLT 253 243 308 408* 543* 466*   CBG: No results for input(s): GLUCAP in the last 168 hours. Hgb A1c No results for input(s): HGBA1C in the last 72 hours. Lipid Profile No results for input(s): CHOL, HDL, LDLCALC, TRIG, CHOLHDL, LDLDIRECT in the last 72 hours. Thyroid function studies No results for input(s): TSH, T4TOTAL, T3FREE, THYROIDAB in the last 72 hours.  Invalid input(s): FREET3 Urinalysis      Component Value Date/Time   COLORURINE YELLOW 10/30/2019 [place] 10/30/2019 1049   LABSPEC 1.028 10/30/2019 1049   PHURINE 5.0 10/30/2019 1049   GLUCOSEU NEGATIVE 10/30/2019 1049   HGBUR SMALL (A) 10/30/2019 1049   BILIRUBINUR NEGATIVE 10/30/2019 1049   KETONESUR 20 (A) 10/30/2019 1049   PROTEINUR NEGATIVE 10/30/2019 1049   NITRITE NEGATIVE 10/30/2019 1049   LEUKOCYTESUR NEGATIVE 10/30/2019 1049    FURTHER DISCHARGE INSTRUCTIONS:   Get Medicines reviewed and adjusted: Please take all your medications with you for your next visit with your Primary MD   Laboratory/radiological data: Please request your Primary MD to go over all hospital tests and procedure/radiological results at the follow up, please ask your Primary MD to get all Hospital records sent to his/her office.   In some cases, they will be blood work, cultures and biopsy results pending at the time of your discharge. Please request that your primary care M.D. goes through all the records of your hospital data and follows up on these results.   Also Note the following: If you experience worsening of your admission symptoms, develop shortness of breath, life threatening emergency, suicidal or homicidal thoughts you must seek medical attention immediately by calling 911 or calling your MD immediately  if symptoms less severe.   You must read complete instructions/literature along with all the possible adverse reactions/side effects for all the Medicines you take and that have been prescribed to you. Take any new Medicines after you have completely understood and accpet all the possible adverse reactions/side effects.    Do not drive when taking Pain medications or sleeping medications (Benzodaizepines)   Do not take more than prescribed Pain, Sleep and Anxiety Medications. It is not advisable to combine anxiety,sleep and pain medications without talking with your primary care practitioner   Special  Instructions: If you have smoked or chewed Tobacco  in the last 2 yrs please stop smoking, stop any regular Alcohol  and or any Recreational drug use.   Wear Seat belts while driving.   Please note: You were cared for by a hospitalist during your hospital stay. Once you are discharged, your primary care physician  will handle any further medical issues. Please note that NO REFILLS for any discharge medications will be authorized once you are discharged, as it is imperative that you return to your primary care physician (or establish a relationship with a primary care physician if you do not have one) for your post hospital discharge needs so that they can reassess your need for medications and monitor your lab values.  Time coordinating discharge: 40 minutes  SIGNED:  Marzetta Board, MD, PhD 11/04/2019, 9:37 AM \

## 2019-11-04 NOTE — TOC Progression Note (Signed)
Transition of Care [facility]) - Progression Note    Patient Details  Name: MILINDA HOTTINGER MRN: [id number] Date of Birth: 1932/01/26  Transition of Care [facility] CM/SW Contact  Joaquin Courts, RN Phone Number: 11/04/2019, 9:46 AM  Clinical Narrative:  CM spoke with patient who reports she does not have a walker at home. [employer] to deliver rolling walker for home use.       Expected Discharge Plan: Home/Self Care Barriers to Discharge: No Barriers Identified  Expected Discharge Plan and Services Expected Discharge Plan: Home/Self Care   Discharge Planning Services: CM Consult Post Acute Care Choice: Durable Medical Equipment Living arrangements for the past 2 months: Single Family Home Expected Discharge Date: 11/04/19               DME Arranged: Gilford Rile rolling DME Agency: [employer] Date DME Agency Contacted: 11/04/19 Time DME Agency Contacted: [phone] Representative spoke with at DME Agency: [place] [facility] Arranged: NA [place] Agency: NA         Social Determinants of Health ([place]) Interventions    Readmission Risk Interventions No flowsheet data found.

## 2019-11-19 DIAGNOSIS — M25511 Pain in right shoulder: Secondary | ICD-10-CM | POA: Diagnosis not present

## 2019-11-19 DIAGNOSIS — M6281 Muscle weakness (generalized): Secondary | ICD-10-CM | POA: Diagnosis not present

## 2019-11-19 DIAGNOSIS — M25611 Stiffness of right shoulder, not elsewhere classified: Secondary | ICD-10-CM | POA: Diagnosis not present

## 2019-11-21 DIAGNOSIS — Z6821 Body mass index (BMI) 21.0-21.9, adult: Secondary | ICD-10-CM | POA: Diagnosis not present

## 2019-11-21 DIAGNOSIS — J410 Simple chronic bronchitis: Secondary | ICD-10-CM | POA: Diagnosis not present

## 2019-11-26 DIAGNOSIS — M6281 Muscle weakness (generalized): Secondary | ICD-10-CM | POA: Diagnosis not present

## 2019-11-26 DIAGNOSIS — M25511 Pain in right shoulder: Secondary | ICD-10-CM | POA: Diagnosis not present

## 2019-11-26 DIAGNOSIS — M25611 Stiffness of right shoulder, not elsewhere classified: Secondary | ICD-10-CM | POA: Diagnosis not present

## 2019-11-27 DIAGNOSIS — K2100 Gastro-esophageal reflux disease with esophagitis, without bleeding: Secondary | ICD-10-CM | POA: Diagnosis not present

## 2019-11-27 DIAGNOSIS — J449 Chronic obstructive pulmonary disease, unspecified: Secondary | ICD-10-CM | POA: Diagnosis not present

## 2019-11-27 DIAGNOSIS — G4733 Obstructive sleep apnea (adult) (pediatric): Secondary | ICD-10-CM | POA: Diagnosis not present

## 2019-11-27 DIAGNOSIS — E782 Mixed hyperlipidemia: Secondary | ICD-10-CM | POA: Diagnosis not present

## 2019-11-29 DIAGNOSIS — E782 Mixed hyperlipidemia: Secondary | ICD-10-CM | POA: Diagnosis not present

## 2019-12-05 DIAGNOSIS — S42254A Nondisplaced fracture of greater tuberosity of right humerus, initial encounter for closed fracture: Secondary | ICD-10-CM | POA: Diagnosis not present

## 2019-12-05 DIAGNOSIS — M25611 Stiffness of right shoulder, not elsewhere classified: Secondary | ICD-10-CM | POA: Diagnosis not present

## 2019-12-05 DIAGNOSIS — M6281 Muscle weakness (generalized): Secondary | ICD-10-CM | POA: Diagnosis not present

## 2019-12-05 DIAGNOSIS — M25511 Pain in right shoulder: Secondary | ICD-10-CM | POA: Diagnosis not present

## 2019-12-07 DIAGNOSIS — M25511 Pain in right shoulder: Secondary | ICD-10-CM | POA: Diagnosis not present

## 2019-12-07 DIAGNOSIS — M6281 Muscle weakness (generalized): Secondary | ICD-10-CM | POA: Diagnosis not present

## 2019-12-07 DIAGNOSIS — M25611 Stiffness of right shoulder, not elsewhere classified: Secondary | ICD-10-CM | POA: Diagnosis not present

## 2019-12-17 DIAGNOSIS — M25611 Stiffness of right shoulder, not elsewhere classified: Secondary | ICD-10-CM | POA: Diagnosis not present

## 2019-12-17 DIAGNOSIS — M6281 Muscle weakness (generalized): Secondary | ICD-10-CM | POA: Diagnosis not present

## 2019-12-17 DIAGNOSIS — M25511 Pain in right shoulder: Secondary | ICD-10-CM | POA: Diagnosis not present

## 2019-12-19 DIAGNOSIS — J449 Chronic obstructive pulmonary disease, unspecified: Secondary | ICD-10-CM | POA: Diagnosis not present

## 2019-12-27 ENCOUNTER — Other Ambulatory Visit: Payer: Self-pay

## 2019-12-27 DIAGNOSIS — J449 Chronic obstructive pulmonary disease, unspecified: Secondary | ICD-10-CM | POA: Diagnosis not present

## 2019-12-27 MED ORDER — ALBUTEROL SULFATE (2.5 MG/3ML) 0.083% IN NEBU: 2.5000 mg | INHALATION_SOLUTION | Freq: Three times a day (TID) | RESPIRATORY_TRACT | 3 refills | Status: DC

## 2020-01-16 DIAGNOSIS — S42254A Nondisplaced fracture of greater tuberosity of right humerus, initial encounter for closed fracture: Secondary | ICD-10-CM | POA: Diagnosis not present

## 2020-01-16 DIAGNOSIS — J449 Chronic obstructive pulmonary disease, unspecified: Secondary | ICD-10-CM | POA: Diagnosis not present

## 2020-01-21 DIAGNOSIS — J449 Chronic obstructive pulmonary disease, unspecified: Secondary | ICD-10-CM | POA: Diagnosis not present

## 2020-01-21 DIAGNOSIS — L820 Inflamed seborrheic keratosis: Secondary | ICD-10-CM | POA: Diagnosis not present

## 2020-01-21 DIAGNOSIS — C44319 Basal cell carcinoma of skin of other parts of face: Secondary | ICD-10-CM | POA: Diagnosis not present

## 2020-01-21 DIAGNOSIS — D2239 Melanocytic nevi of other parts of face: Secondary | ICD-10-CM | POA: Diagnosis not present

## 2020-01-31 ENCOUNTER — Other Ambulatory Visit: Payer: Self-pay | Admitting: Physician Assistant

## 2020-01-31 ENCOUNTER — Other Ambulatory Visit: Payer: Self-pay | Admitting: Legal Medicine

## 2020-01-31 DIAGNOSIS — C44319 Basal cell carcinoma of skin of other parts of face: Secondary | ICD-10-CM | POA: Diagnosis not present

## 2020-01-31 NOTE — Telephone Encounter (Signed)
This is your pt

## 2020-02-26 DIAGNOSIS — J449 Chronic obstructive pulmonary disease, unspecified: Secondary | ICD-10-CM | POA: Diagnosis not present

## 2020-03-19 IMAGING — DX DG CHEST 1V PORT
1 series · 1 of 1 positions shown · non-contrast
Comparison: 10/27/2019

CLINICAL DATA: COVID.  Weakness.

EXAM:
PORTABLE CHEST 1 VIEW

[chest ap]
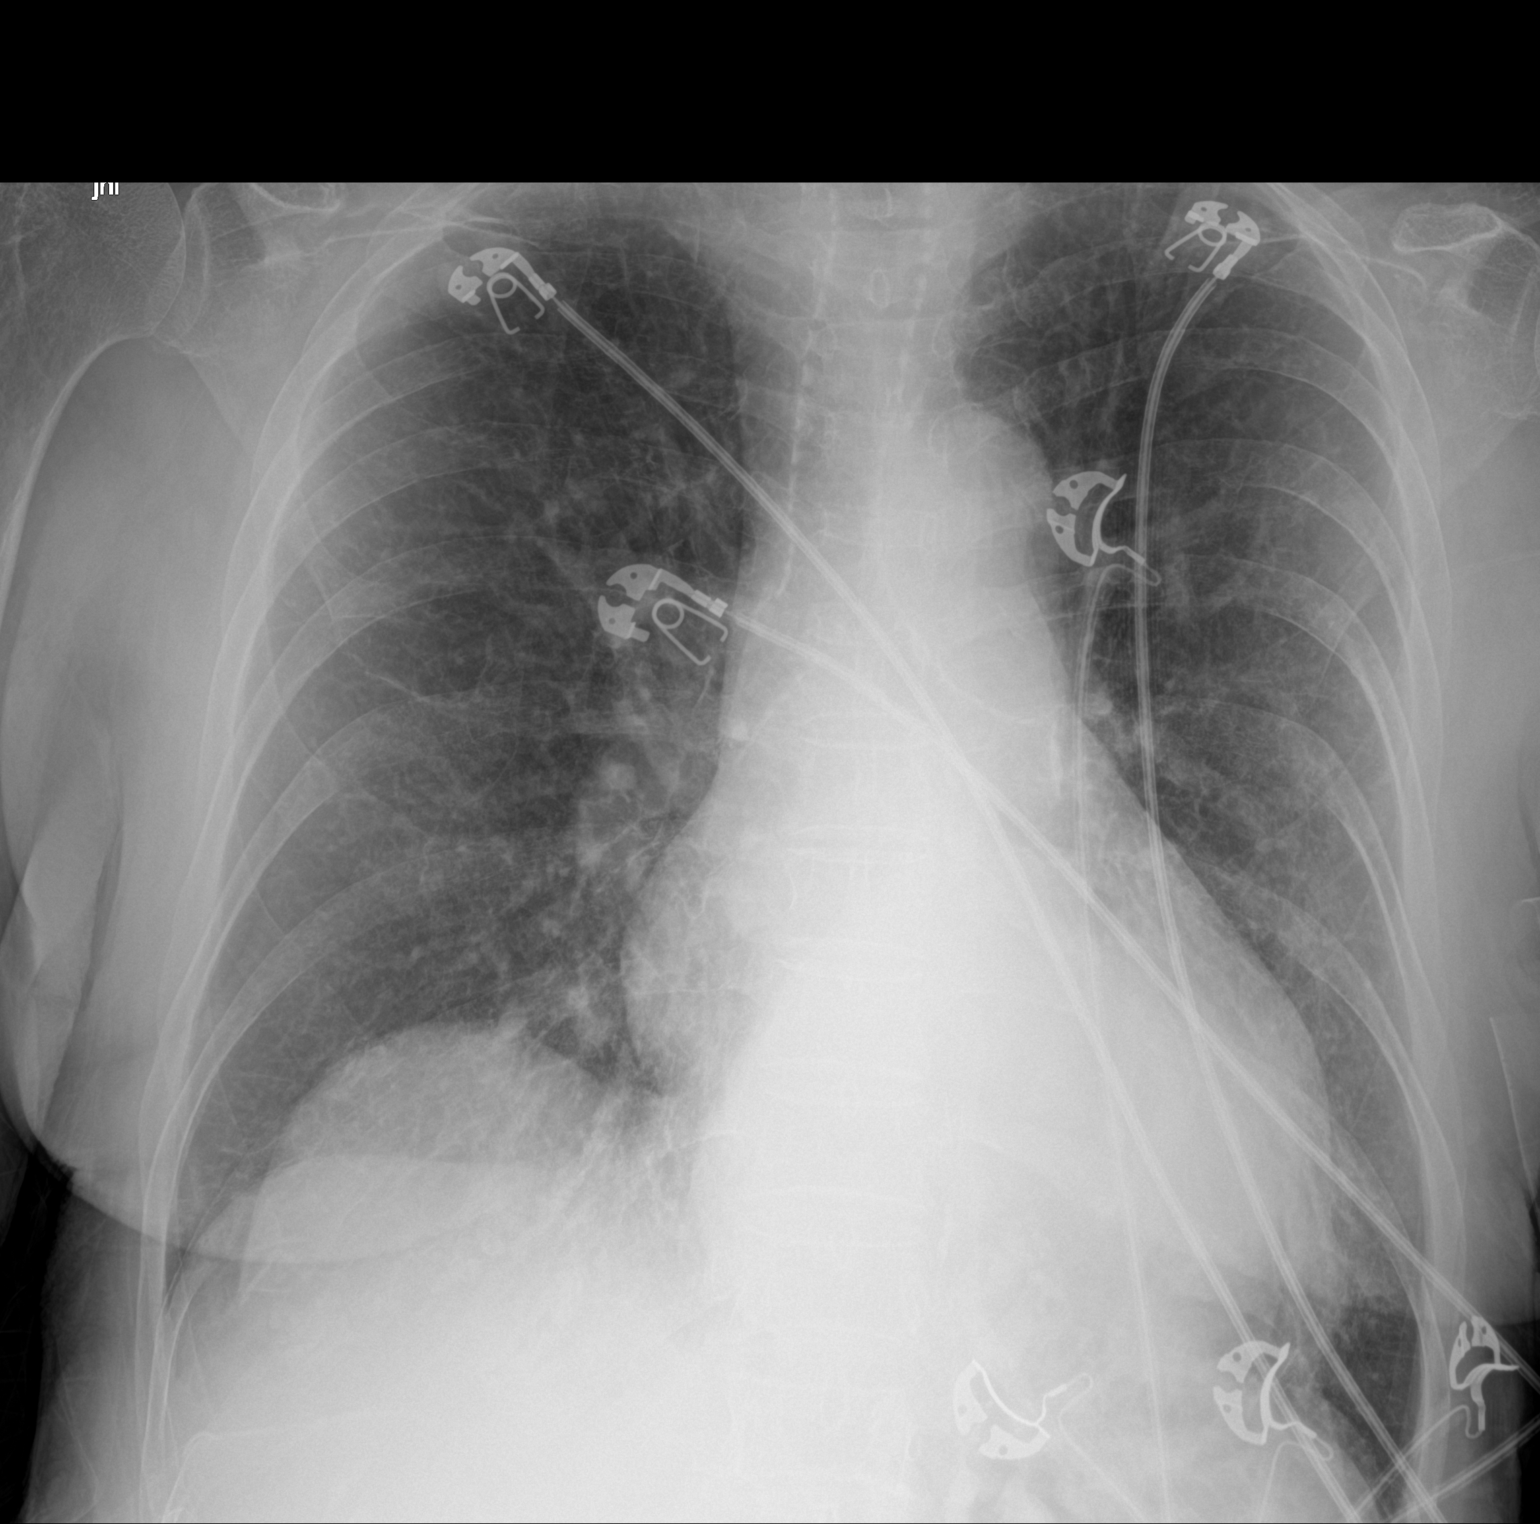

[1 of 1 positions shown; findings below may reference images not displayed]

FINDINGS: Cardiac silhouette normal in size. Moderate to large hiatal hernia.
No mediastinal or hilar masses or evidence of adenopathy.

Prominent bronchovascular and interstitial markings similar to the
prior study. Lungs otherwise clear.

No pleural effusion or pneumothorax.

Skeletal structures are grossly intact.
IMPRESSION: 1. No acute cardiopulmonary disease. Stable appearance from the
recent prior study.

## 2020-03-19 IMAGING — CT CT ANGIO CHEST
2 of 6 series · 18 of 36 positions shown · IV contrast (omnipaque)
Comparison: Single-view of the chest 10/30/2019. PA and lateral
chest 02/17/2017.

CLINICAL DATA: Decreased oxygen saturation. Cough. Negative
[id number] test today.

EXAM:
CT ANGIOGRAPHY CHEST WITH CONTRAST
TECHNIQUE: Multidetector CT imaging of the chest was performed using the
standard protocol during bolus administration of intravenous
contrast. Multiplanar CT image reconstructions and MIPs were
obtained to evaluate the vascular anatomy.
CONTRAST:  100 mL OMNIPAQUE IOHEXOL 350 MG/ML SOLN

[Series 5: thins · axial · 0.61mm/px · z∈[+431,+679]mm · 17 of 280 slices shown]
[im 16/280  lung]
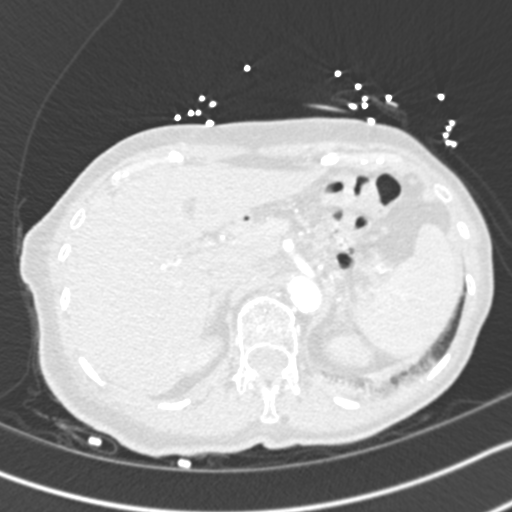
[im 32/280  mediastinal]
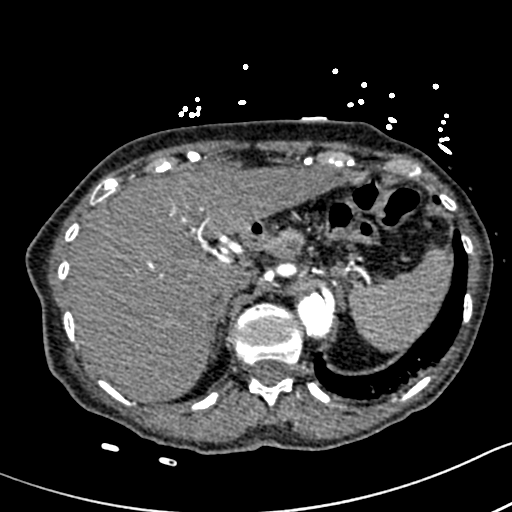
[im 47/280  lung]
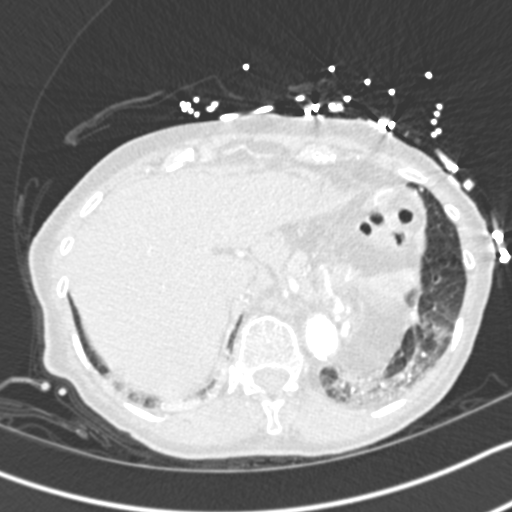
[im 63/280  mediastinal]
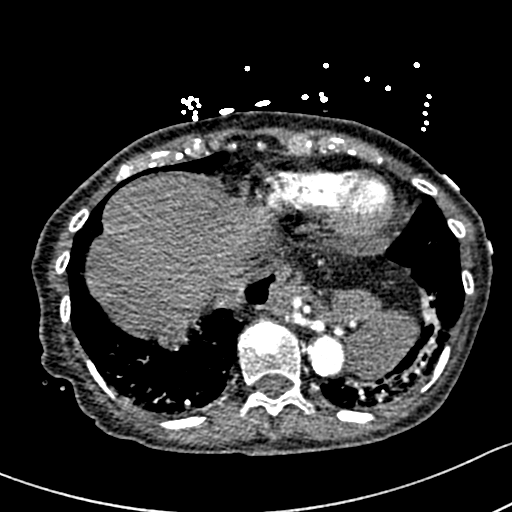
[im 78/280  lung]
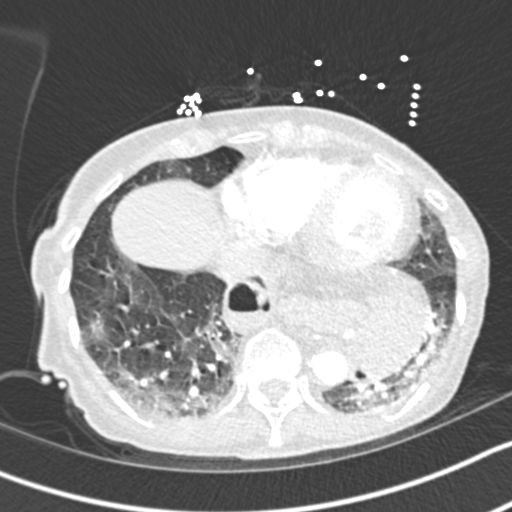
[im 94/280  mediastinal]
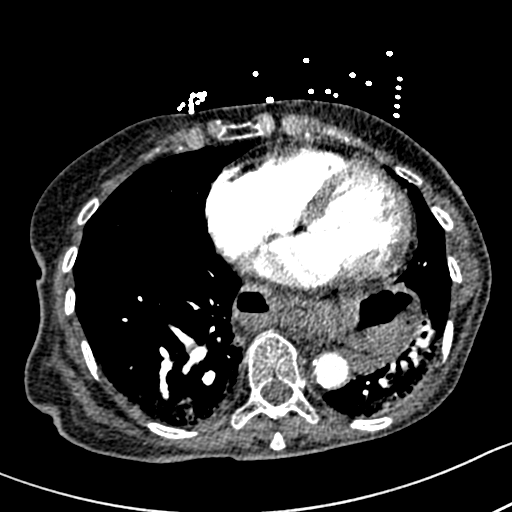
[im 109/280  lung]
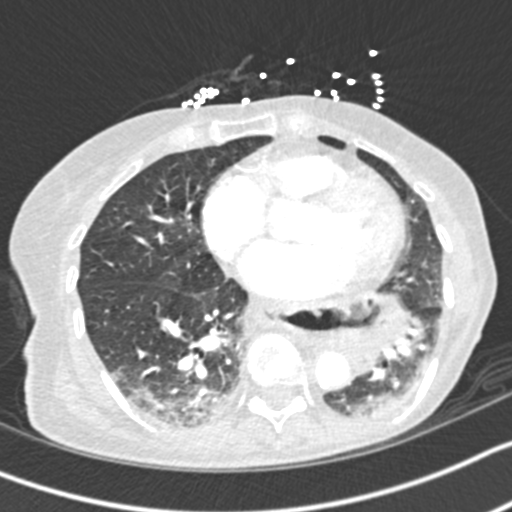
[im 125/280  mediastinal]
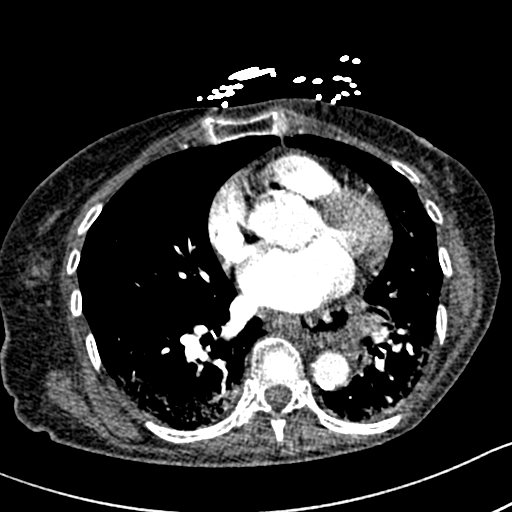
[im 140/280  lung]
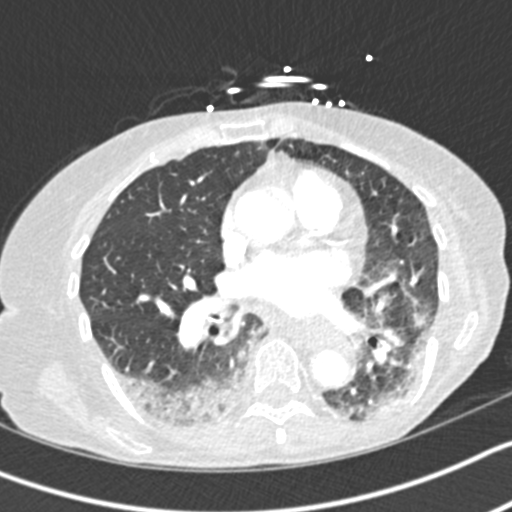
[im 156/280  mediastinal]
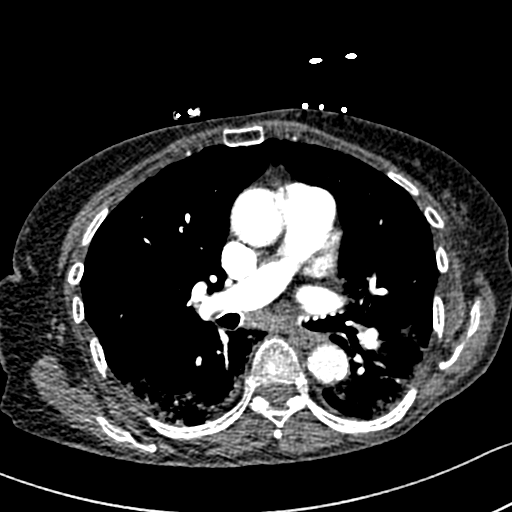
[im 171/280  lung]
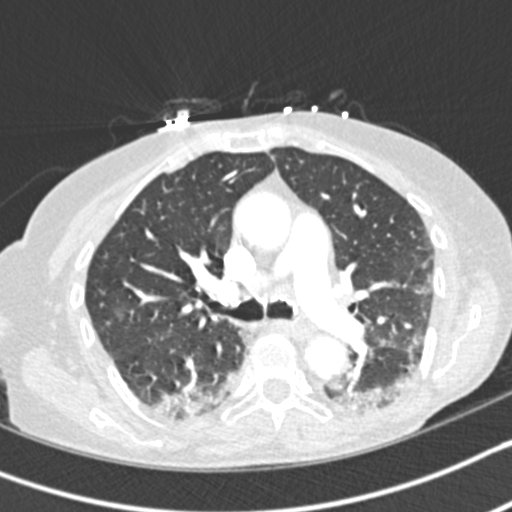
[im 187/280  mediastinal]
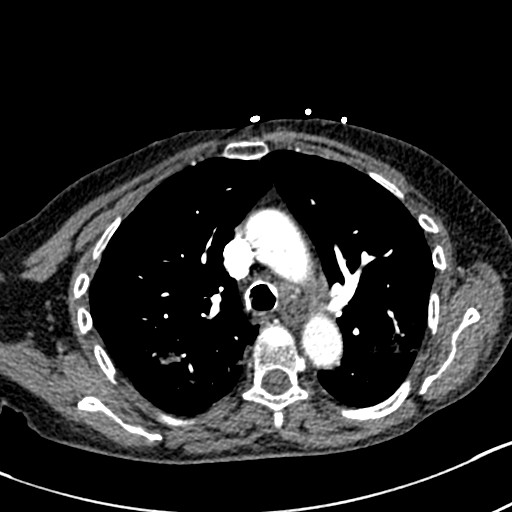
[im 202/280  lung]
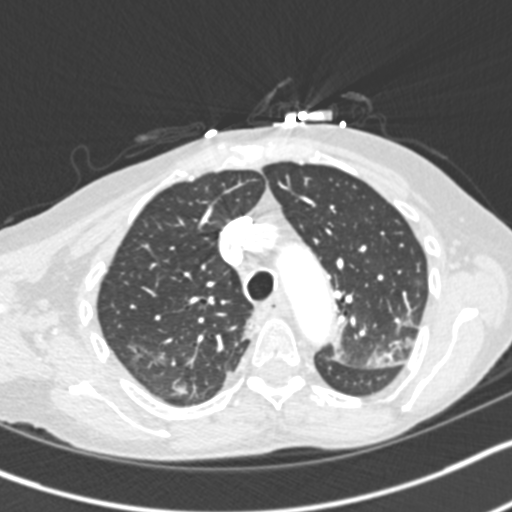
[im 218/280  mediastinal]
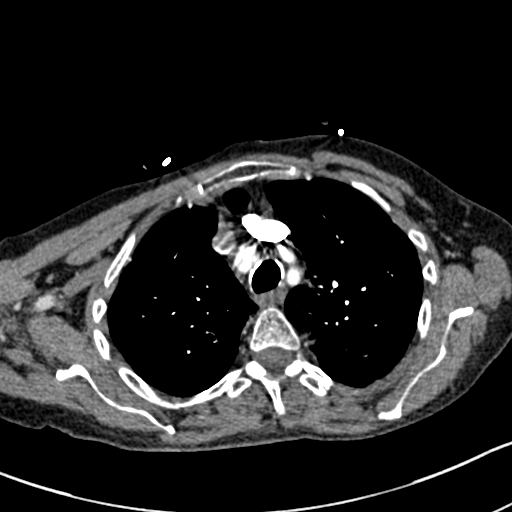
[im 233/280  lung]
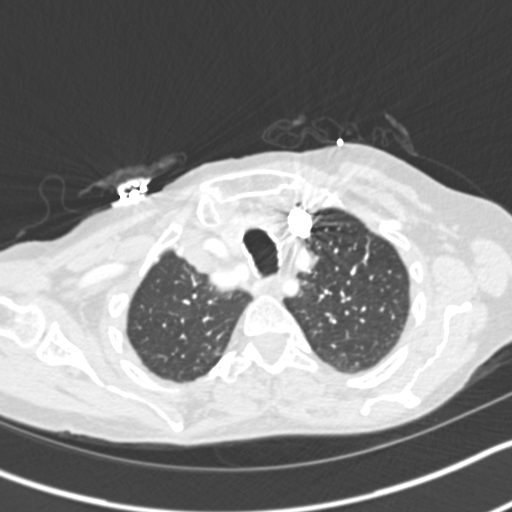
[im 249/280  mediastinal]
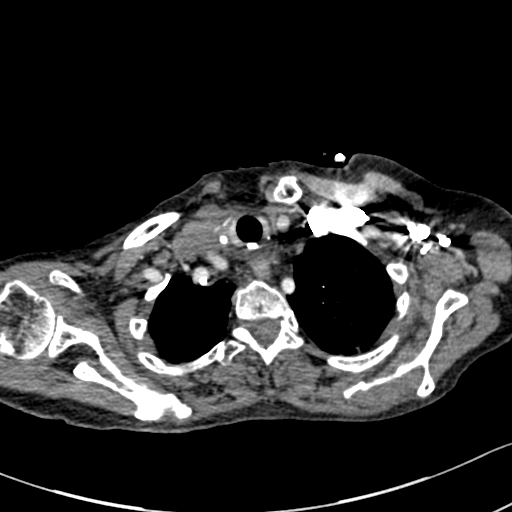
[im 264/280  lung]
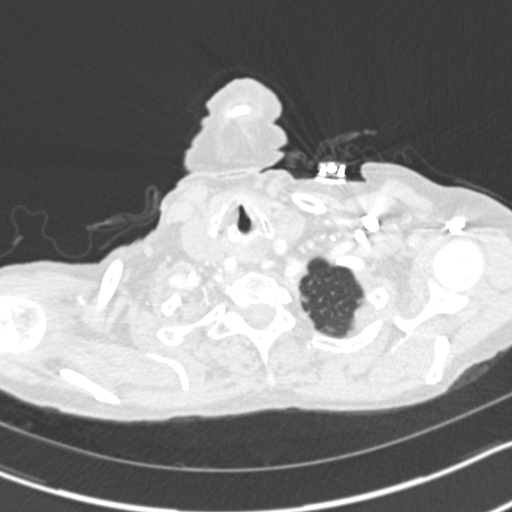

[Series 7: coronal mpr · coronal · 0.56mm/px · 1 of 111 slices shown]
[im 56/111  mediastinal]
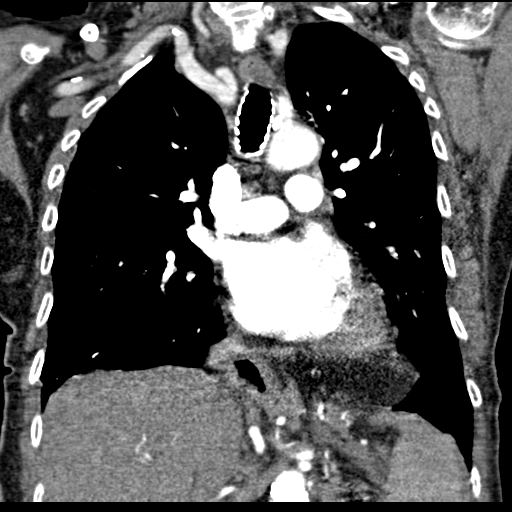

[18 of 36 positions shown; findings below may reference images not displayed]

FINDINGS: Cardiovascular: No pulmonary embolus is identified. Heart size is
upper normal. No pericardial effusion. Calcific aortic and coronary
atherosclerosis noted.

Mediastinum/Nodes: No enlarged mediastinal, hilar, or axillary lymph
nodes. Thyroid gland, trachea, and esophagus demonstrate no
significant findings. Large hiatal hernia is identified.

Lungs/Pleura: No pleural effusion. Patchy areas of ground-glass
opacity are seen in the upper lobes bilaterally. There is also
dependent bilateral ground-glass opacity.

Upper Abdomen: Negative.

Musculoskeletal: No acute or focal abnormality.

Review of the MIP images confirms the above findings.
IMPRESSION: Negative for pulmonary embolus.

Patchy areas of ground-glass opacity in the upper lobes bilaterally
are worrisome for pneumonia including atypical/viral infection.
Bilateral dependent ground-glass opacity could also reflect
pneumonia but may also be due to atelectasis.

Large hiatal hernia.

Aortic Atherosclerosis ([id number]). Calcific coronary artery
disease also noted.

## 2020-04-23 DIAGNOSIS — J449 Chronic obstructive pulmonary disease, unspecified: Secondary | ICD-10-CM | POA: Diagnosis not present

## 2020-04-24 NOTE — Progress Notes (Signed)
Chief Complaint:  cougn  History of Present Illness:    Brenda Patrick is a 86 y.o. female with a cough for 2 weeks.  It is a nagging cough.  Minimally productive.  Denies fevers, chills, sweats, earaches, sore throat, or shortness of breath.  She has been taking her allergy medicines religiously.  She denies acid reflux.  She reports that this happens every year about this time and relates that to allergies which gets into her chest.  The patient does not have symptoms concerning for COVID-19 infection (fever, chills, cough, or new shortness of breath).    Past Medical History:  Diagnosis Date  . Hypertension     No past surgical history on file.  No family history on file.  Social History   Socioeconomic History  . Marital status: Married    Spouse name: Not on file  . Number of children: Not on file  . Years of education: Not on file  . Highest education level: Not on file  Occupational History  . Not on file  Tobacco Use  . Smoking status: Never Smoker  . Smokeless tobacco: Never Used  Substance and Sexual Activity  . Alcohol use: Not on file  . Drug use: Not on file  . Sexual activity: Not on file  Other Topics Concern  . Not on file  Social History Narrative  . Not on file   Social Determinants of Health   Financial Resource Strain:   . Difficulty of Paying Living Expenses:   Food Insecurity:   . Worried About Charity fundraiser in the Last Year:   . Arboriculturist in the Last Year:   Transportation Needs:   . Film/video editor (Medical):   Marland Kitchen Lack of Transportation (Non-Medical):   Physical Activity:   . Days of Exercise per Week:   . Minutes of Exercise per Session:   Stress:   . Feeling of Stress :   Social Connections:   . Frequency of Communication with Friends and Family:   . Frequency of Social Gatherings with Friends and Family:   . Attends Religious Services:   . Active Member of Clubs or Organizations:   . Attends Theatre manager Meetings:   Marland Kitchen Marital Status:   Intimate Partner Violence:   . Fear of Current or Ex-Partner:   . Emotionally Abused:   Marland Kitchen Physically Abused:   . Sexually Abused:     Outpatient Medications Prior to Visit  Medication Sig Dispense Refill  . albuterol (PROVENTIL) (2.5 MG/3ML) 0.083% nebulizer solution Take 3 mLs (2.5 mg total) by nebulization 3 (three) times daily. 75 mL 3  . atenolol (TENORMIN) 50 MG tablet Take 50 mg by mouth daily.     . busPIRone (BUSPAR) 7.5 MG tablet Take 1 tablet (7.5 mg total) by mouth 2 (two) times daily. 60 tablet 0  . citalopram (CELEXA) 40 MG tablet TAKE 1 TABLET BY MOUTH DAILY 90 tablet 1  . clonazePAM (KLONOPIN) 0.5 MG tablet Take 1 tablet (0.5 mg total) by mouth 2 (two) times daily. 60 tablet 0  . dexamethasone (DECADRON) 6 MG tablet Take 1 tablet (6 mg total) by mouth daily. 5 tablet 0  . fluticasone (FLONASE) 50 MCG/ACT nasal spray Place 2 sprays into both nostrils daily.     Marland Kitchen losartan-hydrochlorothiazide (HYZAAR) 100-12.5 MG tablet Take 1 tablet by mouth daily.     . Multiple Vitamins-Minerals (MULTIVITAMIN WOMEN 50+ PO) Take 1 tablet by mouth daily.    Marland Kitchen  simvastatin (ZOCOR) 20 MG tablet TAKE 1 TABLET BY MOUTH EVERY DAY 90 tablet 2   No facility-administered medications prior to visit.   .med Allergies:   Patient has no known allergies.   Social History   Tobacco Use  . Smoking status: Never Smoker  . Smokeless tobacco: Never Used  Substance Use Topics  . Alcohol use: Not on file  . Drug use: Not on file     Review of Systems  Constitutional: Positive for malaise/fatigue. Negative for chills and fever.  HENT: Negative for ear pain, sinus pain and sore throat.   Respiratory: Positive for cough (productive clear mucous) and wheezing. Negative for shortness of breath.   Cardiovascular: Negative for chest pain.  Gastrointestinal: Negative for abdominal pain, diarrhea, nausea and vomiting.  Genitourinary: Negative for dysuria.    Musculoskeletal: Negative for myalgias.  Neurological: Negative for headaches.  Psychiatric/Behavioral: Positive for depression.     Labs/Other Tests and Data Reviewed:    Recent Labs: 10/31/2019: B Natriuretic Peptide 329.9; Magnesium 1.8 11/04/2019: ALT 45; BUN 37; Creatinine, Ser 0.81; Hemoglobin 10.9; Platelets 466; Potassium 3.7; Sodium 137   Recent Lipid Panel No results found for: CHOL, TRIG, HDL, CHOLHDL, LDLCALC, LDLDIRECT  Wt Readings from Last 3 Encounters:  10/30/19 115 lb 15.4 oz (52.6 kg)  10/03/18 115 lb (52.2 kg)     Objective:   Physical Exam Vitals reviewed.  Constitutional:      Appearance: Normal appearance. She is normal weight.  HENT:     Right Ear: Tympanic membrane normal.     Left Ear: Tympanic membrane normal.     Nose: Congestion present. No rhinorrhea.     Mouth/Throat:     Mouth: Mucous membranes are moist.     Pharynx: No oropharyngeal exudate or posterior oropharyngeal erythema.  Cardiovascular:     Heart sounds: Normal heart sounds.  Pulmonary:     Effort: No respiratory distress.     Breath sounds: Normal breath sounds.  Neurological:     Mental Status: She is alert.   BP (!) 108/56   Pulse 60   Temp 97.7 F (36.5 C)   Ht 5\' 2"  (1.575 m)   Wt 111 lb (50.3 kg)   BMI 20.30 kg/m   ASSESSMENT & PLAN:   1. Acute bronchitis due to other specified organisms Amoxicillin sent hydromet sent Rest and fluids.    Follow Up:  In Person prn  Signed, Arsenio Katz, CMA  04/24/2020 9:16 PM    [place]

## 2020-04-25 ENCOUNTER — Telehealth (INDEPENDENT_AMBULATORY_CARE_PROVIDER_SITE_OTHER): Payer: Medicare Other | Admitting: Family Medicine

## 2020-04-25 ENCOUNTER — Other Ambulatory Visit: Payer: Self-pay

## 2020-04-25 ENCOUNTER — Encounter: Payer: Self-pay | Admitting: Family Medicine

## 2020-04-25 VITALS — BP 108/56 | HR 60 | Temp 97.7°F | Ht 62.0 in | Wt 111.0 lb

## 2020-04-25 DIAGNOSIS — J208 Acute bronchitis due to other specified organisms: Secondary | ICD-10-CM | POA: Diagnosis not present

## 2020-04-25 HISTORY — DX: Acute bronchitis due to other specified organisms: J20.8

## 2020-04-25 MED ORDER — AMOXICILLIN 875 MG PO TABS: 875.0000 mg | ORAL_TABLET | Freq: Two times a day (BID) | ORAL | 0 refills | Status: DC

## 2020-04-25 MED ORDER — HYDROCODONE-HOMATROPINE 5-1.5 MG/5ML PO SYRP: 5.0000 mL | ORAL_SOLUTION | Freq: Three times a day (TID) | ORAL | 0 refills | Status: DC | PRN

## 2020-04-28 ENCOUNTER — Other Ambulatory Visit: Payer: Self-pay | Admitting: Legal Medicine

## 2020-04-30 ENCOUNTER — Other Ambulatory Visit: Payer: Self-pay

## 2020-04-30 MED ORDER — ATENOLOL 50 MG PO TABS: 50.0000 mg | ORAL_TABLET | Freq: Every day | ORAL | 1 refills | Status: DC

## 2020-04-30 MED ORDER — LOSARTAN POTASSIUM-HCTZ 100-12.5 MG PO TABS: 1.0000 | ORAL_TABLET | Freq: Every day | ORAL | 1 refills | Status: DC

## 2020-05-26 ENCOUNTER — Other Ambulatory Visit: Payer: Self-pay

## 2020-05-26 ENCOUNTER — Ambulatory Visit (INDEPENDENT_AMBULATORY_CARE_PROVIDER_SITE_OTHER): Payer: Medicare Other | Admitting: Legal Medicine

## 2020-05-26 ENCOUNTER — Encounter: Payer: Self-pay | Admitting: Legal Medicine

## 2020-05-26 DIAGNOSIS — E782 Mixed hyperlipidemia: Secondary | ICD-10-CM

## 2020-05-26 DIAGNOSIS — K2100 Gastro-esophageal reflux disease with esophagitis, without bleeding: Secondary | ICD-10-CM | POA: Diagnosis not present

## 2020-05-26 DIAGNOSIS — F320 Major depressive disorder, single episode, mild: Secondary | ICD-10-CM

## 2020-05-26 DIAGNOSIS — E440 Moderate protein-calorie malnutrition: Secondary | ICD-10-CM

## 2020-05-26 DIAGNOSIS — G4733 Obstructive sleep apnea (adult) (pediatric): Secondary | ICD-10-CM | POA: Diagnosis not present

## 2020-05-26 DIAGNOSIS — J449 Chronic obstructive pulmonary disease, unspecified: Secondary | ICD-10-CM | POA: Insufficient documentation

## 2020-05-26 DIAGNOSIS — Z6820 Body mass index (BMI) 20.0-20.9, adult: Secondary | ICD-10-CM | POA: Insufficient documentation

## 2020-05-26 HISTORY — DX: Gastro-esophageal reflux disease with esophagitis, without bleeding: K21.00

## 2020-05-26 HISTORY — DX: Major depressive disorder, single episode, mild: F32.0

## 2020-05-26 HISTORY — DX: Chronic obstructive pulmonary disease, unspecified: J44.9

## 2020-05-26 HISTORY — DX: Mixed hyperlipidemia: E78.2

## 2020-05-26 HISTORY — DX: Obstructive sleep apnea (adult) (pediatric): G47.33

## 2020-05-26 HISTORY — DX: Moderate protein-calorie malnutrition: E44.0

## 2020-05-26 MED ORDER — OMEPRAZOLE 40 MG PO CPDR: 40.0000 mg | DELAYED_RELEASE_CAPSULE | Freq: Every day | ORAL | 2 refills | Status: DC

## 2020-05-26 NOTE — Progress Notes (Signed)
Subjective:  Patient ID: Brenda Patrick, female    DOB: [birth year]  Age: 86 y.o. MRN: [id number]  Chief Complaint  Patient presents with  . Hyperlipidemia  . Depression  . Gastroesophageal Reflux    HPI: chronic visit.  Patient presents with hyperlipidemia.  Compliance with treatment has been good; patient takes medicines as directed, maintains low cholesterol diet, follows up as directed, and maintains exercise regimen.  Patient is using simvastatin without problems.  This patient has major depression for 6 months.  PHQ9 =11.  Patient is having less anhedonia.  The patient has improving future plans and prospects.  The depression is worse with memory of spouse.  The patient is not exercising and working on behavior to improve mental health.  Patient is not seeing a therapist or psychiatrist.  ba  Patient is on celexa.   Patient has gastroesophageal reflux symptoms withesophagitis and LTRD.  The symptoms are mild intensity.  Length of symptoms 10 years.  Medicines include omeprazole.  Complications include none. Current Outpatient Medications on File Prior to Visit  Medication Sig Dispense Refill  . albuterol (PROVENTIL) (2.5 MG/3ML) 0.083% nebulizer solution Take 3 mLs (2.5 mg total) by nebulization 3 (three) times daily. 75 mL 3  . atenolol (TENORMIN) 50 MG tablet Take 1 tablet (50 mg total) by mouth daily. 90 tablet 1  . busPIRone (BUSPAR) 10 MG tablet Take 10 mg by mouth 3 (three) times daily.    . citalopram (CELEXA) 40 MG tablet TAKE 1 TABLET BY MOUTH DAILY 90 tablet 1  . fluticasone (FLONASE) 50 MCG/ACT nasal spray Place 2 sprays into both nostrils daily.     Marland Kitchen HYDROcodone-homatropine (HYCODAN) 5-1.5 MG/5ML syrup Take 5 mLs by mouth every 8 (eight) hours as needed for cough. 120 mL 0  . LORazepam (ATIVAN) 1 MG tablet TAKE 1 TABLET BY MOUTH TWICE DAILY AS NEEDED 60 tablet 3  . losartan-hydrochlorothiazide (HYZAAR) 100-12.5 MG tablet Take 1 tablet by mouth daily. 90 tablet 1  .  Multiple Vitamins-Minerals (MULTIVITAMIN WOMEN 50+ PO) Take 1 tablet by mouth daily.    . simvastatin (ZOCOR) 20 MG tablet TAKE 1 TABLET BY MOUTH EVERY DAY 90 tablet 2   No current facility-administered medications on file prior to visit.   Past Medical History:  Diagnosis Date  . Acute bronchitis due to other specified organisms 04/25/2020  . COPD (chronic obstructive pulmonary disease) ([place]) 05/26/2020  . COVID-19 10/30/2019  . Gastroesophageal reflux disease with esophagitis 05/26/2020  . Hypertension   . Major depressive disorder, single episode, mild ([place]) 05/26/2020  . Mixed hyperlipidemia 05/26/2020  . Moderate protein-calorie malnutrition ([place]) 05/26/2020  . OSA (obstructive sleep apnea) 05/26/2020   Past Surgical History:  Procedure Laterality Date  . ABDOMINAL HYSTERECTOMY    . OVARIAN CYST REMOVAL      History reviewed. No pertinent family history. Social History   Socioeconomic History  . Marital status: Married    Spouse name: Not on file  . Number of children: Not on file  . Years of education: Not on file  . Highest education level: Not on file  Occupational History  . Occupation: Homemaker  Tobacco Use  . Smoking status: Never Smoker  . Smokeless tobacco: Never Used  Substance and Sexual Activity  . Alcohol use: Never  . Drug use: Not on file  . Sexual activity: Not on file  Other Topics Concern  . Not on file  Social History Narrative  . Not on file   Social Determinants  of Health   Financial Resource Strain:   . Difficulty of Paying Living Expenses:   Food Insecurity:   . Worried About Charity fundraiser in the Last Year:   . Arboriculturist in the Last Year:   Transportation Needs:   . Film/video editor (Medical):   Marland Kitchen Lack of Transportation (Non-Medical):   Physical Activity:   . Days of Exercise per Week:   . Minutes of Exercise per Session:   Stress:   . Feeling of Stress :   Social Connections:   . Frequency of Communication with  Friends and Family:   . Frequency of Social Gatherings with Friends and Family:   . Attends Religious Services:   . Active Member of Clubs or Organizations:   . Attends Archivist Meetings:   Marland Kitchen Marital Status:     Review of Systems  Constitutional: Negative.   HENT: Negative.   Eyes: Negative.   Respiratory: Negative.   Cardiovascular: Negative.   Gastrointestinal: Negative.   Endocrine: Negative.   Genitourinary: Negative.   Musculoskeletal: Negative.   Skin: Negative.   Neurological: Negative.   Hematological: Negative.   Psychiatric/Behavioral: Positive for dysphoric mood.     Objective:  BP 120/78   Pulse 61   Temp (!) 97.3 F (36.3 C)   Resp 16   Ht 5\' 2"  (1.575 m)   Wt 112 lb (50.8 kg)   SpO2 95%   BMI 20.49 kg/m   BP/Weight 05/26/2020 04/25/2020 84/66/5993  Systolic BP 570 177 939  Diastolic BP 78 56 58  Wt. (Lbs) 112 111 -  BMI 20.49 20.3 -    Physical Exam Vitals reviewed.  Constitutional:      Appearance: Normal appearance.  HENT:     Head: Normocephalic and atraumatic.     Right Ear: Tympanic membrane normal.     Left Ear: Tympanic membrane normal.     Mouth/Throat:     Mouth: Mucous membranes are moist.     Pharynx: Oropharynx is clear.  Eyes:     Extraocular Movements: Extraocular movements intact.     Pupils: Pupils are equal, round, and reactive to light.  Cardiovascular:     Rate and Rhythm: Normal rate and regular rhythm.     Pulses: Normal pulses.     Heart sounds: Normal heart sounds.  Pulmonary:     Effort: Pulmonary effort is normal.     Breath sounds: Normal breath sounds.  Abdominal:     General: Abdomen is flat. Bowel sounds are normal.  Musculoskeletal:        General: Normal range of motion.     Cervical back: Normal range of motion.  Skin:    General: Skin is warm and dry.     Capillary Refill: Capillary refill takes less than 2 seconds.  Neurological:     Mental Status: She is alert and oriented to person,  place, and time.  Psychiatric:        Mood and Affect: Mood normal.        Thought Content: Thought content normal.    Depression screen PHQ 2/9 05/26/2020  Decreased Interest 1  Down, Depressed, Hopeless 2  PHQ - 2 Score 3  Altered sleeping 0  Tired, decreased energy 2  Change in appetite 2  Feeling bad or failure about yourself  2  Trouble concentrating 2  Moving slowly or fidgety/restless 0  Suicidal thoughts 0  PHQ-9 Score 11  Lab Results  Component Value Date   WBC 9.7 11/04/2019   HGB 10.9 (L) 11/04/2019   HCT 33.5 (L) 11/04/2019   PLT 466 (H) 11/04/2019   GLUCOSE 155 (H) 11/04/2019   ALT 45 (H) 11/04/2019   AST 30 11/04/2019   NA 137 11/04/2019   K 3.7 11/04/2019   CL 102 11/04/2019   CREATININE 0.81 11/04/2019   BUN 37 (H) 11/04/2019   CO2 25 11/04/2019      Assessment & Plan:   1. Gastroesophageal reflux disease with esophagitis without hemorrhage - omeprazole (PRILOSEC) 40 MG capsule; Take 1 capsule (40 mg total) by mouth daily.  Dispense: 90 capsule; Refill: 2 Plan of care was formulated today.  She is doing well.  A plan of care was formulated using patient exam, tests and other sources to optimize care using evidence based information.  Recommend no smoking, no eating after supper, avoid fatty foods, elevate Head of bed, avoid tight fitting clothing.  Continue on omeprazole.  2. Mixed hyperlipidemia - CBC with Differential/Platelet - Comprehensive metabolic panel - Lipid panel AN INDIVIDUAL CARE PLAN for hyperlipidemia/ cholesterol was established and reinforced today.  The patient's status was assessed using clinical findings on exam, lab and other diagnostic tests. The patient's disease status was assessed based on evidence-based guidelines and found to be well controlled. MEDICATIONS were reviewed. SELF MANAGEMENT GOALS have been discussed and patient's success at attaining the goal of low cholesterol was assessed. RECOMMENDATION given  include regular exercise 3 days a week and low cholesterol/low fat diet. CLINICAL SUMMARY including written plan to identify barriers unique to the patient due to social or economic  reasons was discussed.  3. Major depressive disorder, single episode, mild ([place]) Patient's depression is controlled with celexa.   Anhedonia better.  PHQ 9 was performed score 11. An individual care plan was established or reinforced today.  The patient's disease status was assessed using clinical findings on exam, labs, and or other diagnostic testing to determine patient's success in meeting treatment goals based on disease specific evidence-based guidelines and found to be improving Recommendations include stay on medicine  4. OSA (obstructive sleep apnea) Using CPAP consistently every night and medically benefiting from its use.  5. Chronic obstructive pulmonary disease, unspecified COPD type ([place]) An individualize plan was formulated for care of COPD.  Treatment is evidence based.  She will continue on inhalers, avoid smoking and smoke.  Regular exercise with help with dyspnea. Routine follow ups and medication compliance is needed.  6. Moderate protein-calorie malnutrition ([place]) Supplement nutrition with protein/calorie supplement with meals to improve nutritional status.  7. BMI 20.0-20.9, adult Supplement nutrition with protein/calorie supplement with meals to improve nutritional status.    Meds ordered this encounter  Medications  . omeprazole (PRILOSEC) 40 MG capsule    Sig: Take 1 capsule (40 mg total) by mouth daily.    Dispense:  90 capsule    Refill:  2    Orders Placed This Encounter  Procedures  . CBC with Differential/Platelet  . Comprehensive metabolic panel  . Lipid panel     Follow-up: Return in about 6 months (around 11/26/2020) for fasting and see KIM.  An After Visit Summary was printed and given to the patient.  [place] [phone]

## 2020-05-27 DIAGNOSIS — J449 Chronic obstructive pulmonary disease, unspecified: Secondary | ICD-10-CM | POA: Diagnosis not present

## 2020-07-01 DIAGNOSIS — J449 Chronic obstructive pulmonary disease, unspecified: Secondary | ICD-10-CM | POA: Diagnosis not present

## 2020-07-29 ENCOUNTER — Other Ambulatory Visit: Payer: Self-pay | Admitting: Legal Medicine

## 2020-08-09 ENCOUNTER — Other Ambulatory Visit: Payer: Self-pay | Admitting: Legal Medicine

## 2020-08-11 DIAGNOSIS — J449 Chronic obstructive pulmonary disease, unspecified: Secondary | ICD-10-CM | POA: Diagnosis not present

## 2020-08-17 DIAGNOSIS — J449 Chronic obstructive pulmonary disease, unspecified: Secondary | ICD-10-CM | POA: Diagnosis not present

## 2020-09-17 DIAGNOSIS — J449 Chronic obstructive pulmonary disease, unspecified: Secondary | ICD-10-CM | POA: Diagnosis not present

## 2020-09-22 ENCOUNTER — Ambulatory Visit (INDEPENDENT_AMBULATORY_CARE_PROVIDER_SITE_OTHER): Payer: Medicare Other

## 2020-09-22 ENCOUNTER — Other Ambulatory Visit: Payer: Self-pay

## 2020-09-22 DIAGNOSIS — Z23 Encounter for immunization: Secondary | ICD-10-CM | POA: Diagnosis not present

## 2020-09-29 DIAGNOSIS — J449 Chronic obstructive pulmonary disease, unspecified: Secondary | ICD-10-CM | POA: Diagnosis not present

## 2020-10-17 DIAGNOSIS — J449 Chronic obstructive pulmonary disease, unspecified: Secondary | ICD-10-CM | POA: Diagnosis not present

## 2020-10-20 ENCOUNTER — Encounter: Payer: Self-pay | Admitting: Legal Medicine

## 2020-10-21 ENCOUNTER — Ambulatory Visit (INDEPENDENT_AMBULATORY_CARE_PROVIDER_SITE_OTHER): Payer: Medicare Other | Admitting: Legal Medicine

## 2020-10-21 ENCOUNTER — Other Ambulatory Visit: Payer: Self-pay

## 2020-10-21 ENCOUNTER — Encounter: Payer: Self-pay | Admitting: Legal Medicine

## 2020-10-21 VITALS — BP 118/54 | HR 66 | Temp 97.5°F | Resp 16 | Ht 62.0 in | Wt 114.4 lb

## 2020-10-21 DIAGNOSIS — Z0000 Encounter for general adult medical examination without abnormal findings: Secondary | ICD-10-CM | POA: Diagnosis not present

## 2020-10-21 DIAGNOSIS — N393 Stress incontinence (female) (male): Secondary | ICD-10-CM | POA: Diagnosis not present

## 2020-10-21 DIAGNOSIS — Z13820 Encounter for screening for osteoporosis: Secondary | ICD-10-CM | POA: Diagnosis not present

## 2020-10-21 NOTE — Progress Notes (Signed)
Subjective:  Patient ID: Brenda Patrick, female    DOB: 27-Jun-1932  Age: 86 y.o. MRN: [id number]  Chief Complaint  Patient presents with  . Annual Exam    HPI Encounter for general adult medical examination without abnormal findings  Physical ("At Risk" items are starred): Patient's last physical exam was 1 year ago .  Smoking: Life-long non-smoker ;  Physical Activity: Exercises at least 3 times per week ;  Alcohol/Drug Use: Is a non-drinker ; No illicit drug use ;  Patient is afflicted from Stress Incontinence and Urge Incontinence  Safety: reviewed ; Patient wears a seat belt, has smoke detectors, has carbon monoxide detectors, practices appropriate gun safety, and wears sunscreen with extended sun exposure. Dental Care: biannual cleanings, brushes and flosses daily. Ophthalmology/Optometry: Annual visit.  Hearing loss: none Vision impairments: glassess  Needs DEXA Needs mammogram  Fall Risk  10/21/2020  Falls in the past year? 0  Number falls in past yr: 0  Injury with Fall? 0  Risk for fall due to : Other (Comment)     Depression screen [facility] 2/9 10/21/2020 10/21/2020 05/26/2020  Decreased Interest 0 0 1  Down, Depressed, Hopeless 0 1 2  PHQ - 2 Score 0 1 3  Altered sleeping - - 0  Tired, decreased energy - - 2  Change in appetite - - 2  Feeling bad or failure about yourself  - - 2  Trouble concentrating - - 2  Moving slowly or fidgety/restless - - 0  Suicidal thoughts - - 0  PHQ-9 Score - - 11       Functional Status Survey: Is the patient deaf or have difficulty hearing?: No Does the patient have difficulty seeing, even when wearing glasses/contacts?: No Does the patient have difficulty concentrating, remembering, or making decisions?: No Does the patient have difficulty walking or climbing stairs?: Yes Does the patient have difficulty dressing or bathing?: No Does the patient have difficulty doing errands alone such as visiting a doctor's office or  shopping?: No   Social Hx   Social History   Socioeconomic History  . Marital status: Married    Spouse name: Not on file  . Number of children: Not on file  . Years of education: Not on file  . Highest education level: Not on file  Occupational History  . Occupation: Homemaker  Tobacco Use  . Smoking status: Never Smoker  . Smokeless tobacco: Never Used  Vaping Use  . Vaping Use: Never used  Substance and Sexual Activity  . Alcohol use: Never  . Drug use: Never  . Sexual activity: Not on file  Other Topics Concern  . Not on file  Social History Narrative  . Not on file   Social Determinants of Health   Financial Resource Strain: Low Risk   . Difficulty of Paying Living Expenses: Not hard at all  Food Insecurity: No Food Insecurity  . Worried About Charity fundraiser in the Last Year: Never true  . Ran Out of Food in the Last Year: Never true  Transportation Needs: No Transportation Needs  . Lack of Transportation (Medical): No  . Lack of Transportation (Non-Medical): No  Physical Activity: Sufficiently Active  . Days of Exercise per Week: 5 days  . Minutes of Exercise per Session: 30 min  Stress: No Stress Concern Present  . Feeling of Stress : Not at all  Social Connections: Moderately Isolated  . Frequency of Communication with Friends and Family: More than three times  a week  . Frequency of Social Gatherings with Friends and Family: Once a week  . Attends Religious Services: More than 4 times per year  . Active Member of Clubs or Organizations: No  . Attends Archivist Meetings: Never  . Marital Status: Widowed   Past Medical History:  Diagnosis Date  . Acute bronchitis due to other specified organisms 04/25/2020  . COPD (chronic obstructive pulmonary disease) ([place]) 05/26/2020  . COVID-19 10/30/2019  . Gastroesophageal reflux disease with esophagitis 05/26/2020  . Hypertension   . Major depressive disorder, single episode, mild ([place]) 05/26/2020  .  Mixed hyperlipidemia 05/26/2020  . Moderate protein-calorie malnutrition ([place]) 05/26/2020  . OSA (obstructive sleep apnea) 05/26/2020   History reviewed. No pertinent family history.  Review of Systems  Constitutional: Negative for activity change, appetite change and fever.  HENT: Positive for sinus pain. Negative for congestion and nosebleeds.   Eyes: Negative for visual disturbance.  Respiratory: Negative for apnea, cough, choking, chest tightness and wheezing.   Cardiovascular: Negative for chest pain, palpitations and leg swelling.  Gastrointestinal: Positive for abdominal distention, abdominal pain and nausea.  Endocrine: Negative for polyuria.  Genitourinary: Positive for difficulty urinating.       Stress incontinence  Musculoskeletal: Positive for arthralgias. Negative for back pain, gait problem and joint swelling.  Neurological: Positive for dizziness, weakness and numbness.  Psychiatric/Behavioral: Negative for agitation and behavioral problems.     Objective:  BP (!) 118/54 (BP Location: Right Arm, Patient Position: Sitting, Cuff Size: Normal)   Pulse 66   Temp (!) 97.5 F (36.4 C) (Temporal)   Resp 16   Ht 5\' 2"  (1.575 m)   Wt 114 lb 6.4 oz (51.9 kg)   SpO2 94%   BMI 20.92 kg/m   BP/Weight 10/21/2020 05/26/2020 9/62/2297  Systolic BP 989 211 941  Diastolic BP 54 78 56  Wt. (Lbs) 114.4 112 111  BMI 20.92 20.49 20.3    Physical Exam Cardiovascular:     Rate and Rhythm: Normal rate and regular rhythm.     Pulses: Normal pulses.     Heart sounds: Normal heart sounds.  Pulmonary:     Effort: Pulmonary effort is normal.     Breath sounds: Normal breath sounds.     Lab Results  Component Value Date   WBC 9.7 11/04/2019   HGB 10.9 (L) 11/04/2019   HCT 33.5 (L) 11/04/2019   PLT 466 (H) 11/04/2019   GLUCOSE 155 (H) 11/04/2019   ALT 45 (H) 11/04/2019   AST 30 11/04/2019   NA 137 11/04/2019   K 3.7 11/04/2019   CL 102 11/04/2019   CREATININE 0.81  11/04/2019   BUN 37 (H) 11/04/2019   CO2 25 11/04/2019      Assessment & Plan:  Diagnoses and all orders for this visit: Screening for osteoporosis -     DG Bone Density  Stress incontinence -     Ambulatory referral to Gynecology  Routine general medical examination at a health care facility AWV performed, referral to GYN for stress incontinence, she needs screening DXA  These are the goals we discussed:  Goals    . DIET - EAT MORE FRUITS AND VEGETABLES        This is a list of the screening recommended for you and due dates:  Health Maintenance  Topic Date Due  . Tetanus Vaccine  Never done  . DEXA scan (bone density measurement)  Never done  . Mammogram  02/18/2018  .  Flu Shot  Completed  . COVID-19 Vaccine  Completed  . Pneumonia vaccines  Completed     AN INDIVIDUALIZED CARE PLAN: was established or reinforced today.   SELF MANAGEMENT: The patient and I together assessed ways to personally work towards obtaining the recommended goals  Support needs The patient and/or family needs were assessed and services were offered and not necessary at this time.    Follow-up: Return if symptoms worsen or fail to improve.  Reinaldo Meeker, MD [facility] [phone]

## 2020-10-24 ENCOUNTER — Other Ambulatory Visit: Payer: Self-pay | Admitting: Legal Medicine

## 2020-10-24 DIAGNOSIS — J449 Chronic obstructive pulmonary disease, unspecified: Secondary | ICD-10-CM

## 2020-10-27 ENCOUNTER — Other Ambulatory Visit: Payer: Self-pay | Admitting: Legal Medicine

## 2020-10-31 DIAGNOSIS — J449 Chronic obstructive pulmonary disease, unspecified: Secondary | ICD-10-CM | POA: Diagnosis not present

## 2020-11-17 DIAGNOSIS — J449 Chronic obstructive pulmonary disease, unspecified: Secondary | ICD-10-CM | POA: Diagnosis not present

## 2020-11-24 DIAGNOSIS — R8290 Unspecified abnormal findings in urine: Secondary | ICD-10-CM | POA: Diagnosis not present

## 2020-11-25 DIAGNOSIS — N393 Stress incontinence (female) (male): Secondary | ICD-10-CM | POA: Insufficient documentation

## 2020-11-26 ENCOUNTER — Other Ambulatory Visit: Payer: Self-pay

## 2020-11-26 ENCOUNTER — Ambulatory Visit (INDEPENDENT_AMBULATORY_CARE_PROVIDER_SITE_OTHER): Payer: Medicare Other | Admitting: Legal Medicine

## 2020-11-26 ENCOUNTER — Encounter: Payer: Self-pay | Admitting: Legal Medicine

## 2020-11-26 VITALS — BP 100/70 | HR 63 | Temp 97.8°F | Resp 16 | Ht 60.0 in | Wt 115.0 lb

## 2020-11-26 DIAGNOSIS — E440 Moderate protein-calorie malnutrition: Secondary | ICD-10-CM

## 2020-11-26 DIAGNOSIS — G4733 Obstructive sleep apnea (adult) (pediatric): Secondary | ICD-10-CM

## 2020-11-26 DIAGNOSIS — K2100 Gastro-esophageal reflux disease with esophagitis, without bleeding: Secondary | ICD-10-CM | POA: Diagnosis not present

## 2020-11-26 DIAGNOSIS — F320 Major depressive disorder, single episode, mild: Secondary | ICD-10-CM

## 2020-11-26 DIAGNOSIS — J449 Chronic obstructive pulmonary disease, unspecified: Secondary | ICD-10-CM

## 2020-11-26 DIAGNOSIS — E782 Mixed hyperlipidemia: Secondary | ICD-10-CM | POA: Diagnosis not present

## 2020-11-26 DIAGNOSIS — D6489 Other specified anemias: Secondary | ICD-10-CM | POA: Diagnosis not present

## 2020-11-26 MED ORDER — OMEPRAZOLE 40 MG PO CPDR: 40.0000 mg | DELAYED_RELEASE_CAPSULE | Freq: Every day | ORAL | 2 refills | Status: DC

## 2020-11-26 NOTE — Progress Notes (Signed)
Subjective:  Patient ID: Brenda Patrick, female    DOB: 10-23-1932  Age: 87 y.o. MRN: [id number]  Chief Complaint  Patient presents with  . Depression  . Hyperlipidemia  . Gastroesophageal Reflux    HPI: chronic visit  This patient has major depression for years.  PHQ9 =4.  Patient is having less anhedonia.  The patient has improved future plans and prospects.  The depression is worse with stress.  The patient is not exercising and working on behavior to improve mental health.  Patient is not seeing a therapist or psychiatrist.  na  Patient is on celexa.  Patient presents with hyperlipidemia.  Compliance with treatment has been good; patient takes medicines as directed, maintains low cholesterol diet, follows up as directed, and maintains exercise regimen.  Patient is using simvastatin without problems.  Patient has gastroesophageal reflux symptoms withesophagitis and LTRD.  The symptoms are moderate intensity.  Length of symptoms 10 years.  Medicines include omeprole.  Complications include none.   Current Outpatient Medications on File Prior to Visit  Medication Sig Dispense Refill  . albuterol (PROVENTIL) (2.5 MG/3ML) 0.083% nebulizer solution USE 1 VIAL IN NEBULIZER 3 TIMES DAILY 75 mL 11  . atenolol (TENORMIN) 50 MG tablet Take 1 tablet (50 mg total) by mouth daily. 90 tablet 1  . busPIRone (BUSPAR) 10 MG tablet TAKE 1 TABLET BY MOUTH THREE TIMES DAILY 90 tablet 2  . citalopram (CELEXA) 40 MG tablet TAKE 1 TABLET BY MOUTH DAILY 90 tablet 2  . fluticasone (FLONASE) 50 MCG/ACT nasal spray Place 2 sprays into both nostrils daily.     Marland Kitchen LORazepam (ATIVAN) 1 MG tablet TAKE 1 TABLET BY MOUTH TWICE DAILY AS NEEDED 60 tablet 3  . losartan-hydrochlorothiazide (HYZAAR) 100-12.5 MG tablet TAKE 1 TABLET BY MOUTH DAILY 90 tablet 1  . Multiple Vitamins-Minerals (MULTIVITAMIN WOMEN 50+ PO) Take 1 tablet by mouth daily.    . simvastatin (ZOCOR) 20 MG tablet TAKE 1 TABLET BY MOUTH EVERY DAY 90  tablet 2  . zinc gluconate 50 MG tablet Take 50 mg by mouth daily.    Marland Kitchen PREMARIN vaginal cream SMARTSIG:1 Vaginal Every Night     No current facility-administered medications on file prior to visit.   Past Medical History:  Diagnosis Date  . Acute bronchitis due to other specified organisms 04/25/2020  . COPD (chronic obstructive pulmonary disease) ([place]) 05/26/2020  . COVID-19 10/30/2019  . Gastroesophageal reflux disease with esophagitis 05/26/2020  . Hypertension   . Major depressive disorder, single episode, mild ([place]) 05/26/2020  . Mixed hyperlipidemia 05/26/2020  . Moderate protein-calorie malnutrition ([place]) 05/26/2020  . OSA (obstructive sleep apnea) 05/26/2020   Past Surgical History:  Procedure Laterality Date  . ABDOMINAL HYSTERECTOMY    . DUODENAL DIVERTICULECTOMY  04/09/1993  . OVARIAN CYST REMOVAL      History reviewed. No pertinent family history. Social History   Socioeconomic History  . Marital status: Married    Spouse name: Not on file  . Number of children: 1  . Years of education: Not on file  . Highest education level: Not on file  Occupational History  . Occupation: Homemaker  Tobacco Use  . Smoking status: Never Smoker  . Smokeless tobacco: Never Used  Vaping Use  . Vaping Use: Never used  Substance and Sexual Activity  . Alcohol use: Never  . Drug use: Never  . Sexual activity: Not Currently  Other Topics Concern  . Not on file  Social History Narrative  .  Not on file   Social Determinants of Health   Financial Resource Strain: Low Risk   . Difficulty of Paying Living Expenses: Not hard at all  Food Insecurity: No Food Insecurity  . Worried About Charity fundraiser in the Last Year: Never true  . Ran Out of Food in the Last Year: Never true  Transportation Needs: No Transportation Needs  . Lack of Transportation (Medical): No  . Lack of Transportation (Non-Medical): No  Physical Activity: Sufficiently Active  . Days of Exercise per Week: 5  days  . Minutes of Exercise per Session: 30 min  Stress: No Stress Concern Present  . Feeling of Stress : Not at all  Social Connections: Moderately Isolated  . Frequency of Communication with Friends and Family: More than three times a week  . Frequency of Social Gatherings with Friends and Family: Once a week  . Attends Religious Services: More than 4 times per year  . Active Member of Clubs or Organizations: No  . Attends Archivist Meetings: Never  . Marital Status: Widowed    Review of Systems  Constitutional: Negative for activity change, fatigue and fever.  HENT: Negative for congestion and sinus pain.   Eyes: Negative for visual disturbance.  Respiratory: Positive for cough. Negative for chest tightness and shortness of breath.   Cardiovascular: Negative for chest pain, palpitations and leg swelling.  Gastrointestinal: Negative for abdominal distention and abdominal pain.  Genitourinary: Negative for difficulty urinating, dyspareunia and urgency.  Musculoskeletal: Negative for arthralgias and back pain.  Neurological: Negative.  Negative for headaches.  Psychiatric/Behavioral: Negative.      Objective:  BP 100/70   Pulse 63   Temp 97.8 F (36.6 C)   Resp 16   Ht 5' (1.524 m)   Wt 115 lb (52.2 kg)   SpO2 96%   BMI 22.46 kg/m   BP/Weight 11/26/2020 10/21/2020 07/13/5620  Systolic BP 308 657 846  Diastolic BP 70 54 78  Wt. (Lbs) 115 114.4 112  BMI 22.46 20.92 20.49    Physical Exam Vitals reviewed.  Constitutional:      General: She is not in acute distress.    Appearance: Normal appearance.  HENT:     Right Ear: Tympanic membrane, ear canal and external ear normal.     Left Ear: Tympanic membrane, ear canal and external ear normal.     Mouth/Throat:     Mouth: Mucous membranes are moist.     Pharynx: Oropharynx is clear.  Eyes:     Extraocular Movements: Extraocular movements intact.     Conjunctiva/sclera: Conjunctivae normal.     Pupils:  Pupils are equal, round, and reactive to light.  Cardiovascular:     Rate and Rhythm: Normal rate and regular rhythm.     Pulses: Normal pulses.     Heart sounds: Normal heart sounds. No murmur heard. No gallop.   Pulmonary:     Effort: No respiratory distress.     Breath sounds: No wheezing.  Abdominal:     General: Abdomen is flat. There is no distension.     Tenderness: There is no abdominal tenderness.  Musculoskeletal:        General: Normal range of motion.     Cervical back: Normal range of motion and neck supple.  Skin:    General: Skin is warm and dry.     Capillary Refill: Capillary refill takes less than 2 seconds.  Neurological:     General: No focal deficit  present.     Mental Status: She is alert and oriented to person, place, and time.  Psychiatric:        Mood and Affect: Mood normal.    Depression screen [facility] 2/9 11/26/2020 10/21/2020 10/21/2020 05/26/2020  Decreased Interest 1 0 0 1  Down, Depressed, Hopeless 1 0 1 2  PHQ - 2 Score 2 0 1 3  Altered sleeping 0 - - 0  Tired, decreased energy 1 - - 2  Change in appetite 0 - - 2  Feeling bad or failure about yourself  1 - - 2  Trouble concentrating - - - 2  Moving slowly or fidgety/restless 0 - - 0  Suicidal thoughts 0 - - 0  PHQ-9 Score 4 - - 11      Lab Results  Component Value Date   WBC 9.7 11/04/2019   HGB 10.9 (L) 11/04/2019   HCT 33.5 (L) 11/04/2019   PLT 466 (H) 11/04/2019   GLUCOSE 155 (H) 11/04/2019   ALT 45 (H) 11/04/2019   AST 30 11/04/2019   NA 137 11/04/2019   K 3.7 11/04/2019   CL 102 11/04/2019   CREATININE 0.81 11/04/2019   BUN 37 (H) 11/04/2019   CO2 25 11/04/2019      Assessment & Plan:   Diagnoses and all orders for this visit: Gastroesophageal reflux disease with esophagitis without hemorrhage -     omeprazole (PRILOSEC) 40 MG capsule; Take 1 capsule (40 mg total) by mouth daily. Plan of care was formulated today.  She is doing well.  A plan of care was formulated using  patient exam, tests and other sources to optimize care using evidence based information.  Recommend no smoking, no eating after supper, avoid fatty foods, elevate Head of bed, avoid tight fitting clothing.  Continue on omeprazole . Mixed hyperlipidemia -     Comprehensive metabolic panel -     Lipid panel AN INDIVIDUAL CARE PLAN for hyperlipidemia/ cholesterol was established and reinforced today.  The patient's status was assessed using clinical findings on exam, lab and other diagnostic tests. The patient's disease status was assessed based on evidence-based guidelines and found to be well controlled. MEDICATIONS were reviewed. SELF MANAGEMENT GOALS have been discussed and patient's success at attaining the goal of low cholesterol was assessed. RECOMMENDATION given include regular exercise 3 days a week and low cholesterol/low fat diet. CLINICAL SUMMARY including written plan to identify barriers unique to the patient due to social or economic  reasons was discussed.  Major depressive disorder, single episode, mild ([place]) Patient's depression is controlled with citalopram.   Anhedonia better.  PHQ 9 was performed score 4. An individual care plan was established or reinforced today.  The patient's disease status was assessed using clinical findings on exam, labs, and or other diagnostic testing to determine patient's success in meeting treatment goals based on disease specific evidence-based guidelines and found to be improving Recommendations include stay on medicine  OSA (obstructive sleep apnea) Patient has OSA but only on O2 at night, unable to tolerate CPAP  Chronic obstructive pulmonary disease, unspecified COPD type ([place]) An individualize plan was formulated for care of COPD.  Treatment is evidence based.  She will continue on inhalers, avoid smoking and smoke.  Regular exercise with help with dyspnea. Routine follow ups and medication compliance is needed.  Moderate protein-calorie  malnutrition ([place]) -     CBC with Differential/Platelet Supplement nutrition with protein/calorie supplement with meals to improve nutritional status.  Orders Placed This Encounter  Procedures  . Comprehensive metabolic panel  . Lipid panel  . CBC with Differential/Platelet      I spent 9minutes dedicated to the care of this patient on the date of this encounter to include face-to-face time with the patient, as well as:   Follow-up: No follow-ups on file.  An After Visit Summary was printed and given to the patient.  Reinaldo Meeker, MD [facility] [phone]

## 2020-11-27 LAB — CBC WITH DIFFERENTIAL/PLATELET
Basophils Absolute: 0.1 10*3/uL (ref 0.0–0.2)
Basos: 1 %
EOS (ABSOLUTE): 0.1 10*3/uL (ref 0.0–0.4)
Eos: 1 %
Hematocrit: 36.5 % (ref 34.0–46.6)
Hemoglobin: 12.1 g/dL (ref 11.1–15.9)
Immature Grans (Abs): 0 10*3/uL (ref 0.0–0.1)
Immature Granulocytes: 0 %
Lymphocytes Absolute: 2.4 10*3/uL (ref 0.7–3.1)
Lymphs: 34 %
MCH: 32.1 pg (ref 26.6–33.0)
MCHC: 33.2 g/dL (ref 31.5–35.7)
MCV: 97 fL (ref 79–97)
Monocytes Absolute: 0.4 10*3/uL (ref 0.1–0.9)
Monocytes: 6 %
Neutrophils Absolute: 4 10*3/uL (ref 1.4–7.0)
Neutrophils: 58 %
Platelets: 264 10*3/uL (ref 150–450)
RBC: 3.77 x10E6/uL (ref 3.77–5.28)
RDW: 11.9 % (ref 11.7–15.4)
WBC: 7 10*3/uL (ref 3.4–10.8)

## 2020-11-27 LAB — COMPREHENSIVE METABOLIC PANEL
ALT: 14 IU/L (ref 0–32)
AST: 19 IU/L (ref 0–40)
Albumin/Globulin Ratio: 2 (ref 1.2–2.2)
Albumin: 4.2 g/dL (ref 3.6–4.6)
Alkaline Phosphatase: 71 IU/L (ref 44–121)
BUN/Creatinine Ratio: 31 — ABNORMAL HIGH (ref 12–28)
BUN: 24 mg/dL (ref 8–27)
Bilirubin Total: 0.6 mg/dL (ref 0.0–1.2)
CO2: 28 mmol/L (ref 20–29)
Calcium: 9.3 mg/dL (ref 8.7–10.3)
Chloride: 102 mmol/L (ref 96–106)
Creatinine, Ser: 0.78 mg/dL (ref 0.57–1.00)
GFR calc Af Amer: 80 mL/min/{1.73_m2} (ref >59)
GFR calc non Af Amer: 69 mL/min/{1.73_m2} (ref >59)
Globulin, Total: 2.1 g/dL (ref 1.5–4.5)
Glucose: 89 mg/dL (ref 65–99)
Potassium: 4.4 mmol/L (ref 3.5–5.2)
Sodium: 141 mmol/L (ref 134–144)
Total Protein: 6.3 g/dL (ref 6.0–8.5)

## 2020-11-27 LAB — LIPID PANEL
Chol/HDL Ratio: 2.9 ratio (ref 0.0–4.4)
Cholesterol, Total: 180 mg/dL (ref 100–199)
HDL: 62 mg/dL (ref >39)
LDL Chol Calc (NIH): 107 mg/dL — ABNORMAL HIGH (ref 0–99)
Triglycerides: 54 mg/dL (ref 0–149)
VLDL Cholesterol Cal: 11 mg/dL (ref 5–40)

## 2020-11-27 LAB — CARDIOVASCULAR RISK ASSESSMENT

## 2020-11-27 NOTE — Progress Notes (Signed)
Kidney and liver tests normal, LDL c 107, continued anemia slightly worse- add ferritin, b12 and folate for anemia,  lp

## 2020-12-02 LAB — B12 AND FOLATE PANEL
Folate: >20 ng/mL (ref >3.0)
Vitamin B-12: 456 pg/mL (ref 232–1245)

## 2020-12-02 LAB — SPECIMEN STATUS REPORT

## 2020-12-02 LAB — FERRITIN: Ferritin: 20 ng/mL (ref 15–150)

## 2020-12-02 NOTE — Progress Notes (Signed)
B12 and folate levels normal, iron levels low 20.  Take iron daily, may need iron infusion lp

## 2020-12-09 ENCOUNTER — Other Ambulatory Visit: Payer: Self-pay

## 2020-12-09 DIAGNOSIS — F320 Major depressive disorder, single episode, mild: Secondary | ICD-10-CM

## 2020-12-09 MED ORDER — BUSPIRONE HCL 10 MG PO TABS: 10.0000 mg | ORAL_TABLET | Freq: Three times a day (TID) | ORAL | 2 refills | Status: DC

## 2020-12-15 ENCOUNTER — Other Ambulatory Visit: Payer: Self-pay | Admitting: Legal Medicine

## 2020-12-16 DIAGNOSIS — J449 Chronic obstructive pulmonary disease, unspecified: Secondary | ICD-10-CM | POA: Diagnosis not present

## 2020-12-18 DIAGNOSIS — J449 Chronic obstructive pulmonary disease, unspecified: Secondary | ICD-10-CM | POA: Diagnosis not present

## 2021-01-14 ENCOUNTER — Other Ambulatory Visit: Payer: Self-pay | Admitting: Legal Medicine

## 2021-01-14 DIAGNOSIS — J449 Chronic obstructive pulmonary disease, unspecified: Secondary | ICD-10-CM

## 2021-01-15 DIAGNOSIS — J449 Chronic obstructive pulmonary disease, unspecified: Secondary | ICD-10-CM | POA: Diagnosis not present

## 2021-01-16 ENCOUNTER — Other Ambulatory Visit: Payer: Self-pay | Admitting: Legal Medicine

## 2021-01-19 DIAGNOSIS — J410 Simple chronic bronchitis: Secondary | ICD-10-CM | POA: Diagnosis not present

## 2021-02-13 DIAGNOSIS — J410 Simple chronic bronchitis: Secondary | ICD-10-CM | POA: Diagnosis not present

## 2021-02-15 DIAGNOSIS — J449 Chronic obstructive pulmonary disease, unspecified: Secondary | ICD-10-CM | POA: Diagnosis not present

## 2021-03-03 ENCOUNTER — Other Ambulatory Visit: Payer: Self-pay

## 2021-03-03 DIAGNOSIS — K2100 Gastro-esophageal reflux disease with esophagitis, without bleeding: Secondary | ICD-10-CM

## 2021-03-03 MED ORDER — OMEPRAZOLE 40 MG PO CPDR: 40.0000 mg | DELAYED_RELEASE_CAPSULE | Freq: Every day | ORAL | 2 refills | Status: DC

## 2021-03-06 ENCOUNTER — Other Ambulatory Visit: Payer: Self-pay | Admitting: Legal Medicine

## 2021-03-09 DIAGNOSIS — J410 Simple chronic bronchitis: Secondary | ICD-10-CM | POA: Diagnosis not present

## 2021-03-17 DIAGNOSIS — J449 Chronic obstructive pulmonary disease, unspecified: Secondary | ICD-10-CM | POA: Diagnosis not present

## 2021-03-31 DIAGNOSIS — J410 Simple chronic bronchitis: Secondary | ICD-10-CM | POA: Diagnosis not present

## 2021-04-02 ENCOUNTER — Other Ambulatory Visit: Payer: Self-pay | Admitting: Legal Medicine

## 2021-04-02 DIAGNOSIS — F320 Major depressive disorder, single episode, mild: Secondary | ICD-10-CM

## 2021-04-17 DIAGNOSIS — J449 Chronic obstructive pulmonary disease, unspecified: Secondary | ICD-10-CM | POA: Diagnosis not present

## 2021-04-20 DIAGNOSIS — J410 Simple chronic bronchitis: Secondary | ICD-10-CM | POA: Diagnosis not present

## 2021-05-07 ENCOUNTER — Other Ambulatory Visit: Payer: Self-pay

## 2021-05-07 DIAGNOSIS — J449 Chronic obstructive pulmonary disease, unspecified: Secondary | ICD-10-CM

## 2021-05-07 MED ORDER — SIMVASTATIN 20 MG PO TABS: 20.0000 mg | ORAL_TABLET | Freq: Every day | ORAL | 2 refills | Status: DC

## 2021-05-07 MED ORDER — ALBUTEROL SULFATE (2.5 MG/3ML) 0.083% IN NEBU: 2.5000 mg | INHALATION_SOLUTION | Freq: Four times a day (QID) | RESPIRATORY_TRACT | 11 refills | Status: DC | PRN

## 2021-05-07 NOTE — Telephone Encounter (Signed)
Needs short supply sent to local pharmacy.   Brenda Patrick, Brenda Patrick 05/07/21 2:05 PM

## 2021-05-17 DIAGNOSIS — J449 Chronic obstructive pulmonary disease, unspecified: Secondary | ICD-10-CM | POA: Diagnosis not present

## 2021-05-20 DIAGNOSIS — J410 Simple chronic bronchitis: Secondary | ICD-10-CM | POA: Diagnosis not present

## 2021-05-21 ENCOUNTER — Other Ambulatory Visit: Payer: Self-pay

## 2021-05-21 DIAGNOSIS — J449 Chronic obstructive pulmonary disease, unspecified: Secondary | ICD-10-CM

## 2021-05-21 MED ORDER — ALBUTEROL SULFATE (2.5 MG/3ML) 0.083% IN NEBU: 2.5000 mg | INHALATION_SOLUTION | Freq: Four times a day (QID) | RESPIRATORY_TRACT | 11 refills | Status: DC | PRN

## 2021-05-26 ENCOUNTER — Ambulatory Visit (INDEPENDENT_AMBULATORY_CARE_PROVIDER_SITE_OTHER): Payer: Medicare Other | Admitting: Legal Medicine

## 2021-05-26 ENCOUNTER — Encounter: Payer: Self-pay | Admitting: Legal Medicine

## 2021-05-26 ENCOUNTER — Other Ambulatory Visit: Payer: Self-pay

## 2021-05-26 VITALS — BP 110/70 | HR 61 | Temp 98.1°F | Resp 16 | Ht 60.0 in | Wt 116.0 lb

## 2021-05-26 DIAGNOSIS — I700 Atherosclerosis of aorta: Secondary | ICD-10-CM

## 2021-05-26 DIAGNOSIS — E440 Moderate protein-calorie malnutrition: Secondary | ICD-10-CM | POA: Diagnosis not present

## 2021-05-26 DIAGNOSIS — K2100 Gastro-esophageal reflux disease with esophagitis, without bleeding: Secondary | ICD-10-CM | POA: Diagnosis not present

## 2021-05-26 DIAGNOSIS — J449 Chronic obstructive pulmonary disease, unspecified: Secondary | ICD-10-CM | POA: Diagnosis not present

## 2021-05-26 DIAGNOSIS — E782 Mixed hyperlipidemia: Secondary | ICD-10-CM | POA: Diagnosis not present

## 2021-05-26 DIAGNOSIS — F320 Major depressive disorder, single episode, mild: Secondary | ICD-10-CM | POA: Diagnosis not present

## 2021-05-26 DIAGNOSIS — N393 Stress incontinence (female) (male): Secondary | ICD-10-CM

## 2021-05-26 MED ORDER — VENLAFAXINE HCL ER 37.5 MG PO CP24: 37.5000 mg | ORAL_CAPSULE | Freq: Every day | ORAL | 3 refills | Status: DC

## 2021-05-26 NOTE — Progress Notes (Signed)
Established Patient Office Visit  Subjective:  Patient ID: Brenda Patrick, female    DOB: [birth year]  Age: 87 y.o. MRN: [id number]  CC:  Chief Complaint  Patient presents with   Depression   Hyperlipidemia   Gastroesophageal Reflux    HPI Brenda Patrick presents for grief for 2 months, anhedonia, worse at night.  She has given up on life with death of husband.  Past Medical History:  Diagnosis Date   Acute bronchitis due to other specified organisms 04/25/2020   COPD (chronic obstructive pulmonary disease) ([place]) 05/26/2020   COVID-19 10/30/2019   Gastroesophageal reflux disease with esophagitis 05/26/2020   Hypertension    Major depressive disorder, single episode, mild ([place]) 05/26/2020   Mixed hyperlipidemia 05/26/2020   Moderate protein-calorie malnutrition ([place]) 05/26/2020   OSA (obstructive sleep apnea) 05/26/2020    Past Surgical History:  Procedure Laterality Date   ABDOMINAL HYSTERECTOMY     DUODENAL DIVERTICULECTOMY  04/09/1993   OVARIAN CYST REMOVAL      History reviewed. No pertinent family history.  Social History   Socioeconomic History   Marital status: Married    Spouse name: Not on file   Number of children: 1   Years of education: Not on file   Highest education level: Not on file  Occupational History   Occupation: Homemaker  Tobacco Use   Smoking status: Never   Smokeless tobacco: Never  Vaping Use   Vaping Use: Never used  Substance and Sexual Activity   Alcohol use: Never   Drug use: Never   Sexual activity: Not Currently  Other Topics Concern   Not on file  Social History Narrative   Not on file   Social Determinants of Health   Financial Resource Strain: Low Risk    Difficulty of Paying Living Expenses: Not hard at all  Food Insecurity: No Food Insecurity   Worried About Charity fundraiser in the Last Year: Never true   [place] in the Last Year: Never true  Transportation Needs: No Transportation Needs   Lack of  Transportation (Medical): No   Lack of Transportation (Non-Medical): No  Physical Activity: Sufficiently Active   Days of Exercise per Week: 5 days   Minutes of Exercise per Session: 30 min  Stress: No Stress Concern Present   Feeling of Stress : Not at all  Social Connections: Moderately Isolated   Frequency of Communication with Friends and Family: More than three times a week   Frequency of Social Gatherings with Friends and Family: Once a week   Attends Religious Services: More than 4 times per year   Active Member of [employer] or Organizations: No   Attends Archivist Meetings: Never   Marital Status: Widowed  Human resources officer Violence: Not At Risk   Fear of Current or Ex-Partner: No   Emotionally Abused: No   Physically Abused: No   Sexually Abused: No    Outpatient Medications Prior to Visit  Medication Sig Dispense Refill   albuterol (PROVENTIL) (2.5 MG/3ML) 0.083% nebulizer solution Take 3 mLs (2.5 mg total) by nebulization every 6 (six) hours as needed for wheezing or shortness of breath. 90 mL 11   atenolol (TENORMIN) 50 MG tablet TAKE 1 TABLET(50 MG) BY MOUTH DAILY 90 tablet 2   busPIRone (BUSPAR) 10 MG tablet TAKE 1 TABLET(10 MG) BY MOUTH THREE TIMES DAILY 90 tablet 2   ferrous sulfate 325 (65 FE) MG tablet Take by mouth.  fluticasone (FLONASE) 50 MCG/ACT nasal spray SHAKE LIQUID AND USE 1 SPRAY IN EACH NOSTRIL AT BEDTIME 16 g 6   LORazepam (ATIVAN) 1 MG tablet TAKE 1 TABLET BY MOUTH TWICE DAILY AS NEEDED 60 tablet 3   losartan-hydrochlorothiazide (HYZAAR) 100-12.5 MG tablet TAKE 1 TABLET BY MOUTH DAILY 90 tablet 1   Multiple Vitamins-Minerals (MULTIVITAMIN WOMEN 50+ PO) Take 1 tablet by mouth daily.     omeprazole (PRILOSEC) 40 MG capsule Take 1 capsule (40 mg total) by mouth daily. 90 capsule 2   PREMARIN vaginal cream SMARTSIG:1 Vaginal Every Night     simvastatin (ZOCOR) 20 MG tablet Take 1 tablet (20 mg total) by mouth daily. 90 tablet 2   zinc gluconate  50 MG tablet Take 50 mg by mouth daily.     citalopram (CELEXA) 40 MG tablet TAKE 1 TABLET BY MOUTH DAILY 90 tablet 2   No facility-administered medications prior to visit.    Allergies  Allergen Reactions   Ceftin [Cefuroxime] Diarrhea   Lisinopril Diarrhea    ROS Review of Systems  Constitutional:  Negative for activity change and appetite change.  HENT:  Negative for congestion.   Eyes:  Negative for visual disturbance.  Respiratory:  Negative for chest tightness and shortness of breath.   Cardiovascular: Negative.   Gastrointestinal:  Negative for abdominal distention and abdominal pain.  Endocrine: Negative for polyuria.  Genitourinary:  Negative for difficulty urinating and dysuria.  Musculoskeletal:  Negative for arthralgias and back pain.  Skin: Negative.   Neurological: Negative.   Psychiatric/Behavioral:  Positive for dysphoric mood.      Objective:    Physical Exam Vitals reviewed.  Constitutional:      Appearance: Normal appearance.  HENT:     Head: Normocephalic.     Right Ear: Tympanic membrane normal.     Left Ear: Tympanic membrane normal.     Mouth/Throat:     Mouth: Mucous membranes are moist.     Pharynx: Oropharynx is clear.  Eyes:     Extraocular Movements: Extraocular movements intact.     Conjunctiva/sclera: Conjunctivae normal.     Pupils: Pupils are equal, round, and reactive to light.  Cardiovascular:     Rate and Rhythm: Normal rate and regular rhythm.     Pulses: Normal pulses.     Heart sounds: Normal heart sounds. No murmur heard.   No gallop.  Pulmonary:     Effort: Pulmonary effort is normal. No respiratory distress.     Breath sounds: No wheezing.  Abdominal:     General: Abdomen is flat. Bowel sounds are normal. There is no distension.     Palpations: Abdomen is soft.     Tenderness: There is no abdominal tenderness.  Musculoskeletal:        General: Normal range of motion.     Cervical back: Normal range of motion and neck  supple.  Skin:    General: Skin is warm.     Capillary Refill: Capillary refill takes less than 2 seconds.  Neurological:     General: No focal deficit present.     Mental Status: She is alert and oriented to person, place, and time.    BP 110/70   Pulse 61   Temp 98.1 F (36.7 C)   Resp 16   Ht 5' (1.524 m)   Wt 116 lb (52.6 kg)   SpO2 95%   BMI 22.65 kg/m  Wt Readings from Last 3 Encounters:  05/26/21 116 lb (52.6 kg)  11/26/20 115 lb (52.2 kg)  10/21/20 114 lb 6.4 oz (51.9 kg)   Depression screen [facility] 2/9 05/26/2021 11/26/2020 10/21/2020 10/21/2020 05/26/2020  Decreased Interest 1 1 0 0 1  Down, Depressed, Hopeless 2 1 0 1 2  PHQ - 2 Score 3 2 0 1 3  Altered sleeping 0 0 - - 0  Tired, decreased energy 2 1 - - 2  Change in appetite 1 0 - - 2  Feeling bad or failure about yourself  2 1 - - 2  Trouble concentrating 1 - - - 2  Moving slowly or fidgety/restless 1 0 - - 0  Suicidal thoughts 1 0 - - 0  PHQ-9 Score 11 4 - - 11  Difficult doing work/chores Somewhat difficult - - - -     Health Maintenance Due  Topic Date Due   TETANUS/TDAP  Never done   Zoster Vaccines- Shingrix (1 of 2) Never done   DEXA SCAN  Never done   COVID-19 Vaccine (3 - Booster for Pfizer series) 05/20/2020    There are no preventive care reminders to display for this patient.  No results found for: TSH Lab Results  Component Value Date   WBC 7.0 11/26/2020   HGB 12.1 11/26/2020   HCT 36.5 11/26/2020   MCV 97 11/26/2020   PLT 264 11/26/2020   Lab Results  Component Value Date   NA 141 11/26/2020   K 4.4 11/26/2020   CO2 28 11/26/2020   GLUCOSE 89 11/26/2020   BUN 24 11/26/2020   CREATININE 0.78 11/26/2020   BILITOT 0.6 11/26/2020   ALKPHOS 71 11/26/2020   AST 19 11/26/2020   ALT 14 11/26/2020   PROT 6.3 11/26/2020   ALBUMIN 4.2 11/26/2020   CALCIUM 9.3 11/26/2020   ANIONGAP 10 11/04/2019   Lab Results  Component Value Date   CHOL 180 11/26/2020   Lab Results  Component  Value Date   HDL 62 11/26/2020   Lab Results  Component Value Date   LDLCALC 107 (H) 11/26/2020   Lab Results  Component Value Date   TRIG 54 11/26/2020   Lab Results  Component Value Date   CHOLHDL 2.9 11/26/2020   No results found for: HGBA1C    Assessment & Plan:   Problem List Items Addressed This Visit       Respiratory   COPD (chronic obstructive pulmonary disease) ([place]) An individualize plan was formulated for care of COPD.  Treatment is evidence based.  She will continue on inhalers, avoid smoking and smoke.  Regular exercise with help with dyspnea. Routine follow ups and medication compliance is needed.      Digestive   Gastroesophageal reflux disease with esophagitis Plan of care was formulated today.  She is doing well.  A plan of care was formulated using patient exam, tests and other sources to optimize care using evidence based information.  Recommend no smoking, no eating after supper, avoid fatty foods, elevate Head of bed, avoid tight fitting clothing.  Continue on omeprazole .      Other   Mixed hyperlipidemia - Primary   Relevant Orders   Comprehensive metabolic panel   Lipid panel   CBC with Differential/Platelet   TSH AN INDIVIDUAL CARE PLAN for hyperlipidemia/ cholesterol was established and reinforced today.  The patient's status was assessed using clinical findings on exam, lab and other diagnostic tests. The patient's disease status was assessed based on evidence-based guidelines and found to be fair controlled. MEDICATIONS were reviewed. SELF  MANAGEMENT GOALS have been discussed and patient's success at attaining the goal of low cholesterol was assessed. RECOMMENDATION given include regular exercise 3 days a week and low cholesterol/low fat diet. CLINICAL SUMMARY including written plan to identify barriers unique to the patient due to social or economic  reasons was discussed.    Major depressive disorder, single episode, mild ([place]) Patient's  depression is uncontrolled with citalopram.   Anhedonia worse.  PHQ 9 was performed score 11. An individual care plan was established or reinforced today.  The patient's disease status was assessed using clinical findings on exam, labs, and or other diagnostic testing to determine patient's success in meeting treatment goals based on disease specific evidence-based guidelines and found to be worsening Recommendations include change to effexor     Moderate protein-calorie malnutrition ([place]) Supplement nutrition with protein/calorie supplement with meals to improve nutritional status.    Stress incontinence AN INDIVIDUAL CARE PLAN stress incontinence was established and reinforced today.  The patient's status was assessed using clinical findings on exam, labs, and other diagnostic testing. Patient's success at meeting treatment goals based on disease specific evidence-bassed guidelines and found to be in good control. RECOMMENDATIONS include maintaining present medicines and treatment.    Other Visit Diagnoses     Atherosclerosis of aorta [facility])     Patient has atherosclerosis on aorta on Ct scan     30 plus minutes of counseling and review of records    Follow-up: Return in about 3 weeks (around 06/16/2021) for depression.    Reinaldo Meeker, MD

## 2021-05-27 ENCOUNTER — Other Ambulatory Visit: Payer: Self-pay | Admitting: Legal Medicine

## 2021-05-27 DIAGNOSIS — E782 Mixed hyperlipidemia: Secondary | ICD-10-CM

## 2021-05-27 LAB — LIPID PANEL
Chol/HDL Ratio: 3 ratio (ref 0.0–4.4)
Cholesterol, Total: 207 mg/dL — ABNORMAL HIGH (ref 100–199)
HDL: 68 mg/dL (ref >39)
LDL Chol Calc (NIH): 125 mg/dL — ABNORMAL HIGH (ref 0–99)
Triglycerides: 80 mg/dL (ref 0–149)
VLDL Cholesterol Cal: 14 mg/dL (ref 5–40)

## 2021-05-27 LAB — COMPREHENSIVE METABOLIC PANEL
ALT: 15 IU/L (ref 0–32)
AST: 18 IU/L (ref 0–40)
Albumin/Globulin Ratio: 2.1 (ref 1.2–2.2)
Albumin: 4.6 g/dL (ref 3.6–4.6)
Alkaline Phosphatase: 66 IU/L (ref 44–121)
BUN/Creatinine Ratio: 19 (ref 12–28)
BUN: 18 mg/dL (ref 8–27)
Bilirubin Total: 0.7 mg/dL (ref 0.0–1.2)
CO2: 26 mmol/L (ref 20–29)
Calcium: 10 mg/dL (ref 8.7–10.3)
Chloride: 99 mmol/L (ref 96–106)
Creatinine, Ser: 0.94 mg/dL (ref 0.57–1.00)
Globulin, Total: 2.2 g/dL (ref 1.5–4.5)
Glucose: 95 mg/dL (ref 65–99)
Potassium: 4.4 mmol/L (ref 3.5–5.2)
Sodium: 139 mmol/L (ref 134–144)
Total Protein: 6.8 g/dL (ref 6.0–8.5)
eGFR: 59 mL/min/{1.73_m2} — ABNORMAL LOW (ref >59)

## 2021-05-27 LAB — TSH: TSH: 3.26 u[IU]/mL (ref 0.450–4.500)

## 2021-05-27 LAB — CBC WITH DIFFERENTIAL/PLATELET
Basophils Absolute: 0.1 10*3/uL (ref 0.0–0.2)
Basos: 1 %
EOS (ABSOLUTE): 0.2 10*3/uL (ref 0.0–0.4)
Eos: 3 %
Hematocrit: 39.3 % (ref 34.0–46.6)
Hemoglobin: 13.1 g/dL (ref 11.1–15.9)
Immature Grans (Abs): 0 10*3/uL (ref 0.0–0.1)
Immature Granulocytes: 0 %
Lymphocytes Absolute: 2.7 10*3/uL (ref 0.7–3.1)
Lymphs: 38 %
MCH: 33 pg (ref 26.6–33.0)
MCHC: 33.3 g/dL (ref 31.5–35.7)
MCV: 99 fL — ABNORMAL HIGH (ref 79–97)
Monocytes Absolute: 0.5 10*3/uL (ref 0.1–0.9)
Monocytes: 7 %
Neutrophils Absolute: 3.5 10*3/uL (ref 1.4–7.0)
Neutrophils: 51 %
Platelets: 260 10*3/uL (ref 150–450)
RBC: 3.97 x10E6/uL (ref 3.77–5.28)
RDW: 11.8 % (ref 11.7–15.4)
WBC: 6.9 10*3/uL (ref 3.4–10.8)

## 2021-05-27 LAB — CARDIOVASCULAR RISK ASSESSMENT

## 2021-05-27 MED ORDER — PRAVASTATIN SODIUM 40 MG PO TABS: 40.0000 mg | ORAL_TABLET | Freq: Every day | ORAL | 3 refills | Status: DC

## 2021-05-27 NOTE — Progress Notes (Signed)
Kidney tests stage 3a, liver tests norma, potassium 4.4 normal, LDL cholesterol 125 high consider pravastatin, cbc normal, TSH 3.2 normal,  lp

## 2021-06-10 ENCOUNTER — Other Ambulatory Visit: Payer: Self-pay | Admitting: Legal Medicine

## 2021-06-11 DIAGNOSIS — N362 Urethral caruncle: Secondary | ICD-10-CM | POA: Diagnosis not present

## 2021-06-16 ENCOUNTER — Encounter: Payer: Self-pay | Admitting: Legal Medicine

## 2021-06-16 ENCOUNTER — Ambulatory Visit (INDEPENDENT_AMBULATORY_CARE_PROVIDER_SITE_OTHER): Payer: Medicare Other | Admitting: Legal Medicine

## 2021-06-16 ENCOUNTER — Other Ambulatory Visit: Payer: Self-pay

## 2021-06-16 VITALS — BP 140/60 | HR 65 | Temp 97.6°F | Resp 16 | Ht 60.0 in | Wt 115.0 lb

## 2021-06-16 DIAGNOSIS — E782 Mixed hyperlipidemia: Secondary | ICD-10-CM

## 2021-06-16 DIAGNOSIS — F320 Major depressive disorder, single episode, mild: Secondary | ICD-10-CM

## 2021-06-16 NOTE — Progress Notes (Signed)
Established Patient Office Visit  Subjective:  Patient ID: Brenda Patrick, female    DOB: August 20, 1932  Age: 87 y.o. MRN: [id number]  CC:  Chief Complaint  Patient presents with   Depression    HPI [name] presents for chronic visit  Patient doing well on venlafaxine.   PHQ 5  Patient presents with hyperlipidemia.  Compliance with treatment has been good; patient takes medicines as directed, maintains low cholesterol diet, follows up as directed, and maintains exercise regimen.  Patient is using pravastatin without problems.   Past Medical History:  Diagnosis Date   Acute bronchitis due to other specified organisms 04/25/2020   COPD (chronic obstructive pulmonary disease) ([place]) 05/26/2020   COVID-19 10/30/2019   Gastroesophageal reflux disease with esophagitis 05/26/2020   Hypertension    Major depressive disorder, single episode, mild ([place]) 05/26/2020   Mixed hyperlipidemia 05/26/2020   Moderate protein-calorie malnutrition ([place]) 05/26/2020   OSA (obstructive sleep apnea) 05/26/2020    Past Surgical History:  Procedure Laterality Date   ABDOMINAL HYSTERECTOMY     DUODENAL DIVERTICULECTOMY  04/09/1993   OVARIAN CYST REMOVAL      History reviewed. No pertinent family history.  Social History   Socioeconomic History   Marital status: Married    Spouse name: Not on file   Number of children: 1   Years of education: Not on file   Highest education level: Not on file  Occupational History   Occupation: Homemaker  Tobacco Use   Smoking status: Never   Smokeless tobacco: Never  Vaping Use   Vaping Use: Never used  Substance and Sexual Activity   Alcohol use: Never   Drug use: Never   Sexual activity: Not Currently  Other Topics Concern   Not on file  Social History Narrative   Not on file   Social Determinants of Health   Financial Resource Strain: Low Risk    Difficulty of Paying Living Expenses: Not hard at all  Food Insecurity: No Food  Insecurity   Worried About Charity fundraiser in the Last Year: Never true   [place] in the Last Year: Never true  Transportation Needs: No Transportation Needs   Lack of Transportation (Medical): No   Lack of Transportation (Non-Medical): No  Physical Activity: Sufficiently Active   Days of Exercise per Week: 5 days   Minutes of Exercise per Session: 30 min  Stress: No Stress Concern Present   Feeling of Stress : Not at all  Social Connections: Moderately Isolated   Frequency of Communication with Friends and Family: More than three times a week   Frequency of Social Gatherings with Friends and Family: Once a week   Attends Religious Services: More than 4 times per year   Active Member of [employer] or Organizations: No   Attends Archivist Meetings: Never   Marital Status: Widowed  Human resources officer Violence: Not At Risk   Fear of Current or Ex-Partner: No   Emotionally Abused: No   Physically Abused: No   Sexually Abused: No    Outpatient Medications Prior to Visit  Medication Sig Dispense Refill   albuterol (PROVENTIL) (2.5 MG/3ML) 0.083% nebulizer solution Take 3 mLs (2.5 mg total) by nebulization every 6 (six) hours as needed for wheezing or shortness of breath. 90 mL 11   atenolol (TENORMIN) 50 MG tablet TAKE 1 TABLET(50 MG) BY MOUTH DAILY 90 tablet 2   ferrous sulfate 325 (65 FE) MG tablet Take by mouth.  fluticasone (FLONASE) 50 MCG/ACT nasal spray SHAKE LIQUID AND USE 1 SPRAY IN EACH NOSTRIL AT BEDTIME 16 g 6   LORazepam (ATIVAN) 1 MG tablet TAKE 1 TABLET BY MOUTH TWICE DAILY AS NEEDED (Patient taking differently: Take 1 mg by mouth at bedtime.) 60 tablet 3   losartan-hydrochlorothiazide (HYZAAR) 100-12.5 MG tablet TAKE 1 TABLET BY MOUTH DAILY 90 tablet 1   Multiple Vitamins-Minerals (MULTIVITAMIN WOMEN 50+ PO) Take 1 tablet by mouth daily.     omeprazole (PRILOSEC) 40 MG capsule Take 1 capsule (40 mg total) by mouth daily. 90 capsule 2   pravastatin  (PRAVACHOL) 40 MG tablet Take 40 mg by mouth daily as needed.     PREMARIN vaginal cream SMARTSIG:1 Vaginal Every Night     venlafaxine XR (EFFEXOR XR) 37.5 MG 24 hr capsule Take 1 capsule (37.5 mg total) by mouth daily with breakfast. 30 capsule 3   zinc gluconate 50 MG tablet Take 50 mg by mouth daily.     LORazepam (ATIVAN) 1 MG tablet Take by mouth.     busPIRone (BUSPAR) 10 MG tablet TAKE 1 TABLET(10 MG) BY MOUTH THREE TIMES DAILY 90 tablet 2   simvastatin (ZOCOR) 20 MG tablet Take 1 tablet (20 mg total) by mouth daily. 90 tablet 2   No facility-administered medications prior to visit.    Allergies  Allergen Reactions   Ceftin [Cefuroxime] Diarrhea   Lisinopril Diarrhea    ROS Review of Systems  Constitutional:  Negative for activity change and appetite change.  HENT:  Negative for congestion.   Eyes:  Negative for visual disturbance.  Respiratory:  Negative for chest tightness and shortness of breath.   Cardiovascular:  Negative for chest pain and palpitations.  Gastrointestinal:  Negative for abdominal distention and abdominal pain.  Endocrine: Positive for polyuria.  Genitourinary:  Negative for difficulty urinating and dysuria.  Musculoskeletal:  Negative for arthralgias and back pain.  Skin: Negative.   Neurological: Negative.   Psychiatric/Behavioral: Negative.       Objective:    Physical Exam Vitals reviewed.  Constitutional:      General: She is in acute distress.     Appearance: Normal appearance.  HENT:     Head: Normocephalic.     Right Ear: Tympanic membrane, ear canal and external ear normal.     Left Ear: Tympanic membrane, ear canal and external ear normal.     Mouth/Throat:     Mouth: Mucous membranes are moist.     Pharynx: Oropharynx is clear.  Eyes:     Extraocular Movements: Extraocular movements intact.     Conjunctiva/sclera: Conjunctivae normal.     Pupils: Pupils are equal, round, and reactive to light.  Cardiovascular:     Rate and  Rhythm: Normal rate and regular rhythm.     Pulses: Normal pulses.     Heart sounds: Normal heart sounds. No murmur heard.   No gallop.  Pulmonary:     Effort: Pulmonary effort is normal. No respiratory distress.     Breath sounds: Normal breath sounds. No wheezing.  Abdominal:     General: Abdomen is flat. Bowel sounds are normal. There is no distension.     Palpations: Abdomen is soft.     Tenderness: There is no abdominal tenderness.  Musculoskeletal:        General: Normal range of motion.     Cervical back: Normal range of motion.     Right lower leg: No edema.     Left lower  leg: No edema.  Skin:    General: Skin is warm.     Capillary Refill: Capillary refill takes less than 2 seconds.  Neurological:     General: No focal deficit present.     Mental Status: She is alert and oriented to person, place, and time.  Psychiatric:        Mood and Affect: Mood normal.    BP 140/60   Pulse 65   Temp 97.6 F (36.4 C)   Resp 16   Ht 5' (1.524 m)   Wt 115 lb (52.2 kg)   SpO2 94%   BMI 22.46 kg/m  Wt Readings from Last 3 Encounters:  06/16/21 115 lb (52.2 kg)  05/26/21 116 lb (52.6 kg)  11/26/20 115 lb (52.2 kg)    Depression screen [facility] 2/9 06/16/2021 05/26/2021 11/26/2020 10/21/2020 10/21/2020  Decreased Interest 1 1 1  0 0  Down, Depressed, Hopeless 1 2 1  0 1  PHQ - 2 Score 2 3 2  0 1  Altered sleeping 0 0 0 - -  Tired, decreased energy 1 2 1  - -  Change in appetite 0 1 0 - -  Feeling bad or failure about yourself  1 2 1  - -  Trouble concentrating 0 1 - - -  Moving slowly or fidgety/restless 0 1 0 - -  Suicidal thoughts 1 1 0 - -  PHQ-9 Score 5 11 4  - -  Difficult doing work/chores Not difficult at all Somewhat difficult - - -    Health Maintenance Due  Topic Date Due   TETANUS/TDAP  Never done   Zoster Vaccines- Shingrix (1 of 2) Never done   DEXA SCAN  Never done   COVID-19 Vaccine (3 - Booster for Pfizer series) 05/20/2020   INFLUENZA VACCINE  06/15/2021     There are no preventive care reminders to display for this patient.  Lab Results  Component Value Date   TSH 3.260 05/26/2021   Lab Results  Component Value Date   WBC 6.9 05/26/2021   HGB 13.1 05/26/2021   HCT 39.3 05/26/2021   MCV 99 (H) 05/26/2021   PLT 260 05/26/2021   Lab Results  Component Value Date   NA 139 05/26/2021   K 4.4 05/26/2021   CO2 26 05/26/2021   GLUCOSE 95 05/26/2021   BUN 18 05/26/2021   CREATININE 0.94 05/26/2021   BILITOT 0.7 05/26/2021   ALKPHOS 66 05/26/2021   AST 18 05/26/2021   ALT 15 05/26/2021   PROT 6.8 05/26/2021   ALBUMIN 4.6 05/26/2021   CALCIUM 10.0 05/26/2021   ANIONGAP 10 11/04/2019   EGFR 59 (L) 05/26/2021   Lab Results  Component Value Date   CHOL 207 (H) 05/26/2021   Lab Results  Component Value Date   HDL 68 05/26/2021   Lab Results  Component Value Date   LDLCALC 125 (H) 05/26/2021   Lab Results  Component Value Date   TRIG 80 05/26/2021   Lab Results  Component Value Date   CHOLHDL 3.0 05/26/2021   No results found for: HGBA1C    Assessment & Plan:   Problem List Items Addressed This Visit   Diagnoses and all orders for this visit: Major depressive disorder, single episode, mild ([place]) Depression has improved and patient will continue medicines  Mixed hyperlipidemia  AN INDIVIDUAL CARE PLAN for hyperlipidemia/ cholesterol was established and reinforced today.  The patient's status was assessed using clinical findings on exam, lab and other diagnostic tests. The patient's disease  status was assessed based on evidence-based guidelines and found to be well controlled. MEDICATIONS were reviewed. SELF MANAGEMENT GOALS have been discussed and patient's success at attaining the goal of low cholesterol was assessed. RECOMMENDATION given include regular exercise 3 days a week and low cholesterol/low fat diet. CLINICAL SUMMARY including written plan to identify barriers unique to the patient due to social or  economic  reasons was discussed.     Other  Follow-up: Return in about 5 months (around 11/16/2021), or december for chronic visit.    Reinaldo Meeker, MD

## 2021-06-17 DIAGNOSIS — J449 Chronic obstructive pulmonary disease, unspecified: Secondary | ICD-10-CM | POA: Diagnosis not present

## 2021-06-30 ENCOUNTER — Other Ambulatory Visit: Payer: Self-pay | Admitting: Legal Medicine

## 2021-06-30 NOTE — Telephone Encounter (Signed)
Called pt. Pt VU.   Brenda Patrick 06/30/21 4:29 PM

## 2021-06-30 NOTE — Telephone Encounter (Signed)
Pt returned call. Nurse called originally to ask if pt is taking these medications or pravastatin and effexor which is on current med list.   Pt states she has not been taking simvastatin but has been taking both citalopram and effexor. She is taking pravastatin.   Please advise.   Royce Macadamia, [place] 06/30/21 4:21 PM

## 2021-06-30 NOTE — Telephone Encounter (Signed)
Only take effexor for depression lp

## 2021-07-18 DIAGNOSIS — J449 Chronic obstructive pulmonary disease, unspecified: Secondary | ICD-10-CM | POA: Diagnosis not present

## 2021-07-21 ENCOUNTER — Other Ambulatory Visit: Payer: Self-pay | Admitting: Legal Medicine

## 2021-07-21 DIAGNOSIS — F320 Major depressive disorder, single episode, mild: Secondary | ICD-10-CM

## 2021-08-04 ENCOUNTER — Telehealth (INDEPENDENT_AMBULATORY_CARE_PROVIDER_SITE_OTHER): Payer: Medicare Other | Admitting: Legal Medicine

## 2021-08-04 ENCOUNTER — Encounter: Payer: Self-pay | Admitting: Legal Medicine

## 2021-08-04 VITALS — Temp 100.5°F | Ht 60.0 in | Wt 115.0 lb

## 2021-08-04 DIAGNOSIS — Z9189 Other specified personal risk factors, not elsewhere classified: Secondary | ICD-10-CM | POA: Diagnosis not present

## 2021-08-04 DIAGNOSIS — R6889 Other general symptoms and signs: Secondary | ICD-10-CM

## 2021-08-04 DIAGNOSIS — J028 Acute pharyngitis due to other specified organisms: Secondary | ICD-10-CM

## 2021-08-04 LAB — POCT RAPID STREP A (OFFICE): Rapid Strep A Screen: NEGATIVE

## 2021-08-04 LAB — POCT INFLUENZA A/B
Influenza A, POC: NEGATIVE
Influenza B, POC: NEGATIVE

## 2021-08-04 LAB — POC COVID19 BINAXNOW: SARS Coronavirus 2 Ag: NEGATIVE

## 2021-08-04 MED ORDER — PREDNISONE 10 MG (21) PO TBPK: ORAL_TABLET | ORAL | 0 refills | Status: DC

## 2021-08-04 MED ORDER — AZITHROMYCIN 250 MG PO TABS: ORAL_TABLET | ORAL | 0 refills | Status: AC

## 2021-08-04 NOTE — Progress Notes (Signed)
Virtual Visit via Video Note   This visit type was conducted due to national recommendations for restrictions regarding the COVID-19 Pandemic (e.g. social distancing) in an effort to limit this patient's exposure and mitigate transmission in our community.  Due to her co-morbid illnesses, this patient is at least at moderate risk for complications without adequate follow up.  This format is felt to be most appropriate for this patient at this time.  All issues noted in this document were discussed and addressed.  A limited physical exam was performed with this format.  A verbal consent was obtained for the virtual visit.   Date:  08/04/2021   ID:  Brenda Patrick Mar 01, 1932, MRN [id number]  Patient Location: Home Provider Location: Office/Clinic  PCP:  Lillard Anes, MD   Evaluation Performed:  New Patient Evaluation  Chief Complaint:  possible covid  History of Present Illness:    Brenda Patrick is a 87 y.o. female with sick with sore throat 2 days ago. Fever, headache, cough.  Clear phlegm  The patient does have symptoms concerning for COVID-19 infection (fever, chills, cough, or new shortness of breath).    Past Medical History:  Diagnosis Date   Acute bronchitis due to other specified organisms 04/25/2020   COPD (chronic obstructive pulmonary disease) ([place]) 05/26/2020   COVID-19 10/30/2019   Gastroesophageal reflux disease with esophagitis 05/26/2020   Hypertension    Major depressive disorder, single episode, mild ([place]) 05/26/2020   Mixed hyperlipidemia 05/26/2020   Moderate protein-calorie malnutrition ([place]) 05/26/2020   OSA (obstructive sleep apnea) 05/26/2020    Past Surgical History:  Procedure Laterality Date   ABDOMINAL HYSTERECTOMY     DUODENAL DIVERTICULECTOMY  04/09/1993   OVARIAN CYST REMOVAL      History reviewed. No pertinent family history.  Social History   Socioeconomic History   Marital status: Married    Spouse name: Not on file    Number of children: 1   Years of education: Not on file   Highest education level: Not on file  Occupational History   Occupation: Homemaker  Tobacco Use   Smoking status: Never   Smokeless tobacco: Never  Vaping Use   Vaping Use: Never used  Substance and Sexual Activity   Alcohol use: Never   Drug use: Never   Sexual activity: Not Currently  Other Topics Concern   Not on file  Social History Narrative   Not on file   Social Determinants of Health   Financial Resource Strain: Low Risk    Difficulty of Paying Living Expenses: Not hard at all  Food Insecurity: No Food Insecurity   Worried About Charity fundraiser in the Last Year: Never true   [place] in the Last Year: Never true  Transportation Needs: No Transportation Needs   Lack of Transportation (Medical): No   Lack of Transportation (Non-Medical): No  Physical Activity: Sufficiently Active   Days of Exercise per Week: 5 days   Minutes of Exercise per Session: 30 min  Stress: No Stress Concern Present   Feeling of Stress : Not at all  Social Connections: Moderately Isolated   Frequency of Communication with Friends and Family: More than three times a week   Frequency of Social Gatherings with Friends and Family: Once a week   Attends Religious Services: More than 4 times per year   Active Member of [employer] or Organizations: No   Attends Archivist Meetings: Never   Marital Status:  Widowed  Human resources officer Violence: Not At Risk   Fear of Current or Ex-Partner: No   Emotionally Abused: No   Physically Abused: No   Sexually Abused: No    Outpatient Medications Prior to Visit  Medication Sig Dispense Refill   albuterol (PROVENTIL) (2.5 MG/3ML) 0.083% nebulizer solution Take 3 mLs (2.5 mg total) by nebulization every 6 (six) hours as needed for wheezing or shortness of breath. 90 mL 11   atenolol (TENORMIN) 50 MG tablet TAKE 1 TABLET(50 MG) BY MOUTH DAILY 90 tablet 2   busPIRone (BUSPAR) 10 MG  tablet TAKE 1 TABLET(10 MG) BY MOUTH THREE TIMES DAILY 90 tablet 2   ferrous sulfate 325 (65 FE) MG tablet Take by mouth.     fluticasone (FLONASE) 50 MCG/ACT nasal spray SHAKE LIQUID AND USE 1 SPRAY IN EACH NOSTRIL AT BEDTIME 16 g 6   LORazepam (ATIVAN) 1 MG tablet TAKE 1 TABLET BY MOUTH TWICE DAILY AS NEEDED (Patient taking differently: Take 1 mg by mouth at bedtime.) 60 tablet 3   losartan-hydrochlorothiazide (HYZAAR) 100-12.5 MG tablet TAKE 1 TABLET BY MOUTH DAILY 90 tablet 1   Multiple Vitamins-Minerals (MULTIVITAMIN WOMEN 50+ PO) Take 1 tablet by mouth daily.     omeprazole (PRILOSEC) 40 MG capsule Take 1 capsule (40 mg total) by mouth daily. 90 capsule 2   pravastatin (PRAVACHOL) 40 MG tablet Take 40 mg by mouth daily as needed.     PREMARIN vaginal cream SMARTSIG:1 Vaginal Every Night     venlafaxine XR (EFFEXOR XR) 37.5 MG 24 hr capsule Take 1 capsule (37.5 mg total) by mouth daily with breakfast. 30 capsule 3   zinc gluconate 50 MG tablet Take 50 mg by mouth daily.     No facility-administered medications prior to visit.    Allergies:   Ceftin [cefuroxime] and Lisinopril   Social History   Tobacco Use   Smoking status: Never   Smokeless tobacco: Never  Vaping Use   Vaping Use: Never used  Substance Use Topics   Alcohol use: Never   Drug use: Never     Review of Systems  Constitutional:  Positive for malaise/fatigue. Negative for chills and fever.  HENT:  Positive for congestion.   Eyes:  Negative for redness.  Respiratory:  Positive for cough and sputum production.   Cardiovascular:  Negative for palpitations.  Gastrointestinal: Negative.   Genitourinary: Negative.   Musculoskeletal:  Positive for myalgias.  Neurological:  Positive for headaches.    Labs/Other Tests and Data Reviewed:    Recent Labs: 05/26/2021: ALT 15; BUN 18; Creatinine, Ser 0.94; Hemoglobin 13.1; Platelets 260; Potassium 4.4; Sodium 139; TSH 3.260   Recent Lipid Panel Lab Results  Component  Value Date/Time   CHOL 207 (H) 05/26/2021 11:06 AM   TRIG 80 05/26/2021 11:06 AM   HDL 68 05/26/2021 11:06 AM   CHOLHDL 3.0 05/26/2021 11:06 AM   LDLCALC 125 (H) 05/26/2021 11:06 AM    Wt Readings from Last 3 Encounters:  08/04/21 115 lb (52.2 kg)  06/16/21 115 lb (52.2 kg)  05/26/21 116 lb (52.6 kg)     Objective:    Vital Signs:  Temp (!) 100.5 F (38.1 C)   Ht 5' (1.524 m)   Wt 115 lb (52.2 kg)   BMI 22.46 kg/m    Physical Exam reviewed  ASSESSMENT & PLAN:   Diagnoses and all orders for this visit: At increased risk of exposure to COVID-19 virus -     POC COVID-19 -  Rapid Strep A Covid test normal  Flu-like symptoms -     Influenza A/B -     azithromycin (ZITHROMAX) 250 MG tablet; Take 2 tablets on day 1, then 1 tablet daily on days 2 through 5 Treat as sinusitis  Pharyngitis due to other organism -     Rapid Strep A -     predniSONE (STERAPRED UNI-PAK 21 TAB) 10 MG (21) TBPK tablet; Take 6ills first day , then 5 pills day 2 and then cut down one pill day until gone  Prednisone for cough and sputum       COVID-19 Education: The signs and symptoms of COVID-19 were discussed with the patient and how to seek care for testing (follow up with PCP or arrange E-visit). The importance of social distancing was discussed today.   I spent 20 minutes dedicated to the care of this patient on the date of this encounter to include face-to-face time with the patient, as well as:   Follow Up:  In Person prn  Signed, Reinaldo Meeker, MD  08/04/2021 11:49 AM    [address]

## 2021-08-11 ENCOUNTER — Other Ambulatory Visit: Payer: Self-pay

## 2021-08-11 ENCOUNTER — Encounter: Payer: Self-pay | Admitting: Legal Medicine

## 2021-08-11 ENCOUNTER — Ambulatory Visit (INDEPENDENT_AMBULATORY_CARE_PROVIDER_SITE_OTHER): Payer: Medicare Other | Admitting: Legal Medicine

## 2021-08-11 ENCOUNTER — Other Ambulatory Visit: Payer: Self-pay | Admitting: Legal Medicine

## 2021-08-11 VITALS — BP 110/60 | HR 79 | Temp 97.6°F | Resp 15 | Ht 60.0 in | Wt 116.0 lb

## 2021-08-11 DIAGNOSIS — N762 Acute vulvitis: Secondary | ICD-10-CM | POA: Diagnosis not present

## 2021-08-11 DIAGNOSIS — R350 Frequency of micturition: Secondary | ICD-10-CM | POA: Diagnosis not present

## 2021-08-11 LAB — POCT URINALYSIS DIP (CLINITEK)
Bilirubin, UA: NEGATIVE
Glucose, UA: NEGATIVE mg/dL
Ketones, POC UA: NEGATIVE mg/dL
Leukocytes, UA: NEGATIVE
Nitrite, UA: NEGATIVE
Spec Grav, UA: 1.02 (ref 1.010–1.025)
Urobilinogen, UA: 0.2 E.U./dL
pH, UA: 6 (ref 5.0–8.0)

## 2021-08-11 MED ORDER — MUPIROCIN CALCIUM 2 % EX CREA: 1.0000 "application " | TOPICAL_CREAM | Freq: Two times a day (BID) | CUTANEOUS | 2 refills | Status: DC

## 2021-08-11 NOTE — Progress Notes (Signed)
Acute Office Visit  Subjective:    Patient ID: Brenda Patrick, female    DOB: [birth year], 87 y.o.   MRN: [id number]  Chief Complaint  Patient presents with   Urinary Frequency    Patient noticed urinary frequency since almost 2 weeks ago.    HPI Patient is in today for dysuria after having diarrhea for one week.  She is burning on vulva.  UA normal, she is on premarin cream regularly.  Past Medical History:  Diagnosis Date   Acute bronchitis due to other specified organisms 04/25/2020   COPD (chronic obstructive pulmonary disease) ([place]) 05/26/2020   COVID-19 10/30/2019   Gastroesophageal reflux disease with esophagitis 05/26/2020   Hypertension    Major depressive disorder, single episode, mild ([place]) 05/26/2020   Mixed hyperlipidemia 05/26/2020   Moderate protein-calorie malnutrition ([place]) 05/26/2020   OSA (obstructive sleep apnea) 05/26/2020    Past Surgical History:  Procedure Laterality Date   ABDOMINAL HYSTERECTOMY     DUODENAL DIVERTICULECTOMY  04/09/1993   OVARIAN CYST REMOVAL      History reviewed. No pertinent family history.  Social History   Socioeconomic History   Marital status: Married    Spouse name: Not on file   Number of children: 1   Years of education: Not on file   Highest education level: Not on file  Occupational History   Occupation: Homemaker  Tobacco Use   Smoking status: Never   Smokeless tobacco: Never  Vaping Use   Vaping Use: Never used  Substance and Sexual Activity   Alcohol use: Never   Drug use: Never   Sexual activity: Not Currently  Other Topics Concern   Not on file  Social History Narrative   Not on file   Social Determinants of Health   Financial Resource Strain: Low Risk    Difficulty of Paying Living Expenses: Not hard at all  Food Insecurity: No Food Insecurity   Worried About Charity fundraiser in the Last Year: Never true   [place] in the Last Year: Never true  Transportation Needs: No  Transportation Needs   Lack of Transportation (Medical): No   Lack of Transportation (Non-Medical): No  Physical Activity: Sufficiently Active   Days of Exercise per Week: 5 days   Minutes of Exercise per Session: 30 min  Stress: No Stress Concern Present   Feeling of Stress : Not at all  Social Connections: Moderately Isolated   Frequency of Communication with Friends and Family: More than three times a week   Frequency of Social Gatherings with Friends and Family: Once a week   Attends Religious Services: More than 4 times per year   Active Member of [employer] or Organizations: No   Attends Archivist Meetings: Never   Marital Status: Widowed  Human resources officer Violence: Not At Risk   Fear of Current or Ex-Partner: No   Emotionally Abused: No   Physically Abused: No   Sexually Abused: No    Outpatient Medications Prior to Visit  Medication Sig Dispense Refill   albuterol (PROVENTIL) (2.5 MG/3ML) 0.083% nebulizer solution Take 3 mLs (2.5 mg total) by nebulization every 6 (six) hours as needed for wheezing or shortness of breath. 90 mL 11   atenolol (TENORMIN) 50 MG tablet TAKE 1 TABLET(50 MG) BY MOUTH DAILY 90 tablet 2   busPIRone (BUSPAR) 10 MG tablet TAKE 1 TABLET(10 MG) BY MOUTH THREE TIMES DAILY 90 tablet 2   ferrous sulfate 325 (65 FE) MG  tablet Take by mouth.     fluticasone (FLONASE) 50 MCG/ACT nasal spray SHAKE LIQUID AND USE 1 SPRAY IN EACH NOSTRIL AT BEDTIME 16 g 6   LORazepam (ATIVAN) 1 MG tablet TAKE 1 TABLET BY MOUTH TWICE DAILY AS NEEDED 60 tablet 3   losartan-hydrochlorothiazide (HYZAAR) 100-12.5 MG tablet TAKE 1 TABLET BY MOUTH DAILY 90 tablet 1   Multiple Vitamins-Minerals (MULTIVITAMIN WOMEN 50+ PO) Take 1 tablet by mouth daily.     omeprazole (PRILOSEC) 40 MG capsule Take 1 capsule (40 mg total) by mouth daily. 90 capsule 2   pravastatin (PRAVACHOL) 40 MG tablet Take 40 mg by mouth daily as needed.     PREMARIN vaginal cream SMARTSIG:1 Vaginal Every Night      venlafaxine XR (EFFEXOR XR) 37.5 MG 24 hr capsule Take 1 capsule (37.5 mg total) by mouth daily with breakfast. 30 capsule 3   zinc gluconate 50 MG tablet Take 50 mg by mouth daily.     LORazepam (ATIVAN) 1 MG tablet TAKE 1 TABLET BY MOUTH TWICE DAILY AS NEEDED (Patient taking differently: Take 1 mg by mouth at bedtime.) 60 tablet 3   predniSONE (STERAPRED UNI-PAK 21 TAB) 10 MG (21) TBPK tablet Take 6ills first day , then 5 pills day 2 and then cut down one pill day until gone 21 tablet 0   No facility-administered medications prior to visit.    Allergies  Allergen Reactions   Ceftin [Cefuroxime] Diarrhea   Lisinopril Diarrhea    Review of Systems  Constitutional:  Negative for activity change and appetite change.  HENT:  Negative for congestion.   Eyes:  Negative for visual disturbance.  Respiratory:  Negative for shortness of breath.   Cardiovascular:  Negative for chest pain and palpitations.  Gastrointestinal:  Negative for abdominal distention and abdominal pain.  Endocrine: Negative for polyuria.  Genitourinary:  Negative for difficulty urinating and dysuria.  Musculoskeletal:  Negative for arthralgias and back pain.  Neurological: Negative.   Psychiatric/Behavioral: Negative.        Objective:    Physical Exam Vitals reviewed.  Constitutional:      General: She is not in acute distress.    Appearance: Normal appearance.  HENT:     Right Ear: Tympanic membrane normal.     Left Ear: Tympanic membrane normal.  Cardiovascular:     Rate and Rhythm: Normal rate and regular rhythm.     Pulses: Normal pulses.     Heart sounds: Normal heart sounds. No murmur heard.   No gallop.  Pulmonary:     Effort: Pulmonary effort is normal. No respiratory distress.     Breath sounds: Normal breath sounds. No wheezing.  Abdominal:     General: Abdomen is flat. Bowel sounds are normal. There is no distension.     Tenderness: There is no abdominal tenderness.  Musculoskeletal:         General: Normal range of motion.  Skin:    Capillary Refill: Capillary refill takes less than 2 seconds.     Comments: Vulvar irritation  Neurological:     General: No focal deficit present.     Mental Status: She is alert and oriented to person, place, and time. Mental status is at baseline.    BP 110/60   Pulse 79   Temp 97.6 F (36.4 C)   Resp 15   Ht 5' (1.524 m)   Wt 116 lb (52.6 kg)   SpO2 94%   BMI 22.65 kg/m  Wt Readings  from Last 3 Encounters:  08/11/21 116 lb (52.6 kg)  08/04/21 115 lb (52.2 kg)  06/16/21 115 lb (52.2 kg)    Health Maintenance Due  Topic Date Due   TETANUS/TDAP  Never done   Zoster Vaccines- Shingrix (1 of 2) Never done   DEXA SCAN  Never done   COVID-19 Vaccine (3 - Booster for Pfizer series) 05/20/2020   INFLUENZA VACCINE  06/15/2021    There are no preventive care reminders to display for this patient.   Lab Results  Component Value Date   TSH 3.260 05/26/2021   Lab Results  Component Value Date   WBC 6.9 05/26/2021   HGB 13.1 05/26/2021   HCT 39.3 05/26/2021   MCV 99 (H) 05/26/2021   PLT 260 05/26/2021   Lab Results  Component Value Date   NA 139 05/26/2021   K 4.4 05/26/2021   CO2 26 05/26/2021   GLUCOSE 95 05/26/2021   BUN 18 05/26/2021   CREATININE 0.94 05/26/2021   BILITOT 0.7 05/26/2021   ALKPHOS 66 05/26/2021   AST 18 05/26/2021   ALT 15 05/26/2021   PROT 6.8 05/26/2021   ALBUMIN 4.6 05/26/2021   CALCIUM 10.0 05/26/2021   ANIONGAP 10 11/04/2019   EGFR 59 (L) 05/26/2021   Lab Results  Component Value Date   CHOL 207 (H) 05/26/2021   Lab Results  Component Value Date   HDL 68 05/26/2021   Lab Results  Component Value Date   LDLCALC 125 (H) 05/26/2021   Lab Results  Component Value Date   TRIG 80 05/26/2021   Lab Results  Component Value Date   CHOLHDL 3.0 05/26/2021   No results found for: HGBA1C     Assessment & Plan:  Diagnoses and all orders for this visit: Frequency of urination -      POCT URINALYSIS DIP (CLINITEK) Negative urinalysis  Acute vulvitis -     mupirocin cream (BACTROBAN) 2 %; Apply 1 application topically 2 (two) times daily.  Apply bactroban bid continue uses premarin   Orders Placed This Encounter  Procedures   POCT URINALYSIS DIP (CLINITEK)     I spent 15 minutes dedicated to the care of this patient on the date of this encounter to include face-to-face time with the patient, as well as:   Follow-up: Return if symptoms worsen or fail to improve.  An After Visit Summary was printed and given to the patient.  Reinaldo Meeker, MD [facility] [phone]

## 2021-08-13 DIAGNOSIS — L820 Inflamed seborrheic keratosis: Secondary | ICD-10-CM | POA: Diagnosis not present

## 2021-08-17 DIAGNOSIS — J449 Chronic obstructive pulmonary disease, unspecified: Secondary | ICD-10-CM | POA: Diagnosis not present

## 2021-08-29 ENCOUNTER — Other Ambulatory Visit: Payer: Self-pay | Admitting: Legal Medicine

## 2021-08-29 DIAGNOSIS — F320 Major depressive disorder, single episode, mild: Secondary | ICD-10-CM

## 2021-09-08 ENCOUNTER — Ambulatory Visit (INDEPENDENT_AMBULATORY_CARE_PROVIDER_SITE_OTHER): Payer: Medicare Other

## 2021-09-08 DIAGNOSIS — Z23 Encounter for immunization: Secondary | ICD-10-CM

## 2021-09-17 DIAGNOSIS — J449 Chronic obstructive pulmonary disease, unspecified: Secondary | ICD-10-CM | POA: Diagnosis not present

## 2021-09-18 ENCOUNTER — Other Ambulatory Visit: Payer: Self-pay | Admitting: Legal Medicine

## 2021-09-18 DIAGNOSIS — F320 Major depressive disorder, single episode, mild: Secondary | ICD-10-CM

## 2021-10-17 DIAGNOSIS — J449 Chronic obstructive pulmonary disease, unspecified: Secondary | ICD-10-CM | POA: Diagnosis not present

## 2021-11-02 ENCOUNTER — Telehealth: Payer: Self-pay

## 2021-11-02 ENCOUNTER — Other Ambulatory Visit: Payer: Self-pay | Admitting: Legal Medicine

## 2021-11-02 MED ORDER — AZITHROMYCIN 250 MG PO TABS
ORAL_TABLET | ORAL | 0 refills | Status: AC
Start: 2021-11-02 — End: 2021-11-07

## 2021-11-02 NOTE — Telephone Encounter (Signed)
Sent z-pack into [facility]

## 2021-11-02 NOTE — Telephone Encounter (Signed)
Patient had left a message stating that she has a cough and sore throat - she believes due to the weather change.  She would like something sent in because she does not have a ride to the office.  I called patient back and did not get an answer.  She needs to come in for testing.

## 2021-11-03 ENCOUNTER — Encounter: Payer: Self-pay | Admitting: Legal Medicine

## 2021-11-03 ENCOUNTER — Telehealth (INDEPENDENT_AMBULATORY_CARE_PROVIDER_SITE_OTHER): Payer: Medicare Other | Admitting: Legal Medicine

## 2021-11-03 ENCOUNTER — Other Ambulatory Visit: Payer: Self-pay

## 2021-11-03 VITALS — Temp 100.3°F | Ht 60.0 in | Wt 116.0 lb

## 2021-11-03 DIAGNOSIS — U071 COVID-19: Secondary | ICD-10-CM | POA: Diagnosis not present

## 2021-11-03 MED ORDER — NIRMATRELVIR/RITONAVIR (PAXLOVID)TABLET: 3.0000 | ORAL_TABLET | Freq: Two times a day (BID) | ORAL | 0 refills | Status: AC

## 2021-11-03 NOTE — Progress Notes (Signed)
Virtual Visit via Video Note   This visit type was conducted due to national recommendations for restrictions regarding the COVID-19 Pandemic (e.g. social distancing) in an effort to limit this patient's exposure and mitigate transmission in our community.  Due to her co-morbid illnesses, this patient is at least at moderate risk for complications without adequate follow up.  This format is felt to be most appropriate for this patient at this time.  All issues noted in this document were discussed and addressed.  A limited physical exam was performed with this format.  A verbal consent was obtained for the virtual visit.   Date:  11/03/2021   ID:  Patrick, Brenda 1932/06/09, MRN [id number]  Patient Location: Home Provider Location: Office/Clinic  PCP:  Lillard Anes, MD   Evaluation Performed:  New Patient Evaluation  Chief Complaint:  positive Covid  History of Present Illness:    Brenda Patrick is a 87 y.o. female with covid positive this am. Sick for 3 days. No fever or chills. Coughing.  Started z-pack.  The patient does have symptoms concerning for COVID-19 infection (fever, chills, cough, or new shortness of breath).    Past Medical History:  Diagnosis Date   Acute bronchitis due to other specified organisms 04/25/2020   COPD (chronic obstructive pulmonary disease) ([place]) 05/26/2020   COVID-19 10/30/2019   Gastroesophageal reflux disease with esophagitis 05/26/2020   Hypertension    Major depressive disorder, single episode, mild ([place]) 05/26/2020   Mixed hyperlipidemia 05/26/2020   Moderate protein-calorie malnutrition ([place]) 05/26/2020   OSA (obstructive sleep apnea) 05/26/2020    Past Surgical History:  Procedure Laterality Date   ABDOMINAL HYSTERECTOMY     DUODENAL DIVERTICULECTOMY  04/09/1993   OVARIAN CYST REMOVAL      History reviewed. No pertinent family history.  Social History   Socioeconomic History   Marital status: Married    Spouse  name: Not on file   Number of children: 1   Years of education: Not on file   Highest education level: Not on file  Occupational History   Occupation: Homemaker  Tobacco Use   Smoking status: Never   Smokeless tobacco: Never  Vaping Use   Vaping Use: Never used  Substance and Sexual Activity   Alcohol use: Never   Drug use: Never   Sexual activity: Not Currently  Other Topics Concern   Not on file  Social History Narrative   Not on file   Social Determinants of Health   Financial Resource Strain: Not on file  Food Insecurity: Not on file  Transportation Needs: Not on file  Physical Activity: Not on file  Stress: Not on file  Social Connections: Not on file  Intimate Partner Violence: Not on file    Outpatient Medications Prior to Visit  Medication Sig Dispense Refill   albuterol (PROVENTIL) (2.5 MG/3ML) 0.083% nebulizer solution Take 3 mLs (2.5 mg total) by nebulization every 6 (six) hours as needed for wheezing or shortness of breath. 90 mL 11   atenolol (TENORMIN) 50 MG tablet TAKE 1 TABLET(50 MG) BY MOUTH DAILY 90 tablet 2   azithromycin (ZITHROMAX) 250 MG tablet Take 2 tablets on day 1, then 1 tablet daily on days 2 through 5 6 tablet 0   busPIRone (BUSPAR) 10 MG tablet TAKE 1 TABLET(10 MG) BY MOUTH THREE TIMES DAILY 90 tablet 2   ferrous sulfate 325 (65 FE) MG tablet Take by mouth.     fluticasone (FLONASE) 50 MCG/ACT nasal  spray SHAKE LIQUID AND USE 1 SPRAY IN EACH NOSTRIL AT BEDTIME 16 g 6   LORazepam (ATIVAN) 1 MG tablet TAKE 1 TABLET BY MOUTH TWICE DAILY AS NEEDED 60 tablet 3   losartan-hydrochlorothiazide (HYZAAR) 100-12.5 MG tablet TAKE 1 TABLET BY MOUTH DAILY 90 tablet 1   Multiple Vitamins-Minerals (MULTIVITAMIN WOMEN 50+ PO) Take 1 tablet by mouth daily.     mupirocin cream (BACTROBAN) 2 % Apply 1 application topically 2 (two) times daily. 15 g 2   mupirocin ointment (BACTROBAN) 2 % Apply topically 2 (two) times daily.     omeprazole (PRILOSEC) 40 MG capsule  Take 1 capsule (40 mg total) by mouth daily. 90 capsule 2   pravastatin (PRAVACHOL) 40 MG tablet Take 40 mg by mouth daily as needed.     PREMARIN vaginal cream SMARTSIG:1 Vaginal Every Night     venlafaxine XR (EFFEXOR-XR) 37.5 MG 24 hr capsule TAKE 1 CAPSULE(37.5 MG) BY MOUTH DAILY WITH BREAKFAST 90 capsule 0   zinc gluconate 50 MG tablet Take 50 mg by mouth daily.     No facility-administered medications prior to visit.    Allergies:   Ceftin [cefuroxime] and Lisinopril   Social History   Tobacco Use   Smoking status: Never   Smokeless tobacco: Never  Vaping Use   Vaping Use: Never used  Substance Use Topics   Alcohol use: Never   Drug use: Never     Review of Systems  Constitutional:  Negative for chills and fever.  HENT:  Positive for congestion.   Respiratory:  Positive for cough.   Cardiovascular:  Negative for chest pain.  Genitourinary:  Negative for dysuria.  Musculoskeletal:  Positive for myalgias.  Neurological:  Positive for headaches.  Psychiatric/Behavioral:  Negative for depression.     Labs/Other Tests and Data Reviewed:    Recent Labs: 05/26/2021: ALT 15; BUN 18; Creatinine, Ser 0.94; Hemoglobin 13.1; Platelets 260; Potassium 4.4; Sodium 139; TSH 3.260   Recent Lipid Panel Lab Results  Component Value Date/Time   CHOL 207 (H) 05/26/2021 11:06 AM   TRIG 80 05/26/2021 11:06 AM   HDL 68 05/26/2021 11:06 AM   CHOLHDL 3.0 05/26/2021 11:06 AM   LDLCALC 125 (H) 05/26/2021 11:06 AM    Wt Readings from Last 3 Encounters:  11/03/21 116 lb (52.6 kg)  08/11/21 116 lb (52.6 kg)  08/04/21 115 lb (52.2 kg)     Objective:    Vital Signs:  Temp 100.3 F (37.9 C)    Ht 5' (1.524 m)    Wt 116 lb (52.6 kg)    BMI 22.65 kg/m    Physical Exam reviewed  ASSESSMENT & PLAN:   1. COVID - nirmatrelvir/ritonavir EUA (PAXLOVID) 20 x 150 MG & 10 x 100MG  TABS; Take 3 tablets by mouth 2 (two) times daily for 5 days. (Take nirmatrelvir 150 mg two tablets twice daily  for 5 days and ritonavir 100 mg one tablet twice daily for 5 days) Patient GFR is 59  Dispense: 30 tablet; Refill: 0  Patient has positive home Covid test with cough, no fever     Meds ordered this encounter  Medications   nirmatrelvir/ritonavir EUA (PAXLOVID) 20 x 150 MG & 10 x 100MG  TABS    Sig: Take 3 tablets by mouth 2 (two) times daily for 5 days. (Take nirmatrelvir 150 mg two tablets twice daily for 5 days and ritonavir 100 mg one tablet twice daily for 5 days) Patient GFR is 59    Dispense:  30 tablet    Refill:  0    COVID-19 Education: The signs and symptoms of COVID-19 were discussed with the patient and how to seek care for testing (follow up with PCP or arrange E-visit). The importance of social distancing was discussed today.   I spent 20 minutes dedicated to the care of this patient on the date of this encounter to include face-to-face time with the patient, as well as:   Follow Up:  In Person prn  Signed, Reinaldo Meeker, MD  11/03/2021 1:39 PM    [place]

## 2021-11-03 NOTE — Telephone Encounter (Signed)
Patient left VM that she took COVID-19 home test and is positive.  Virtual appointment made.   Brenda Patrick, [place] 11/03/21 12:06 PM

## 2021-11-03 NOTE — Telephone Encounter (Signed)
I left detailed message on voicemail. ?

## 2021-11-12 DIAGNOSIS — R051 Acute cough: Secondary | ICD-10-CM | POA: Diagnosis not present

## 2021-11-12 DIAGNOSIS — J069 Acute upper respiratory infection, unspecified: Secondary | ICD-10-CM | POA: Diagnosis not present

## 2021-11-13 ENCOUNTER — Other Ambulatory Visit: Payer: Self-pay | Admitting: Legal Medicine

## 2021-11-13 DIAGNOSIS — J449 Chronic obstructive pulmonary disease, unspecified: Secondary | ICD-10-CM

## 2021-11-16 NOTE — Progress Notes (Signed)
Subjective:  Patient ID: Brenda Patrick, female    DOB: May 22, 1932  Age: 88 y.o. MRN: [id number]  Chief Complaint  Patient presents with   Hypertension   Hyperlipidemia   Depression    HPI: chronic visit  Patient presents for follow up of hypertension.  Patient tolerating atenolol, losartan-HCTZ well with side effects.  Patient was diagnosed with hypertension 2010 so has been treated for hypertension for 12 years.Patient is working on maintaining diet and exercise regimen and follows up as directed. Complication include none.   AN INDIVIDUAL CARE PLAN for hyperlipidemia/ cholesterol was established and reinforced today.  The patient's status was assessed using clinical findings on exam, lab and other diagnostic tests. The patient's disease status was assessed based on evidence-based guidelines and found to be well controlled. MEDICATIONS were reviewed. SELF MANAGEMENT GOALS have been discussed and patient's success at attaining the goal of low cholesterol was assessed. RECOMMENDATION given include regular exercise 3 days a week and low cholesterol/low fat diet. CLINICAL SUMMARY including written plan to identify barriers unique to the patient due to social or economic  reasons was discussed.   Patient's depression is controlled with venlafacine.   Anhedonia better.  PHQ 9 was performed score 4. An individual care plan was established or reinforced today.  The patient's disease status was assessed using clinical findings on exam, labs, and or other diagnostic testing to determine patient's success in meeting treatment goals based on disease specific evidence-based guidelines and found to be improving Recommendations include stay on medicines    Current Outpatient Medications on File Prior to Visit  Medication Sig Dispense Refill   albuterol (PROVENTIL) (2.5 MG/3ML) 0.083% nebulizer solution Use 1 Vial In Nebulizer Every 6 Hours As Needed For Wheezing or Shortness of Breath 90 mL 11    atenolol (TENORMIN) 50 MG tablet TAKE 1 TABLET(50 MG) BY MOUTH DAILY 90 tablet 2   busPIRone (BUSPAR) 10 MG tablet TAKE 1 TABLET(10 MG) BY MOUTH THREE TIMES DAILY 90 tablet 2   doxycycline (VIBRAMYCIN) 100 MG capsule Take 100 mg by mouth 2 (two) times daily.     ferrous sulfate 325 (65 FE) MG tablet Take by mouth.     fluticasone (FLONASE) 50 MCG/ACT nasal spray SHAKE LIQUID AND USE 1 SPRAY IN EACH NOSTRIL AT BEDTIME 16 g 6   LORazepam (ATIVAN) 1 MG tablet TAKE 1 TABLET BY MOUTH TWICE DAILY AS NEEDED 60 tablet 3   losartan-hydrochlorothiazide (HYZAAR) 100-12.5 MG tablet TAKE 1 TABLET BY MOUTH DAILY 90 tablet 1   Multiple Vitamins-Minerals (MULTIVITAMIN WOMEN 50+ PO) Take 1 tablet by mouth daily.     mupirocin cream (BACTROBAN) 2 % Apply 1 application topically 2 (two) times daily. 15 g 2   mupirocin ointment (BACTROBAN) 2 % Apply topically 2 (two) times daily.     omeprazole (PRILOSEC) 40 MG capsule Take 1 capsule (40 mg total) by mouth daily. 90 capsule 2   pravastatin (PRAVACHOL) 40 MG tablet Take 40 mg by mouth daily as needed.     PREMARIN vaginal cream SMARTSIG:1 Vaginal Every Night     venlafaxine XR (EFFEXOR-XR) 37.5 MG 24 hr capsule TAKE 1 CAPSULE(37.5 MG) BY MOUTH DAILY WITH BREAKFAST 90 capsule 0   zinc gluconate 50 MG tablet Take 50 mg by mouth daily.     No current facility-administered medications on file prior to visit.   Past Medical History:  Diagnosis Date   Acute bronchitis due to other specified organisms 04/25/2020   COPD (chronic obstructive  pulmonary disease) ([place]) 05/26/2020   COVID-19 10/30/2019   Gastroesophageal reflux disease with esophagitis 05/26/2020   Hypertension    Major depressive disorder, single episode, mild ([place]) 05/26/2020   Mixed hyperlipidemia 05/26/2020   Moderate protein-calorie malnutrition ([place]) 05/26/2020   OSA (obstructive sleep apnea) 05/26/2020   Past Surgical History:  Procedure Laterality Date   ABDOMINAL HYSTERECTOMY     DUODENAL  DIVERTICULECTOMY  04/09/1993   OVARIAN CYST REMOVAL      History reviewed. No pertinent family history. Social History   Socioeconomic History   Marital status: Married    Spouse name: Not on file   Number of children: 1   Years of education: Not on file   Highest education level: Not on file  Occupational History   Occupation: Homemaker  Tobacco Use   Smoking status: Never   Smokeless tobacco: Never  Vaping Use   Vaping Use: Never used  Substance and Sexual Activity   Alcohol use: Never   Drug use: Never   Sexual activity: Not Currently  Other Topics Concern   Not on file  Social History Narrative   Not on file   Social Determinants of Health   Financial Resource Strain: Not on file  Food Insecurity: Not on file  Transportation Needs: Not on file  Physical Activity: Not on file  Stress: Not on file  Social Connections: Not on file    Review of Systems  Constitutional:  Negative for chills, fatigue and fever.  HENT:  Negative for congestion, ear pain and sore throat.   Respiratory:  Positive for cough. Negative for shortness of breath.   Cardiovascular:  Negative for chest pain and palpitations.  Gastrointestinal:  Negative for abdominal pain, constipation, diarrhea, nausea and vomiting.  Endocrine: Negative for polydipsia, polyphagia and polyuria.  Genitourinary:  Negative for difficulty urinating and dysuria.  Musculoskeletal:  Negative for arthralgias, back pain and myalgias.  Skin:  Negative for rash.  Neurological:  Negative for headaches.  Psychiatric/Behavioral:  Negative for dysphoric mood. The patient is not nervous/anxious.     Objective:  BP 110/80    Pulse 74    Temp 98.8 F (37.1 C)    Resp 15    Ht 5' (1.524 m)    Wt 118 lb (53.5 kg)    SpO2 97%    BMI 23.05 kg/m   BP/Weight 11/17/2021 11/03/2021 1/70/0174  Systolic BP 944 - 967  Diastolic BP 80 - 60  Wt. (Lbs) 118 116 116  BMI 23.05 22.65 22.65   Depression screen [facility] 2/9 11/17/2021 06/16/2021  05/26/2021 11/26/2020 10/21/2020  Decreased Interest 1 1 1 1  0  Down, Depressed, Hopeless 1 1 2 1  0  PHQ - 2 Score 2 2 3 2  0  Altered sleeping 0 0 0 0 -  Tired, decreased energy 1 1 2 1  -  Change in appetite 0 0 1 0 -  Feeling bad or failure about yourself  1 1 2 1  -  Trouble concentrating 0 0 1 - -  Moving slowly or fidgety/restless 0 0 1 0 -  Suicidal thoughts 0 1 1 0 -  PHQ-9 Score 4 5 11 4  -  Difficult doing work/chores Not difficult at all Not difficult at all Somewhat difficult - -     Physical Exam Vitals reviewed.  Constitutional:      General: She is not in acute distress.    Appearance: Normal appearance. She is obese.  HENT:     Right Ear: Tympanic membrane,  ear canal and external ear normal.     Left Ear: Tympanic membrane, ear canal and external ear normal.     Mouth/Throat:     Mouth: Mucous membranes are moist.     Pharynx: Oropharynx is clear.  Eyes:     Extraocular Movements: Extraocular movements intact.     Conjunctiva/sclera: Conjunctivae normal.     Pupils: Pupils are equal, round, and reactive to light.  Cardiovascular:     Rate and Rhythm: Normal rate and regular rhythm.     Pulses: Normal pulses.     Heart sounds: Normal heart sounds. No murmur heard.   No gallop.  Pulmonary:     Effort: Pulmonary effort is normal. No respiratory distress.     Breath sounds: Normal breath sounds. No wheezing.  Abdominal:     General: Abdomen is flat. Bowel sounds are normal. There is no distension.     Palpations: Abdomen is soft.     Tenderness: There is no abdominal tenderness.  Musculoskeletal:        General: Normal range of motion.     Cervical back: Normal range of motion.     Right lower leg: Edema present.     Left lower leg: No edema.  Skin:    General: Skin is warm.     Capillary Refill: Capillary refill takes less than 2 seconds.  Neurological:     General: No focal deficit present.     Mental Status: She is alert and oriented to person, place, and  time. Mental status is at baseline.  Psychiatric:        Mood and Affect: Mood normal.        Thought Content: Thought content normal.        Lab Results  Component Value Date   WBC 6.9 05/26/2021   HGB 13.1 05/26/2021   HCT 39.3 05/26/2021   PLT 260 05/26/2021   GLUCOSE 95 05/26/2021   CHOL 207 (H) 05/26/2021   TRIG 80 05/26/2021   HDL 68 05/26/2021   LDLCALC 125 (H) 05/26/2021   ALT 15 05/26/2021   AST 18 05/26/2021   NA 139 05/26/2021   K 4.4 05/26/2021   CL 99 05/26/2021   CREATININE 0.94 05/26/2021   BUN 18 05/26/2021   CO2 26 05/26/2021   TSH 3.260 05/26/2021      Assessment & Plan:   Problem List Items Addressed This Visit       Cardiovascular and Mediastinum   Essential hypertension - Primary   Relevant Orders   Comprehensive metabolic panel   CBC with Differential/Platelet   AMB Referral to [place] An individual hypertension care plan was established and reinforced today.  The patient's status was assessed using clinical findings on exam and labs or diagnostic tests. The patient's success at meeting treatment goals on disease specific evidence-based guidelines and found to be well controlled. SELF MANAGEMENT: The patient and I together assessed ways to personally work towards obtaining the recommended goals. RECOMMENDATIONS: avoid decongestants found in common cold remedies, decrease consumption of alcohol, perform routine monitoring of BP with home BP cuff, exercise, reduction of dietary salt, take medicines as prescribed, try not to miss doses and quit smoking.  Regular exercise and maintaining a healthy weight is needed.  Stress reduction may help. A CLINICAL SUMMARY including written plan identify barriers to care unique to individual due to social or financial issues.  We attempt to mutually creat solutions for individual and family understanding.  Atherosclerosis of aorta (HCC) Aortic atherosclerosis on pravastatin, we discussed  higher dose- she will consider     Respiratory   OSA (obstructive sleep apnea) Using CPAP consistently every night and medically benefiting from its use.     COPD (chronic obstructive pulmonary disease) ([place])   Relevant Orders   AMB Referral to [place] An individualize plan was formulated for care of COPD.  Treatment is evidence based.  She will continue on inhalers, avoid smoking and smoke.  Regular exercise with help with dyspnea. Routine follow ups and medication compliance is needed.      Digestive   Gastroesophageal reflux disease with esophagitis Plan of care was formulated today.  She is doing well.  A plan of care was formulated using patient exam, tests and other sources to optimize care using evidence based information.  Recommend no smoking, no eating after supper, avoid fatty foods, elevate Head of bed, avoid tight fitting clothing.  Continue on OTC.      Other   Mixed hyperlipidemia   Relevant Orders   Lipid panel AN INDIVIDUAL CARE PLAN for hyperlipidemia/ cholesterol was established and reinforced today.  The patient's status was assessed using clinical findings on exam, lab and other diagnostic tests. The patient's disease status was assessed based on evidence-based guidelines and found to be well controlled. MEDICATIONS were reviewed. SELF MANAGEMENT GOALS have been discussed and patient's success at attaining the goal of low cholesterol was assessed. RECOMMENDATION given include regular exercise 3 days a week and low cholesterol/low fat diet. CLINICAL SUMMARY including written plan to identify barriers unique to the patient due to social or economic  reasons was discussed.     Major depressive disorder, single episode, mild ([place])   Relevant Orders   AMB Referral to [place] Patient's depression is controlled with venlafaxine.   Anhedonia better.  PHQ 9 was 4 score 4. An individual care plan was established or reinforced today.  The  patient's disease status was assessed using clinical findings on exam, labs, and or other diagnostic testing to determine patient's success in meeting treatment goals based on disease specific evidence-based guidelines and found to be improving Recommendations include stay on medicine     Moderate protein-calorie malnutrition ([place])   Relevant Orders   AMB Referral to [facility] Coordinaton Supplement nutrition with protein/calorie supplement with meals to improve nutritional status.     Stress incontinence Patients stress incontinence is well controlled  .    Orders Placed This Encounter  Procedures   Comprehensive metabolic panel   Lipid panel   CBC with Differential/Platelet   AMB Referral to [place]   30 minute visit with review of records  Follow-up: Return in about 6 months (around 05/17/2022) for fastng.  An After Visit Summary was printed and given to the patient.  Reinaldo Meeker, MD [facility] [phone]

## 2021-11-17 ENCOUNTER — Ambulatory Visit (INDEPENDENT_AMBULATORY_CARE_PROVIDER_SITE_OTHER): Payer: Medicare Other | Admitting: Legal Medicine

## 2021-11-17 ENCOUNTER — Encounter: Payer: Self-pay | Admitting: Legal Medicine

## 2021-11-17 ENCOUNTER — Other Ambulatory Visit: Payer: Self-pay

## 2021-11-17 VITALS — BP 110/80 | HR 74 | Temp 98.8°F | Resp 15 | Ht 60.0 in | Wt 118.0 lb

## 2021-11-17 DIAGNOSIS — E782 Mixed hyperlipidemia: Secondary | ICD-10-CM

## 2021-11-17 DIAGNOSIS — K2100 Gastro-esophageal reflux disease with esophagitis, without bleeding: Secondary | ICD-10-CM | POA: Diagnosis not present

## 2021-11-17 DIAGNOSIS — F320 Major depressive disorder, single episode, mild: Secondary | ICD-10-CM | POA: Diagnosis not present

## 2021-11-17 DIAGNOSIS — I10 Essential (primary) hypertension: Secondary | ICD-10-CM

## 2021-11-17 DIAGNOSIS — G4733 Obstructive sleep apnea (adult) (pediatric): Secondary | ICD-10-CM

## 2021-11-17 DIAGNOSIS — I700 Atherosclerosis of aorta: Secondary | ICD-10-CM

## 2021-11-17 DIAGNOSIS — N393 Stress incontinence (female) (male): Secondary | ICD-10-CM

## 2021-11-17 DIAGNOSIS — J449 Chronic obstructive pulmonary disease, unspecified: Secondary | ICD-10-CM

## 2021-11-17 DIAGNOSIS — E440 Moderate protein-calorie malnutrition: Secondary | ICD-10-CM

## 2021-11-19 ENCOUNTER — Telehealth: Payer: Self-pay | Admitting: *Deleted

## 2021-11-19 DIAGNOSIS — J410 Simple chronic bronchitis: Secondary | ICD-10-CM | POA: Diagnosis not present

## 2021-11-19 NOTE — Chronic Care Management (AMB) (Signed)
Chronic Care Management   Note  11/19/2021 Name: Brenda Patrick MRN: [id number] DOB: 09/08/1932  Brenda Patrick is a 88 y.o. year old female who is a primary care patient of Lillard Anes, MD. I reached out to [name] by phone today in response to a referral sent by [name]'s PCP.  [name] was given information about Chronic Care Management services today including:  CCM service includes personalized support from designated clinical staff supervised by her physician, including individualized plan of care and coordination with other care providers 24/7 contact phone numbers for assistance for urgent and routine care needs. Service will only be billed when office clinical staff spend 20 minutes or more in a month to coordinate care. Only one practitioner may furnish and bill the service in a calendar month. The patient may stop CCM services at any time (effective at the end of the month) by phone call to the office staff. The patient is responsible for co-pay (up to 20% after annual deductible is met) if co-pay is required by the individual health plan.   Patient agreed to services and verbal consent obtained.   Follow up plan: Telephone appointment with care management team member scheduled for:11/24/21  [place] [facility]  Direct Dial: [phone]

## 2021-11-24 ENCOUNTER — Ambulatory Visit (INDEPENDENT_AMBULATORY_CARE_PROVIDER_SITE_OTHER): Payer: Medicare Other

## 2021-11-24 VITALS — Ht 60.0 in | Wt 117.0 lb

## 2021-11-24 DIAGNOSIS — E440 Moderate protein-calorie malnutrition: Secondary | ICD-10-CM

## 2021-11-24 DIAGNOSIS — I10 Essential (primary) hypertension: Secondary | ICD-10-CM

## 2021-11-24 DIAGNOSIS — J449 Chronic obstructive pulmonary disease, unspecified: Secondary | ICD-10-CM

## 2021-11-24 DIAGNOSIS — F320 Major depressive disorder, single episode, mild: Secondary | ICD-10-CM

## 2021-11-24 DIAGNOSIS — Z6820 Body mass index (BMI) 20.0-20.9, adult: Secondary | ICD-10-CM

## 2021-11-24 NOTE — Chronic Care Management (AMB) (Deleted)
Chronic Care Management   CCM RN Visit Note  11/24/2021 Name: Brenda Patrick MRN: [id number] DOB: 1932/03/25  Subjective: Brenda Patrick is a 88 y.o. year old female who is a primary care patient of Lillard Anes, MD. The care management team was consulted for assistance with disease management and care coordination needs.    {CCMTELEPHONEFACETOFACE:21091510} for {CCMINITIALFOLLOWUPCHOICE:21091511} in response to provider referral for case management and/or care coordination services.   Consent to Services:  {CCMCONSENTOPTIONS:25074}  Patient agreed to services and verbal consent obtained.   Assessment: Review of patient past medical history, allergies, medications, health status, including review of consultants reports, laboratory and other test data, was performed as part of comprehensive evaluation and provision of chronic care management services.   SDOH (Social Determinants of Health) assessments and interventions performed:  SDOH Interventions    Flowsheet Row Most Recent Value  SDOH Interventions   Food Insecurity Interventions Intervention Not Indicated  Financial Strain Interventions Intervention Not Indicated  Housing Interventions Intervention Not Indicated  Intimate Partner Violence Interventions Intervention Not Indicated  Stress Interventions Intervention Not Indicated  Transportation Interventions Intervention Not Indicated        CCM Care Plan  Allergies  Allergen Reactions   Ceftin [Cefuroxime] Diarrhea   Lisinopril Diarrhea    Outpatient Encounter Medications as of 11/24/2021  Medication Sig   acetaminophen (TYLENOL) 650 MG CR tablet Take 650 mg by mouth 2 (two) times daily.   albuterol (PROVENTIL) (2.5 MG/3ML) 0.083% nebulizer solution Use 1 Vial In Nebulizer Every 6 Hours As Needed For Wheezing or Shortness of Breath   Ascorbic Acid (VITAMIN C) 1000 MG tablet Take 1,000 mg by mouth daily.   atenolol (TENORMIN) 50 MG tablet TAKE 1  TABLET(50 MG) BY MOUTH DAILY   busPIRone (BUSPAR) 10 MG tablet TAKE 1 TABLET(10 MG) BY MOUTH THREE TIMES DAILY   ferrous sulfate 325 (65 FE) MG tablet Take by mouth.   fluticasone (FLONASE) 50 MCG/ACT nasal spray SHAKE LIQUID AND USE 1 SPRAY IN EACH NOSTRIL AT BEDTIME   LORazepam (ATIVAN) 1 MG tablet TAKE 1 TABLET BY MOUTH TWICE DAILY AS NEEDED   losartan-hydrochlorothiazide (HYZAAR) 100-12.5 MG tablet TAKE 1 TABLET BY MOUTH DAILY   Multiple Vitamins-Minerals (MULTIVITAMIN WOMEN 50+ PO) Take 1 tablet by mouth daily.   omeprazole (PRILOSEC) 40 MG capsule Take 1 capsule (40 mg total) by mouth daily.   pravastatin (PRAVACHOL) 40 MG tablet Take 40 mg by mouth daily as needed.   PREMARIN vaginal cream SMARTSIG:1 Vaginal Every Night   venlafaxine XR (EFFEXOR-XR) 37.5 MG 24 hr capsule TAKE 1 CAPSULE(37.5 MG) BY MOUTH DAILY WITH BREAKFAST   zinc gluconate 50 MG tablet Take 50 mg by mouth daily.   doxycycline (VIBRAMYCIN) 100 MG capsule Take 100 mg by mouth 2 (two) times daily. (Patient not taking: Reported on 11/24/2021)   mupirocin cream (BACTROBAN) 2 % Apply 1 application topically 2 (two) times daily. (Patient not taking: Reported on 11/24/2021)   mupirocin ointment (BACTROBAN) 2 % Apply topically 2 (two) times daily. (Patient not taking: Reported on 11/24/2021)   No facility-administered encounter medications on file as of 11/24/2021.    Patient Active Problem List   Diagnosis Date Noted   Atherosclerosis of aorta ([place]) 11/17/2021   Stress incontinence 11/25/2020   Gastroesophageal reflux disease with esophagitis 05/26/2020   Mixed hyperlipidemia 05/26/2020   Major depressive disorder, single episode, mild ([place]) 05/26/2020   OSA (obstructive sleep apnea) 05/26/2020   COPD (chronic obstructive pulmonary disease) ([place]) 05/26/2020  Moderate protein-calorie malnutrition ([place]) 05/26/2020   BMI 20.0-20.9, adult 05/26/2020   Essential hypertension 10/30/2019    Conditions to be  addressed/monitored:{CCM ASSESSMENT DZ OPTIONS:25047}  Care Plan : [place] of Care  Updates made by Thana Ates, RN since 11/24/2021 12:00 AM     Problem: No plan of care established for managment of chronic disease states ( COPD, Depression, Hypertension, protein calorie malnutrition)   Priority: High     Goal: Developement of plan of care for Chronic Diesase states ( COPD, Depression, hypertension and protein calorie malnutrition)   Start Date: 11/24/2021  Expected End Date: 11/24/2022  Priority: High  Note:   Current Barriers:  Chronic Disease Management support and education needs related to HTN, COPD, Depression: depressed mood, and protein calorie malnutrition 11/24/2021  Patient reports that she manages her blood pressure well with diet and medications. Reports she self monitors BP.  COPD- Patient reports that she wears oxygen at 2 liters at night. Uses her nebulizer twice a day. Denies shortness of breath. Is able to do all her ADLS and IADLS independently.  Depression-Reports her depression is under good control. States that she misses her husband who died with covid.  Reports she has had a long battle with depression but manages it well.  Takes her medications as prescribed. Continues to drive herself anywhere she wants to go. Has good support with her Niece. Protein Calorie Malnutrition-  Patient reports she has tried ensure and boost and these supplements cause loose stools.  Reports he goals is not to get above 120 pounds.   Currently report fullness in her right ear since having the flu. Patient finds this very bothersome.  RNCM Clinical Goal(s):  Patient will demonstrate ongoing adherence to prescribed treatment plan for HTN, COPD, Depression, and protein calorie malnutrition as evidenced by patient report.  through collaboration with Consulting civil engineer, provider, and care team.   Interventions: 1:1 collaboration with primary care provider regarding development and  update of comprehensive plan of care as evidenced by provider attestation and co-signature Inter-disciplinary care team collaboration (see longitudinal plan of care) Evaluation of current treatment plan related to  self management and patient's adherence to plan as established by provider   COPD: (Status: New goal.) Long Term Goal  Reviewed medications with patient, including use of prescribed maintenance and rescue inhalers, and provided instruction on medication management and the importance of adherence Provided instruction about proper use of medications used for management of COPD including inhalers Advised patient to self assesses COPD action plan zone and make appointment with provider if in the yellow zone for 48 hours without improvement Mailed COPD zones to patient to review. In basket message sent to MD about fullness in the right ear.   Protein Calorie Malnutrition  (Status: New goal.) Long Term Goal  Evaluation of current treatment plan related to  protein calorie malnutrition , self-management and patient's adherence to plan as established by provider. Discussed plans with patient for ongoing care management follow up and provided patient with direct contact information for care management team Advised patient to increase her protein intake, reviewed nutritional supplements and increase consumption of meat, cheese and eggs; Reviewed medications with patient and discussed importance of taking all medications as prescribed; Reviewed scheduled/upcoming provider appointments including PCP and this case manager; Assessed social determinant of health barriers;   Hypertension: (Status: New goal.) Long Term Goal  Last practice recorded BP readings:  BP Readings from Last 3 Encounters:  11/17/21 110/80  08/11/21 110/60  06/16/21 140/60  Most recent eGFR/CrCl:  Lab Results  Component Value Date   EGFR 59 (L) 05/26/2021    No components found for: CRCL  Evaluation of current  treatment plan related to hypertension self management and patient's adherence to plan as established by provider;   Reviewed medications with patient and discussed importance of compliance;  Discussed plans with patient for ongoing care management follow up and provided patient with direct contact information for care management team; Reviewed scheduled/upcoming provider appointments including: PCP and this case manager Assessed social determinant of health barriers;    Depression  (Status:  New goal.)  Long Term Goal Evaluation of current treatment plan related to Depression, self-management and patient's adherence to plan as established by provider. Discussed plans with patient for ongoing care management follow up and provided patient with direct contact information for care management team Reviewed medications with patient and discussed importance of taking all medications as prescribed. Screening for signs and symptoms of depression related to chronic disease state  Assessed social determinant of health barriers  Patient Goals/Self-Care Activities: Take medications as prescribed   Perform all self care activities independently  Perform IADL's (shopping, preparing meals, housekeeping, managing finances) independently Call provider office for new concerns or questions  Increase protein supplements. Increase eating more meat, cheese and eggs. Continue to use your oxygen at night.           Plan:{CM FOLLOW UP PLAN:25073} SIG***

## 2021-11-24 NOTE — Chronic Care Management (AMB) (Signed)
Chronic Care Management   CCM RN Visit Note  11/24/2021 Name: Brenda Patrick MRN: [id number] DOB: 10-23-1932  Subjective: Brenda Patrick is a 88 y.o. year old female who is a primary care patient of Lillard Anes, MD. The care management team was consulted for assistance with disease management and care coordination needs.    Engaged with patient by telephone for initial visit in response to provider referral for case management and/or care coordination services.   Consent to Services:  The patient was given the following information about Chronic Care Management services today, agreed to services, and gave verbal consent: 1. CCM service includes personalized support from designated clinical staff supervised by the primary care provider, including individualized plan of care and coordination with other care providers 2. 24/7 contact phone numbers for assistance for urgent and routine care needs. 3. Service will only be billed when office clinical staff spend 20 minutes or more in a month to coordinate care. 4. Only one practitioner may furnish and bill the service in a calendar month. 5.The patient may stop CCM services at any time (effective at the end of the month) by phone call to the office staff. 6. The patient will be responsible for cost sharing (co-pay) of up to 20% of the service fee (after annual deductible is met). Patient agreed to services and consent obtained.  Patient agreed to services and verbal consent obtained.   Assessment: Review of patient past medical history, allergies, medications, health status, including review of consultants reports, laboratory and other test data, was performed as part of comprehensive evaluation and provision of chronic care management services.   SDOH (Social Determinants of Health) assessments and interventions performed:  SDOH Interventions    Flowsheet Row Most Recent Value  SDOH Interventions   Food Insecurity Interventions  Intervention Not Indicated  Financial Strain Interventions Intervention Not Indicated  Housing Interventions Intervention Not Indicated  Intimate Partner Violence Interventions Intervention Not Indicated  Stress Interventions Intervention Not Indicated  Transportation Interventions Intervention Not Indicated        CCM Care Plan  Allergies  Allergen Reactions   Ceftin [Cefuroxime] Diarrhea   Lisinopril Diarrhea    Outpatient Encounter Medications as of 11/24/2021  Medication Sig   acetaminophen (TYLENOL) 650 MG CR tablet Take 650 mg by mouth 2 (two) times daily.   albuterol (PROVENTIL) (2.5 MG/3ML) 0.083% nebulizer solution Use 1 Vial In Nebulizer Every 6 Hours As Needed For Wheezing or Shortness of Breath   Ascorbic Acid (VITAMIN C) 1000 MG tablet Take 1,000 mg by mouth daily.   atenolol (TENORMIN) 50 MG tablet TAKE 1 TABLET(50 MG) BY MOUTH DAILY   busPIRone (BUSPAR) 10 MG tablet TAKE 1 TABLET(10 MG) BY MOUTH THREE TIMES DAILY   ferrous sulfate 325 (65 FE) MG tablet Take by mouth.   fluticasone (FLONASE) 50 MCG/ACT nasal spray SHAKE LIQUID AND USE 1 SPRAY IN EACH NOSTRIL AT BEDTIME   LORazepam (ATIVAN) 1 MG tablet TAKE 1 TABLET BY MOUTH TWICE DAILY AS NEEDED   losartan-hydrochlorothiazide (HYZAAR) 100-12.5 MG tablet TAKE 1 TABLET BY MOUTH DAILY   Multiple Vitamins-Minerals (MULTIVITAMIN WOMEN 50+ PO) Take 1 tablet by mouth daily.   omeprazole (PRILOSEC) 40 MG capsule Take 1 capsule (40 mg total) by mouth daily.   pravastatin (PRAVACHOL) 40 MG tablet Take 40 mg by mouth daily as needed.   PREMARIN vaginal cream SMARTSIG:1 Vaginal Every Night   venlafaxine XR (EFFEXOR-XR) 37.5 MG 24 hr capsule TAKE 1 CAPSULE(37.5 MG) BY MOUTH  DAILY WITH BREAKFAST   zinc gluconate 50 MG tablet Take 50 mg by mouth daily.   doxycycline (VIBRAMYCIN) 100 MG capsule Take 100 mg by mouth 2 (two) times daily. (Patient not taking: Reported on 11/24/2021)   mupirocin cream (BACTROBAN) 2 % Apply 1 application  topically 2 (two) times daily. (Patient not taking: Reported on 11/24/2021)   mupirocin ointment (BACTROBAN) 2 % Apply topically 2 (two) times daily. (Patient not taking: Reported on 11/24/2021)   No facility-administered encounter medications on file as of 11/24/2021.    Patient Active Problem List   Diagnosis Date Noted   Atherosclerosis of aorta ([place]) 11/17/2021   Stress incontinence 11/25/2020   Gastroesophageal reflux disease with esophagitis 05/26/2020   Mixed hyperlipidemia 05/26/2020   Major depressive disorder, single episode, mild ([place]) 05/26/2020   OSA (obstructive sleep apnea) 05/26/2020   COPD (chronic obstructive pulmonary disease) ([place]) 05/26/2020   Moderate protein-calorie malnutrition ([place]) 05/26/2020   BMI 20.0-20.9, adult 05/26/2020   Essential hypertension 10/30/2019    Conditions to be addressed/monitored:HTN, COPD, Depression, and protein calorie malnutrition  Care Plan : RN Care Manager Plan of Care  Updates made by Thana Ates, RN since 11/24/2021 12:00 AM     Problem: No plan of care established for managment of chronic disease states ( COPD, Depression, Hypertension, protein calorie malnutrition)   Priority: High     Goal: Developement of plan of care for Chronic Diesase states ( COPD, Depression, hypertension and protein calorie malnutrition)   Start Date: 11/24/2021  Expected End Date: 11/24/2022  Priority: High  Note:   Current Barriers:  Chronic Disease Management support and education needs related to HTN, COPD, Depression: depressed mood, and protein calorie malnutrition 11/24/2021  Patient reports that she manages her blood pressure well with diet and medications. Reports she self monitors BP.  COPD- Patient reports that she wears oxygen at 2 liters at night. Uses her nebulizer twice a day. Denies shortness of breath. Is able to do all her ADLS and IADLS independently.  Depression-Reports her depression is under good control. States that she misses  her husband who died with covid.  Reports she has had a long battle with depression but manages it well.  Takes her medications as prescribed. Continues to drive herself anywhere she wants to go. Has good support with her Niece. Protein Calorie Malnutrition-  Patient reports she has tried ensure and boost and these supplements cause loose stools.  Reports he goals is not to get above 120 pounds.   Currently report fullness in her right ear since having the flu. Patient finds this very bothersome.  RNCM Clinical Goal(s):  Patient will demonstrate ongoing adherence to prescribed treatment plan for HTN, COPD, Depression, and protein calorie malnutrition as evidenced by patient report.  through collaboration with Consulting civil engineer, provider, and care team.   Interventions: 1:1 collaboration with primary care provider regarding development and update of comprehensive plan of care as evidenced by provider attestation and co-signature Inter-disciplinary care team collaboration (see longitudinal plan of care) Evaluation of current treatment plan related to  self management and patient's adherence to plan as established by provider   COPD: (Status: New goal.) Long Term Goal  Reviewed medications with patient, including use of prescribed maintenance and rescue inhalers, and provided instruction on medication management and the importance of adherence Provided instruction about proper use of medications used for management of COPD including inhalers Advised patient to self assesses COPD action plan zone and make appointment with provider if  in the yellow zone for 48 hours without improvement Mailed COPD zones to patient to review. In basket message sent to MD about fullness in the right ear.   Protein Calorie Malnutrition  (Status: New goal.) Long Term Goal  Evaluation of current treatment plan related to  protein calorie malnutrition , self-management and patient's adherence to plan as established by  provider. Discussed plans with patient for ongoing care management follow up and provided patient with direct contact information for care management team Advised patient to increase her protein intake, reviewed nutritional supplements and increase consumption of meat, cheese and eggs; Reviewed medications with patient and discussed importance of taking all medications as prescribed; Reviewed scheduled/upcoming provider appointments including PCP and this case manager; Assessed social determinant of health barriers;   Hypertension: (Status: New goal.) Long Term Goal  Last practice recorded BP readings:  BP Readings from Last 3 Encounters:  11/17/21 110/80  08/11/21 110/60  06/16/21 140/60  Most recent eGFR/CrCl:  Lab Results  Component Value Date   EGFR 59 (L) 05/26/2021    No components found for: CRCL  Evaluation of current treatment plan related to hypertension self management and patient's adherence to plan as established by provider;   Reviewed medications with patient and discussed importance of compliance;  Discussed plans with patient for ongoing care management follow up and provided patient with direct contact information for care management team; Reviewed scheduled/upcoming provider appointments including: PCP and this case manager Assessed social determinant of health barriers;    Depression  (Status:  New goal.)  Long Term Goal Evaluation of current treatment plan related to Depression, self-management and patient's adherence to plan as established by provider. Discussed plans with patient for ongoing care management follow up and provided patient with direct contact information for care management team Reviewed medications with patient and discussed importance of taking all medications as prescribed. Screening for signs and symptoms of depression related to chronic disease state  Assessed social determinant of health barriers  Patient Goals/Self-Care Activities: Take  medications as prescribed   Perform all self care activities independently  Perform IADL's (shopping, preparing meals, housekeeping, managing finances) independently Call provider office for new concerns or questions  Increase protein supplements. Increase eating more meat, cheese and eggs. Continue to use your oxygen at night.           Plan:Telephone follow up appointment with care management team member scheduled for:  01/05/2022 at 10:45am Tomasa Rand RN, BSN, CEN RN Case Manager - [facility] Mobile: [phone]

## 2021-11-24 NOTE — Patient Instructions (Signed)
Visit Information   Thank you for taking time to visit with me today. Please don't hesitate to contact me if I can be of assistance to you before our next scheduled telephone appointment.  Our next appointment is by telephone on 01/05/2022 at 1045  Please call the care guide team at [phone] if you need to cancel or reschedule your appointment.   If you are experiencing a Mental Health or [place] or need someone to talk to, please call the Suicide and Crisis Lifeline: 988 call the [place] National Suicide Prevention Lifeline: 647-050-9581 or TTY: 956-140-4384 TTY 386-386-2912) to talk to a trained counselor call 1-800-273-TALK (toll free, 24 hour hotline) call 911   Following is a copy of your full care plan:  Care Plan : [place] [facility] Care  Updates made by Thana Ates, RN since 11/24/2021 12:00 AM     Problem: No plan of care established for managment of chronic disease states ( COPD, Depression, Hypertension, protein calorie malnutrition)   Priority: High     Goal: Developement of plan of care for Chronic Diesase states ( COPD, Depression, hypertension and protein calorie malnutrition)   Start Date: 11/24/2021  Expected End Date: 11/24/2022  Priority: High  Note:   Current Barriers:  Chronic Disease Management support and education needs related to HTN, COPD, Depression: depressed mood, and protein calorie malnutrition 11/24/2021  Patient reports that she manages her blood pressure well with diet and medications. Reports she self monitors BP.  COPD- Patient reports that she wears oxygen at 2 liters at night. Uses her nebulizer twice a day. Denies shortness of breath. Is able to do all her ADLS and IADLS independently.  Depression-Reports her depression is under good control. States that she misses her husband who died with covid.  Reports she has had a long battle with depression but manages it well.  Takes her medications as prescribed. Continues to drive  herself anywhere she wants to go. Has good support with her Niece. Protein Calorie Malnutrition-  Patient reports she has tried ensure and boost and these supplements cause loose stools.  Reports he goals is not to get above 120 pounds.   Currently report fullness in her right ear since having the flu. Patient finds this very bothersome.  RNCM Clinical Goal(s):  Patient will demonstrate ongoing adherence to prescribed treatment plan for HTN, COPD, Depression, and protein calorie malnutrition as evidenced by patient report.  through collaboration with Consulting civil engineer, provider, and care team.   Interventions: 1:1 collaboration with primary care provider regarding development and update of comprehensive plan of care as evidenced by provider attestation and co-signature Inter-disciplinary care team collaboration (see longitudinal plan of care) Evaluation of current treatment plan related to  self management and patient's adherence to plan as established by provider   COPD: (Status: New goal.) Long Term Goal  Reviewed medications with patient, including use of prescribed maintenance and rescue inhalers, and provided instruction on medication management and the importance of adherence Provided instruction about proper use of medications used for management of COPD including inhalers Advised patient to self assesses COPD action plan zone and make appointment with provider if in the yellow zone for 48 hours without improvement Mailed COPD zones to patient to review. In basket message sent to MD about fullness in the right ear.   Protein Calorie Malnutrition  (Status: New goal.) Long Term Goal  Evaluation of current treatment plan related to  protein calorie malnutrition , self-management and  patient's adherence to plan as established by provider. Discussed plans with patient for ongoing care management follow up and provided patient with direct contact information for care management team Advised  patient to increase her protein intake, reviewed nutritional supplements and increase consumption of meat, cheese and eggs; Reviewed medications with patient and discussed importance of taking all medications as prescribed; Reviewed scheduled/upcoming provider appointments including PCP and this case manager; Assessed social determinant of health barriers;   Hypertension: (Status: New goal.) Long Term Goal  Last practice recorded BP readings:  BP Readings from Last 3 Encounters:  11/17/21 110/80  08/11/21 110/60  06/16/21 140/60  Most recent eGFR/CrCl:  Lab Results  Component Value Date   EGFR 59 (L) 05/26/2021    No components found for: CRCL  Evaluation of current treatment plan related to hypertension self management and patient's adherence to plan as established by provider;   Reviewed medications with patient and discussed importance of compliance;  Discussed plans with patient for ongoing care management follow up and provided patient with direct contact information for care management team; Reviewed scheduled/upcoming provider appointments including: PCP and this case manager Assessed social determinant of health barriers;    Depression  (Status:  New goal.)  Long Term Goal Evaluation of current treatment plan related to Depression, self-management and patient's adherence to plan as established by provider. Discussed plans with patient for ongoing care management follow up and provided patient with direct contact information for care management team Reviewed medications with patient and discussed importance of taking all medications as prescribed. Screening for signs and symptoms of depression related to chronic disease state  Assessed social determinant of health barriers  Patient Goals/Self-Care Activities: Take medications as prescribed   Perform all self care activities independently  Perform IADL's (shopping, preparing meals, housekeeping, managing finances)  independently Call provider office for new concerns or questions  Increase protein supplements. Increase eating more meat, cheese and eggs. Continue to use your oxygen at night.           Consent to CCM Services: Ms. Gladwin was given information about Chronic Care Management services including:  CCM service includes personalized support from designated clinical staff supervised by her physician, including individualized plan of care and coordination with other care providers 24/7 contact phone numbers for assistance for urgent and routine care needs. Service will only be billed when office clinical staff spend 20 minutes or more in a month to coordinate care. Only one practitioner may furnish and bill the service in a calendar month. The patient may stop CCM services at any time (effective at the end of the month) by phone call to the office staff. The patient will be responsible for cost sharing (co-pay) of up to 20% of the service fee (after annual deductible is met).  Patient agreed to services and verbal consent obtained.   The patient verbalized understanding of instructions, educational materials, and care plan provided today and agreed to receive a mailed copy of patient instructions, educational materials, and care plan.   Telephone follow up appointment with care management team member scheduled for:01/05/2022  at 1045 am  Tomasa Rand RN, BSN, [place] RN Case Manager - [facility] Mobile: [phone]

## 2021-11-26 ENCOUNTER — Ambulatory Visit (INDEPENDENT_AMBULATORY_CARE_PROVIDER_SITE_OTHER): Payer: Medicare Other | Admitting: Nurse Practitioner

## 2021-11-26 ENCOUNTER — Encounter: Payer: Self-pay | Admitting: Nurse Practitioner

## 2021-11-26 ENCOUNTER — Other Ambulatory Visit: Payer: Self-pay

## 2021-11-26 VITALS — BP 122/62 | HR 70 | Temp 97.2°F | Ht 60.0 in | Wt 120.0 lb

## 2021-11-26 DIAGNOSIS — H6121 Impacted cerumen, right ear: Secondary | ICD-10-CM

## 2021-11-26 DIAGNOSIS — H6501 Acute serous otitis media, right ear: Secondary | ICD-10-CM

## 2021-11-26 DIAGNOSIS — H6983 Other specified disorders of Eustachian tube, bilateral: Secondary | ICD-10-CM | POA: Diagnosis not present

## 2021-11-26 DIAGNOSIS — H9203 Otalgia, bilateral: Secondary | ICD-10-CM | POA: Diagnosis not present

## 2021-11-26 MED ORDER — TRIAMCINOLONE ACETONIDE 40 MG/ML IJ SUSP
40.0000 mg | Freq: Once | INTRAMUSCULAR | Status: AC
  Administered 2021-11-26: 40 mg via INTRAMUSCULAR

## 2021-11-26 MED ORDER — OFLOXACIN 0.3 % OT SOLN: 5.0000 [drp] | Freq: Two times a day (BID) | OTIC | 0 refills | Status: DC

## 2021-11-26 NOTE — Progress Notes (Signed)
Acute Office Visit  Subjective:    Patient ID: Brenda Patrick, female    DOB: 08/09/1932, 88 y.o.   MRN: [id number]  Chief Complaint  Patient presents with   Ear pressure    HPI:  Ear Pressure: Patient presents with bilateral ear pain and  ear pressure, right worse than left . Symptoms began a few days ago and are unchanged since that time.Treatments include "washing out ears in the shower" She has a past history of chronic allergic rhinitis that is treated with Allegra and Flonase nasal spray. URI December 17th, 2022 treated at Urgent Care, treated with Z-pack. Recently completed a course of Doxycycline.    Past Medical History:  Diagnosis Date   Acute bronchitis due to other specified organisms 04/25/2020   COPD (chronic obstructive pulmonary disease) (HCC) 05/26/2020   COVID-19 10/30/2019   Gastroesophageal reflux disease with esophagitis 05/26/2020   Hypertension    Major depressive disorder, single episode, mild (HCC) 05/26/2020   Mixed hyperlipidemia 05/26/2020   Moderate protein-calorie malnutrition (HCC) 05/26/2020   OSA (obstructive sleep apnea) 05/26/2020    Past Surgical History:  Procedure Laterality Date   ABDOMINAL HYSTERECTOMY     DUODENAL DIVERTICULECTOMY  04/09/1993   OVARIAN CYST REMOVAL      No family history on file.  Social History   Socioeconomic History   Marital status: Widowed    Spouse name: Not on file   Number of children: 0   Years of education: 51   Highest education level: Not on file  Occupational History   Occupation: Homemaker  Tobacco Use   Smoking status: Never   Smokeless tobacco: Never  Vaping Use   Vaping Use: Never used  Substance and Sexual Activity   Alcohol use: Never   Drug use: Never   Sexual activity: Not Currently  Other Topics Concern   Not on file  Social History Narrative   [employer]  44 years   Social Determinants of Health   Financial Resource Strain: Low Risk    Difficulty of Paying Living  Expenses: Not hard at all  Food Insecurity: No Food Insecurity   Worried About Programme researcher, broadcasting/film/video in the Last Year: Never true   Barista in the Last Year: Never true  Transportation Needs: No Transportation Needs   Lack of Transportation (Medical): No   Lack of Transportation (Non-Medical): No  Physical Activity: Not on file  Stress: No Stress Concern Present   Feeling of Stress : Not at all  Social Connections: Moderately Integrated   Frequency of Communication with Friends and Family: Three times a week   Frequency of Social Gatherings with Friends and Family: Once a week   Attends Religious Services: More than 4 times per year   Active Member of [employer] or Organizations: Yes   Attends Banker Meetings: 1 to 4 times per year   Marital Status: Widowed  Catering manager Violence: Not At Risk   Fear of Current or Ex-Partner: No   Emotionally Abused: No   Physically Abused: No   Sexually Abused: No    Outpatient Medications Prior to Visit  Medication Sig Dispense Refill   acetaminophen (TYLENOL) 650 MG CR tablet Take 650 mg by mouth 2 (two) times daily.     albuterol (PROVENTIL) (2.5 MG/3ML) 0.083% nebulizer solution Use 1 Vial In Nebulizer Every 6 Hours As Needed For Wheezing or Shortness of Breath 90 mL 11   Ascorbic Acid (VITAMIN C) 1000 MG tablet  Take 1,000 mg by mouth daily.     atenolol (TENORMIN) 50 MG tablet TAKE 1 TABLET(50 MG) BY MOUTH DAILY 90 tablet 2   busPIRone (BUSPAR) 10 MG tablet TAKE 1 TABLET(10 MG) BY MOUTH THREE TIMES DAILY 90 tablet 2   doxycycline (VIBRAMYCIN) 100 MG capsule Take 100 mg by mouth 2 (two) times daily. (Patient not taking: Reported on 11/24/2021)     ferrous sulfate 325 (65 FE) MG tablet Take by mouth.     fluticasone (FLONASE) 50 MCG/ACT nasal spray SHAKE LIQUID AND USE 1 SPRAY IN EACH NOSTRIL AT BEDTIME 16 g 6   LORazepam (ATIVAN) 1 MG tablet TAKE 1 TABLET BY MOUTH TWICE DAILY AS NEEDED 60 tablet 3    losartan-hydrochlorothiazide (HYZAAR) 100-12.5 MG tablet TAKE 1 TABLET BY MOUTH DAILY 90 tablet 1   Multiple Vitamins-Minerals (MULTIVITAMIN WOMEN 50+ PO) Take 1 tablet by mouth daily.     mupirocin cream (BACTROBAN) 2 % Apply 1 application topically 2 (two) times daily. (Patient not taking: Reported on 11/24/2021) 15 g 2   mupirocin ointment (BACTROBAN) 2 % Apply topically 2 (two) times daily. (Patient not taking: Reported on 11/24/2021)     omeprazole (PRILOSEC) 40 MG capsule Take 1 capsule (40 mg total) by mouth daily. 90 capsule 2   pravastatin (PRAVACHOL) 40 MG tablet Take 40 mg by mouth daily as needed.     PREMARIN vaginal cream SMARTSIG:1 Vaginal Every Night     venlafaxine XR (EFFEXOR-XR) 37.5 MG 24 hr capsule TAKE 1 CAPSULE(37.5 MG) BY MOUTH DAILY WITH BREAKFAST 90 capsule 0   zinc gluconate 50 MG tablet Take 50 mg by mouth daily.     No facility-administered medications prior to visit.    Allergies  Allergen Reactions   Ceftin [Cefuroxime] Diarrhea   Lisinopril Diarrhea    Review of Systems  Constitutional:  Negative for chills, fatigue and fever.  HENT:  Positive for ear pain (Pressure R>L). Negative for congestion, postnasal drip, rhinorrhea, sinus pressure, sinus pain and sore throat.   Respiratory:  Negative for cough and shortness of breath.   Cardiovascular:  Negative for chest pain.  Gastrointestinal:  Negative for diarrhea and nausea.  Neurological:  Negative for dizziness and headaches.      Objective:    Physical Exam Vitals reviewed.  Constitutional:      Appearance: Normal appearance.  HENT:     Right Ear: There is impacted cerumen.     Left Ear: Tympanic membrane normal.     Nose: No congestion or rhinorrhea.     Mouth/Throat:     Pharynx: No posterior oropharyngeal erythema.  Cardiovascular:     Rate and Rhythm: Normal rate and regular rhythm.     Pulses: Normal pulses.     Heart sounds: Normal heart sounds.  Pulmonary:     Breath sounds: Normal  breath sounds.  Abdominal:     General: Bowel sounds are normal.     Palpations: Abdomen is soft.  Skin:    General: Skin is warm and dry.     Capillary Refill: Capillary refill takes less than 2 seconds.  Neurological:     General: No focal deficit present.     Mental Status: She is alert and oriented to person, place, and time.  Psychiatric:        Mood and Affect: Mood normal.        Behavior: Behavior normal.    BP 122/62    Pulse 70    Temp (!) 97.2  F (36.2 C)    Ht 5' (1.524 m)    Wt 120 lb (54.4 kg)    SpO2 92%    BMI 23.44 kg/m  Wt Readings from Last 3 Encounters:  11/26/21 120 lb (54.4 kg)  11/24/21 117 lb (53.1 kg)  11/17/21 118 lb (53.5 kg)    Health Maintenance Due  Topic Date Due   TETANUS/TDAP  Never done   Zoster Vaccines- Shingrix (1 of 2) Never done   DEXA SCAN  Never done   COVID-19 Vaccine (3 - Booster for Pfizer series) 02/16/2020       Lab Results  Component Value Date   TSH 3.260 05/26/2021   Lab Results  Component Value Date   WBC 6.9 05/26/2021   HGB 13.1 05/26/2021   HCT 39.3 05/26/2021   MCV 99 (H) 05/26/2021   PLT 260 05/26/2021   Lab Results  Component Value Date   NA 139 05/26/2021   K 4.4 05/26/2021   CO2 26 05/26/2021   GLUCOSE 95 05/26/2021   BUN 18 05/26/2021   CREATININE 0.94 05/26/2021   BILITOT 0.7 05/26/2021   ALKPHOS 66 05/26/2021   AST 18 05/26/2021   ALT 15 05/26/2021   PROT 6.8 05/26/2021   ALBUMIN 4.6 05/26/2021   CALCIUM 10.0 05/26/2021   ANIONGAP 10 11/04/2019   EGFR 59 (L) 05/26/2021   Lab Results  Component Value Date   CHOL 207 (H) 05/26/2021   Lab Results  Component Value Date   HDL 68 05/26/2021   Lab Results  Component Value Date   LDLCALC 125 (H) 05/26/2021   Lab Results  Component Value Date   TRIG 80 05/26/2021   Lab Results  Component Value Date   CHOLHDL 3.0 05/26/2021        Assessment & Plan:    1. Dysfunction of both eustachian tubes - triamcinolone acetonide  (KENALOG-40) injection 40 mg  2. Acute otalgia, bilateral -Tylenol as directed  3. Right ear impacted cerumen - Ear wax removal  4. Non-recurrent acute serous otitis media of right ear - ofloxacin (FLOXIN OTIC) 0.3 % OTIC solution; Place 5 drops into the right ear 2 (two) times daily.  Dispense: 5 mL; Refill: 0   Continue Flonase nasal spray and Allegra daily Kenalog injection given in office Ofloxacin drops to right ear twice daily for 5 days May use Debrox drops to ears as needed to prevent ear wax build up     Follow-up: PRN  An After Visit Summary was printed and given to the patient.  I, Rip Harbour, NP, have reviewed all documentation for this visit. The documentation on 11/26/21 for the exam, diagnosis, procedures, and orders are all accurate and complete.    Signed, Rip Harbour, NP [place] [phone]

## 2021-11-26 NOTE — Patient Instructions (Addendum)
Continue Flonase nasal spray and Allegra daily Kenalog injection given in office Ofloxacin drops to right ear twice daily for 5 days May use Debrox drops to ears as needed to prevent ear wax build up   Earwax Buildup, Adult The ears produce a substance called earwax that helps keep bacteria out of the ear and protects the skin in the ear canal. Occasionally, earwax can build up in the ear and cause discomfort or hearing loss. What are the causes? This condition is caused by a buildup of earwax. Ear canals are self-cleaning. Ear wax is made in the outer part of the ear canal and generally falls out in small amounts over time. When the self-cleaning mechanism is not working, earwax builds up and can cause decreased hearing and discomfort. Attempting to clean ears with cotton swabs can push the earwax deep into the ear canal and cause decreased hearing and pain. What increases the risk? This condition is more likely to develop in people who: Clean their ears often with cotton swabs. Pick at their ears. Use earplugs or in-ear headphones often, or wear hearing aids. The following factors may also make you more likely to develop this condition: Being female. Being of older age. Naturally producing more earwax. Having narrow ear canals. Having earwax that is overly thick or sticky. Having excess hair in the ear canal. Having eczema. Being dehydrated. What are the signs or symptoms? Symptoms of this condition include: Reduced or muffled hearing. A feeling of fullness in the ear or feeling that the ear is plugged. Fluid coming from the ear. Ear pain or an itchy ear. Ringing in the ear. Coughing. Balance problems. An obvious piece of earwax that can be seen inside the ear canal. How is this diagnosed? This condition may be diagnosed based on: Your symptoms. Your medical history. An ear exam. During the exam, your health care provider will look into your ear with an instrument called an  otoscope. You may have tests, including a hearing test. How is this treated? This condition may be treated by: Using ear drops to soften the earwax. Having the earwax removed by a health care provider. The health care provider may: Flush the ear with water. Use an instrument that has a loop on the end (curette). Use a suction device. Having surgery to remove the wax buildup. This may be done in severe cases. Follow these instructions at home:  Take over-the-counter and prescription medicines only as told by your health care provider. Do not put any objects, including cotton swabs, into your ear. You can clean the opening of your ear canal with a washcloth or facial tissue. Follow instructions from your health care provider about cleaning your ears. Do not overclean your ears. Drink enough fluid to keep your urine pale yellow. This will help to thin the earwax. Keep all follow-up visits as told. If earwax builds up in your ears often or if you use hearing aids, consider seeing your health care provider for routine, preventive ear cleanings. Ask your health care provider how often you should schedule your cleanings. If you have hearing aids, clean them according to instructions from the manufacturer and your health care provider. Contact a health care provider if: You have ear pain. You develop a fever. You have pus or other fluid coming from your ear. You have hearing loss. You have ringing in your ears that does not go away. You feel like the room is spinning (vertigo). Your symptoms do not improve with treatment. Get  help right away if: You have bleeding from the affected ear. You have severe ear pain. Summary Earwax can build up in the ear and cause discomfort or hearing loss. The most common symptoms of this condition include reduced or muffled hearing, a feeling of fullness in the ear, or feeling that the ear is plugged. This condition may be diagnosed based on your symptoms, your  medical history, and an ear exam. This condition may be treated by using ear drops to soften the earwax or by having the earwax removed by a health care provider. Do not put any objects, including cotton swabs, into your ear. You can clean the opening of your ear canal with a washcloth or facial tissue. This information is not intended to replace advice given to you by your health care provider. Make sure you discuss any questions you have with your health care provider. Document Revised: 02/19/2020 Document Reviewed: 02/19/2020 Elsevier Patient Education  [place].    Eustachian Tube Dysfunction Eustachian tube dysfunction refers to a condition in which a blockage develops in the narrow passage that connects the middle ear to the back of the nose (eustachian tube). The eustachian tube regulates air pressure in the middle ear by letting air move between the ear and nose. It also helps to drain fluid from the middle ear space. Eustachian tube dysfunction can affect one or both ears. When the eustachian tube does not function properly, air pressure, fluid, or both can build up in the middle ear. What are the causes? This condition occurs when the eustachian tube becomes blocked or cannot open normally. Common causes of this condition include: Ear infections. Colds and other infections that affect the nose, mouth, and throat (upper respiratory tract). Allergies. Irritation from cigarette smoke. Irritation from stomach acid coming up into the esophagus (gastroesophageal reflux). The esophagus is the part of the body that moves food from the mouth to the stomach. Sudden changes in air pressure, such as from descending in an airplane or scuba diving. Abnormal growths in the nose or throat, such as: Growths that line the nose (nasal polyps). Abnormal growth of cells (tumors). Enlarged tissue at the back of the throat (adenoids). What increases the risk? You are more likely to develop this  condition if: You smoke. You are overweight. You are a child who has: Certain birth defects of the mouth, such as cleft palate. Large tonsils or adenoids. What are the signs or symptoms? Common symptoms of this condition include: A feeling of fullness in the ear. Ear pain. Clicking or popping noises in the ear. Ringing in the ear (tinnitus). Hearing loss. Loss of balance. Dizziness. Symptoms may get worse when the air pressure around you changes, such as when you travel to an area of high elevation, fly on an airplane, or go scuba diving. How is this diagnosed? This condition may be diagnosed based on: Your symptoms. A physical exam of your ears, nose, and throat. Tests, such as those that measure: The movement of your eardrum. Your hearing (audiometry). How is this treated? Treatment depends on the cause and severity of your condition. In mild cases, you may relieve your symptoms by moving air into your ears. This is called "popping the ears." In more severe cases, or if you have symptoms of fluid in your ears, treatment may include: Medicines to relieve congestion (decongestants). Medicines that treat allergies (antihistamines). Nasal sprays or ear drops that contain medicines that reduce swelling (steroids). A procedure to drain the fluid in  your eardrum. In this procedure, a small tube may be placed in the eardrum to: Drain the fluid. Restore the air in the middle ear space. A procedure to insert a balloon device through the nose to inflate the opening of the eustachian tube (balloon dilation). Follow these instructions at home: Lifestyle Do not do any of the following until your health care provider approves: Travel to high altitudes. Fly in airplanes. Work in a Pension scheme manager or room. Scuba dive. Do not use any products that contain nicotine or tobacco. These products include cigarettes, chewing tobacco, and vaping devices, such as e-cigarettes. If you need help  quitting, ask your health care provider. Keep your ears dry. Wear fitted earplugs during showering and bathing. Dry your ears completely after. General instructions Take over-the-counter and prescription medicines only as told by your health care provider. Use techniques to help pop your ears as recommended by your health care provider. These may include: Chewing gum. Yawning. Frequent, forceful swallowing. Closing your mouth, holding your nose closed, and gently blowing as if you are trying to blow air out of your nose. Keep all follow-up visits. This is important. Contact a health care provider if: Your symptoms do not go away after treatment. Your symptoms come back after treatment. You are unable to pop your ears. You have: A fever. Pain in your ear. Pain in your head or neck. Fluid draining from your ear. Your hearing suddenly changes. You become very dizzy. You lose your balance. Get help right away if: You have a sudden, severe increase in any of your symptoms. Summary Eustachian tube dysfunction refers to a condition in which a blockage develops in the eustachian tube. It can be caused by ear infections, allergies, inhaled irritants, or abnormal growths in the nose or throat. Symptoms may include ear pain or fullness, hearing loss, or ringing in the ears. Mild cases are treated with techniques to unblock the ears, such as yawning or chewing gum. More severe cases are treated with medicines or procedures. This information is not intended to replace advice given to you by your health care provider. Make sure you discuss any questions you have with your health care provider. Document Revised: 01/12/2021 Document Reviewed: 01/12/2021 Elsevier Patient Education  2022 [employer].

## 2021-12-01 ENCOUNTER — Other Ambulatory Visit: Payer: Self-pay | Admitting: Legal Medicine

## 2021-12-01 DIAGNOSIS — K2100 Gastro-esophageal reflux disease with esophagitis, without bleeding: Secondary | ICD-10-CM

## 2021-12-10 ENCOUNTER — Other Ambulatory Visit: Payer: Self-pay | Admitting: Legal Medicine

## 2021-12-15 DIAGNOSIS — F320 Major depressive disorder, single episode, mild: Secondary | ICD-10-CM | POA: Diagnosis not present

## 2021-12-15 DIAGNOSIS — J449 Chronic obstructive pulmonary disease, unspecified: Secondary | ICD-10-CM | POA: Diagnosis not present

## 2021-12-15 DIAGNOSIS — I10 Essential (primary) hypertension: Secondary | ICD-10-CM | POA: Diagnosis not present

## 2021-12-16 DIAGNOSIS — N329 Bladder disorder, unspecified: Secondary | ICD-10-CM | POA: Diagnosis not present

## 2021-12-17 ENCOUNTER — Other Ambulatory Visit: Payer: Self-pay | Admitting: Family Medicine

## 2021-12-17 DIAGNOSIS — F320 Major depressive disorder, single episode, mild: Secondary | ICD-10-CM

## 2021-12-17 NOTE — Telephone Encounter (Signed)
Refill sent to pharmacy.   

## 2021-12-18 DIAGNOSIS — N281 Cyst of kidney, acquired: Secondary | ICD-10-CM | POA: Diagnosis not present

## 2021-12-18 DIAGNOSIS — K5730 Diverticulosis of large intestine without perforation or abscess without bleeding: Secondary | ICD-10-CM | POA: Diagnosis not present

## 2021-12-18 DIAGNOSIS — K449 Diaphragmatic hernia without obstruction or gangrene: Secondary | ICD-10-CM | POA: Diagnosis not present

## 2021-12-18 DIAGNOSIS — I700 Atherosclerosis of aorta: Secondary | ICD-10-CM | POA: Diagnosis not present

## 2021-12-18 DIAGNOSIS — N200 Calculus of kidney: Secondary | ICD-10-CM | POA: Diagnosis not present

## 2021-12-18 DIAGNOSIS — J449 Chronic obstructive pulmonary disease, unspecified: Secondary | ICD-10-CM | POA: Diagnosis not present

## 2021-12-18 DIAGNOSIS — N3289 Other specified disorders of bladder: Secondary | ICD-10-CM | POA: Diagnosis not present

## 2021-12-23 ENCOUNTER — Other Ambulatory Visit: Payer: Self-pay | Admitting: Legal Medicine

## 2021-12-23 DIAGNOSIS — F320 Major depressive disorder, single episode, mild: Secondary | ICD-10-CM

## 2022-01-05 ENCOUNTER — Ambulatory Visit (INDEPENDENT_AMBULATORY_CARE_PROVIDER_SITE_OTHER): Payer: Medicare Other

## 2022-01-05 VITALS — Wt 118.0 lb

## 2022-01-05 DIAGNOSIS — I10 Essential (primary) hypertension: Secondary | ICD-10-CM

## 2022-01-05 DIAGNOSIS — J449 Chronic obstructive pulmonary disease, unspecified: Secondary | ICD-10-CM

## 2022-01-05 DIAGNOSIS — F320 Major depressive disorder, single episode, mild: Secondary | ICD-10-CM

## 2022-01-05 NOTE — Chronic Care Management (AMB) (Signed)
Chronic Care Management   CCM RN Visit Note  01/05/2022 Name: Brenda Patrick MRN: [id number] DOB: [birth year]  Subjective: Brenda Patrick is a 88 y.o. year old female who is a primary care patient of Lillard Anes, MD. The care management team was consulted for assistance with disease management and care coordination needs.    Engaged with patient by telephone for follow up visit in response to provider referral for case management and/or care coordination services.   Consent to Services:  The patient was given information about Chronic Care Management services, agreed to services, and gave verbal consent prior to initiation of services.  Please see initial visit note for detailed documentation.   Patient agreed to services and verbal consent obtained.   Assessment: Review of patient past medical history, allergies, medications, health status, including review of consultants reports, laboratory and other test data, was performed as part of comprehensive evaluation and provision of chronic care management services.   SDOH (Social Determinants of Health) assessments and interventions performed:    CCM Care Plan  Allergies  Allergen Reactions   Ceftin [Cefuroxime] Diarrhea   Lisinopril Diarrhea    Outpatient Encounter Medications as of 01/05/2022  Medication Sig   acetaminophen (TYLENOL) 650 MG CR tablet Take 650 mg by mouth 2 (two) times daily.   albuterol (PROVENTIL) (2.5 MG/3ML) 0.083% nebulizer solution Use 1 Vial In Nebulizer Every 6 Hours As Needed For Wheezing or Shortness of Breath   Ascorbic Acid (VITAMIN C) 1000 MG tablet Take 1,000 mg by mouth daily.   atenolol (TENORMIN) 50 MG tablet TAKE 1 TABLET(50 MG) BY MOUTH DAILY   busPIRone (BUSPAR) 10 MG tablet TAKE 1 TABLET(10 MG) BY MOUTH THREE TIMES DAILY   doxycycline (VIBRAMYCIN) 100 MG capsule Take 100 mg by mouth 2 (two) times daily. (Patient not taking: Reported on 11/24/2021)   ferrous sulfate 325 (65 FE) MG  tablet Take by mouth.   fluticasone (FLONASE) 50 MCG/ACT nasal spray SHAKE LIQUID AND USE 1 SPRAY IN EACH NOSTRIL AT BEDTIME   LORazepam (ATIVAN) 1 MG tablet TAKE 1 TABLET BY MOUTH TWICE DAILY AS NEEDED   losartan-hydrochlorothiazide (HYZAAR) 100-12.5 MG tablet TAKE 1 TABLET BY MOUTH DAILY   Multiple Vitamins-Minerals (MULTIVITAMIN WOMEN 50+ PO) Take 1 tablet by mouth daily.   mupirocin cream (BACTROBAN) 2 % Apply 1 application topically 2 (two) times daily. (Patient not taking: Reported on 11/24/2021)   mupirocin ointment (BACTROBAN) 2 % Apply topically 2 (two) times daily. (Patient not taking: Reported on 11/24/2021)   ofloxacin (FLOXIN OTIC) 0.3 % OTIC solution Place 5 drops into the right ear 2 (two) times daily.   omeprazole (PRILOSEC) 40 MG capsule TAKE 1 CAPSULE(40 MG) BY MOUTH DAILY   pravastatin (PRAVACHOL) 40 MG tablet Take 40 mg by mouth daily as needed.   PREMARIN vaginal cream SMARTSIG:1 Vaginal Every Night   venlafaxine XR (EFFEXOR-XR) 37.5 MG 24 hr capsule TAKE 1 CAPSULE(37.5 MG) BY MOUTH DAILY WITH BREAKFAST   zinc gluconate 50 MG tablet Take 50 mg by mouth daily.   No facility-administered encounter medications on file as of 01/05/2022.    Patient Active Problem List   Diagnosis Date Noted   Atherosclerosis of aorta ([place]) 11/17/2021   Stress incontinence 11/25/2020   Gastroesophageal reflux disease with esophagitis 05/26/2020   Mixed hyperlipidemia 05/26/2020   Major depressive disorder, single episode, mild ([place]) 05/26/2020   OSA (obstructive sleep apnea) 05/26/2020   COPD (chronic obstructive pulmonary disease) ([place]) 05/26/2020   Moderate protein-calorie  malnutrition ([place]) 05/26/2020   BMI 20.0-20.9, adult 05/26/2020   Essential hypertension 10/30/2019    Conditions to be addressed/monitored:HTN, Depression, and COPD, protein calorie malnutrition  Care Plan : RN Care Manager Plan of Care  Updates made by Thana Ates, RN since 01/05/2022 12:00 AM     Problem: No  plan of care established for managment of chronic disease states ( COPD, Depression, Hypertension, protein calorie malnutrition)   Priority: High     Long-Range Goal: Developement of plan of care for Chronic Diesase states ( COPD, Depression, hypertension and protein calorie malnutrition)   Start Date: 11/24/2021  Expected End Date: 11/24/2022  Priority: High  Note:   Current Barriers:  Chronic Disease Management support and education needs related to HTN, COPD, Depression: depressed mood, and protein calorie malnutrition 11/24/2021  Patient reports that she manages her blood pressure well with diet and medications. Reports she self monitors BP.  COPD- Patient reports that she wears oxygen at 2 liters at night. Uses her nebulizer twice a day. Denies shortness of breath. Is able to do all her ADLS and IADLS independently.  Depression-Reports her depression is under good control. States that she misses her husband who died with covid.  Reports she has had a long battle with depression but manages it well.  Takes her medications as prescribed. Continues to drive herself anywhere she wants to go. Has good support with her Niece. Protein Calorie Malnutrition-  Patient reports she has tried ensure and boost and these supplements cause loose stools.  Reports he goals is not to get above 120 pounds.   Currently report fullness in her right ear since having the flu. Patient finds this very bothersome.   01/05/2022 Hypertension- Patient reports her blood pressure is good. Reports she continues to take her medications as prescribed.  COPD-  Continues to wear her oxygen at night and continues to use her nebulizer twice a day.  Report recent cold and right ear is stopped up again. Reports she washed out her ear last night and scab came out.  Protein calorie malnutrition . Current weight reported of 118 pounds.  Reports her appetite is good.  Depression-  tearful on the phone. Reports she misses her husband.   RNCM  Clinical Goal(s):  Patient will demonstrate ongoing adherence to prescribed treatment plan for HTN, COPD, Depression, and protein calorie malnutrition as evidenced by patient report.  through collaboration with Consulting civil engineer, provider, and care team.   Interventions: 1:1 collaboration with primary care provider regarding development and update of comprehensive plan of care as evidenced by provider attestation and co-signature Inter-disciplinary care team collaboration (see longitudinal plan of care) Evaluation of current treatment plan related to  self management and patient's adherence to plan as established by provider   COPD: (Status: New goal.) Long Term Goal  Reviewed medications with patient, including use of prescribed maintenance and rescue inhalers, and provided instruction on medication management and the importance of adherence Provided instruction about proper use of medications used for management of COPD including inhalers Advised patient to self assesses COPD action plan zone and make appointment with provider if in the yellow zone for 48 hours without improvement Encouraged patient to call MD for changes in condition.  Encouraged patient to continue to use her oxygen and her nebulizer as prescribed.  Protein Calorie Malnutrition  (Status: New goal.) Long Term Goal  Evaluation of current treatment plan related to  protein calorie malnutrition , self-management and patient's adherence to plan as  established by provider. Discussed plans with patient for ongoing care management follow up and provided patient with direct contact information for care management team Advised patient to increase her protein intake, reviewed nutritional supplements and increase consumption of meat, cheese and eggs; Reviewed medications with patient and discussed importance of taking all medications as prescribed; Reviewed scheduled/upcoming provider appointments including PCP and this case  manager;  Hypertension: (Status: New goal.) Long Term Goal  Last practice recorded BP readings:  BP Readings from Last 3 Encounters:  11/26/21 122/62  11/17/21 110/80  08/11/21 110/60   Today's Vitals   01/05/22 1123  Weight: 118 lb (53.5 kg)  PainSc: 0-No pain    Most recent eGFR/CrCl:  Lab Results  Component Value Date   EGFR 59 (L) 05/26/2021    No components found for: CRCL  Evaluation of current treatment plan related to hypertension self management and patient's adherence to plan as established by provider;   Reviewed medications with patient and discussed importance of compliance;  Discussed plans with patient for ongoing care management follow up and provided patient with direct contact information for care management team; Reviewed scheduled/upcoming provider appointments including: PCP and this case manager Assessed social determinant of health barriers;   Depression  (Status:  Goal on track:  Yes.)  Long Term Goal Evaluation of current treatment plan related to Depression, self-management and patient's adherence to plan as established by provider. Discussed plans with patient for ongoing care management follow up and provided patient with direct contact information for care management team Provided a listening ear.   Encouraged patient to go outside and enjoy the sunshine. Encouraged patient to stay busy  Patient Goals/Self-Care Activities: Take medications as prescribed   Perform all self care activities independently  Perform IADL's (shopping, preparing meals, housekeeping, managing finances) independently Call provider office for new concerns or questions  Continue to use your oxygen at night.  Encouraged patient to enjoy the nice weather. Reviewed with patient it is normal to have good and bad days.           Plan:Telephone follow up appointment with care management team member scheduled for:  02/18/2022  at 1:30 pm Tomasa Rand RN, BSN, CEN RN Case  Manager - [facility] Mobile: [phone]

## 2022-01-05 NOTE — Patient Instructions (Addendum)
Visit Information  Thank you for taking time to visit with me today. Please don't hesitate to contact me if I can be of assistance to you before our next scheduled telephone appointment.  Following are the goals we discussed today:  Take medications as prescribed   Perform all self care activities independently  Perform IADL's (shopping, preparing meals, housekeeping, managing finances) independently Call provider office for new concerns or questions  Continue to use your oxygen at night.  Encouraged patient to enjoy the nice weather. Reviewed with patient it is normal to have good and bad days.   Our next appointment is by telephone on 02/18/2022 at 1:30pm  Please call the care guide team at [phone] if you need to cancel or reschedule your appointment.   If you are experiencing a Mental Health or [place] or need someone to talk to, please call the Suicide and Crisis Lifeline: 988 call the [place] National Suicide Prevention Lifeline: 253-374-1534 or TTY: 253-332-4597 TTY 402 205 6417) to talk to a trained counselor call 1-800-273-TALK (toll free, 24 hour hotline) call 911   The patient verbalized understanding of instructions, educational materials, and care plan provided today and agreed to receive a mailed copy of patient instructions, educational materials, and care plan.   Tomasa Rand RN, BSN, CEN RN Case Freight forwarder - [facility] Mobile: [phone]

## 2022-01-12 DIAGNOSIS — J449 Chronic obstructive pulmonary disease, unspecified: Secondary | ICD-10-CM

## 2022-01-12 DIAGNOSIS — I10 Essential (primary) hypertension: Secondary | ICD-10-CM | POA: Diagnosis not present

## 2022-01-12 DIAGNOSIS — F320 Major depressive disorder, single episode, mild: Secondary | ICD-10-CM

## 2022-01-14 DIAGNOSIS — D414 Neoplasm of uncertain behavior of bladder: Secondary | ICD-10-CM | POA: Diagnosis not present

## 2022-01-14 DIAGNOSIS — N3289 Other specified disorders of bladder: Secondary | ICD-10-CM | POA: Diagnosis not present

## 2022-01-14 DIAGNOSIS — J449 Chronic obstructive pulmonary disease, unspecified: Secondary | ICD-10-CM | POA: Diagnosis not present

## 2022-01-14 DIAGNOSIS — I10 Essential (primary) hypertension: Secondary | ICD-10-CM | POA: Diagnosis not present

## 2022-01-14 DIAGNOSIS — C672 Malignant neoplasm of lateral wall of bladder: Secondary | ICD-10-CM | POA: Diagnosis not present

## 2022-01-15 DIAGNOSIS — J449 Chronic obstructive pulmonary disease, unspecified: Secondary | ICD-10-CM | POA: Diagnosis not present

## 2022-01-15 HISTORY — PX: CYSTOSCOPY: SHX5120

## 2022-01-19 ENCOUNTER — Encounter: Payer: Self-pay | Admitting: Legal Medicine

## 2022-01-19 ENCOUNTER — Ambulatory Visit (INDEPENDENT_AMBULATORY_CARE_PROVIDER_SITE_OTHER): Payer: Medicare Other | Admitting: Legal Medicine

## 2022-01-19 VITALS — BP 120/70 | HR 61 | Temp 98.3°F | Resp 15 | Ht 60.0 in | Wt 118.0 lb

## 2022-01-19 DIAGNOSIS — J449 Chronic obstructive pulmonary disease, unspecified: Secondary | ICD-10-CM

## 2022-01-19 DIAGNOSIS — J441 Chronic obstructive pulmonary disease with (acute) exacerbation: Secondary | ICD-10-CM

## 2022-01-19 DIAGNOSIS — R051 Acute cough: Secondary | ICD-10-CM | POA: Diagnosis not present

## 2022-01-19 DIAGNOSIS — Z9189 Other specified personal risk factors, not elsewhere classified: Secondary | ICD-10-CM

## 2022-01-19 DIAGNOSIS — R6889 Other general symptoms and signs: Secondary | ICD-10-CM

## 2022-01-19 LAB — POCT INFLUENZA A/B
Influenza A, POC: NEGATIVE
Influenza B, POC: NEGATIVE

## 2022-01-19 LAB — POC COVID19 BINAXNOW: SARS Coronavirus 2 Ag: NEGATIVE

## 2022-01-19 MED ORDER — AMOXICILLIN-POT CLAVULANATE 875-125 MG PO TABS: 1.0000 | ORAL_TABLET | Freq: Two times a day (BID) | ORAL | 0 refills | Status: DC

## 2022-01-19 MED ORDER — ALBUTEROL SULFATE (2.5 MG/3ML) 0.083% IN NEBU: INHALATION_SOLUTION | RESPIRATORY_TRACT | 11 refills | Status: DC

## 2022-01-19 MED ORDER — PREDNISONE 10 MG (21) PO TBPK: ORAL_TABLET | ORAL | 0 refills | Status: DC

## 2022-01-19 NOTE — Progress Notes (Signed)
Acute Office Visit  Subjective:    Patient ID: Brenda Patrick, female    DOB: December 27, 1931, 88 y.o.   MRN: [id number]  Chief Complaint  Patient presents with   Cough    HPI: Patient is in today for cough. She has been with dry and productive cough for 3 weeks. Post nasal drip, nasal congestion, chest congestion and sinus pressure. She denied fever, chills, sore throat, ear pain, sob, chest pain.phlegm yellow and white.  Using albuterol nebulizer bid.    Past Medical History:  Diagnosis Date   Acute bronchitis due to other specified organisms 04/25/2020   COPD (chronic obstructive pulmonary disease) ([place]) 05/26/2020   COVID-19 10/30/2019   Gastroesophageal reflux disease with esophagitis 05/26/2020   Hypertension    Major depressive disorder, single episode, mild ([place]) 05/26/2020   Mixed hyperlipidemia 05/26/2020   Moderate protein-calorie malnutrition ([place]) 05/26/2020   OSA (obstructive sleep apnea) 05/26/2020    Past Surgical History:  Procedure Laterality Date   ABDOMINAL HYSTERECTOMY     DUODENAL DIVERTICULECTOMY  04/09/1993   OVARIAN CYST REMOVAL      History reviewed. No pertinent family history.  Social History   Socioeconomic History   Marital status: Widowed    Spouse name: Not on file   Number of children: 0   Years of education: 67   Highest education level: Not on file  Occupational History   Occupation: Homemaker  Tobacco Use   Smoking status: Never   Smokeless tobacco: Never  Vaping Use   Vaping Use: Never used  Substance and Sexual Activity   Alcohol use: Never   Drug use: Never   Sexual activity: Not Currently  Other Topics Concern   Not on file  Social History Narrative   [employer]  44 years   Social Determinants of Health   Financial Resource Strain: Low Risk    Difficulty of Paying Living Expenses: Not hard at all  Food Insecurity: No Food Insecurity   Worried About Charity fundraiser in the Last Year: Never true   Arboriculturist  in the Last Year: Never true  Transportation Needs: No Transportation Needs   Lack of Transportation (Medical): No   Lack of Transportation (Non-Medical): No  Physical Activity: Not on file  Stress: No Stress Concern Present   Feeling of Stress : Not at all  Social Connections: Moderately Integrated   Frequency of Communication with Friends and Family: Three times a week   Frequency of Social Gatherings with Friends and Family: Once a week   Attends Religious Services: More than 4 times per year   Active Member of [employer] or Organizations: Yes   Attends Archivist Meetings: 1 to 4 times per year   Marital Status: Widowed  Human resources officer Violence: Not At Risk   Fear of Current or Ex-Partner: No   Emotionally Abused: No   Physically Abused: No   Sexually Abused: No    Outpatient Medications Prior to Visit  Medication Sig Dispense Refill   acetaminophen (TYLENOL) 650 MG CR tablet Take 650 mg by mouth 2 (two) times daily.     Ascorbic Acid (VITAMIN C) 1000 MG tablet Take 1,000 mg by mouth daily.     atenolol (TENORMIN) 50 MG tablet TAKE 1 TABLET(50 MG) BY MOUTH DAILY 90 tablet 2   busPIRone (BUSPAR) 10 MG tablet TAKE 1 TABLET(10 MG) BY MOUTH THREE TIMES DAILY 90 tablet 2   ferrous sulfate 325 (65 FE) MG tablet Take  by mouth.     fluticasone (FLONASE) 50 MCG/ACT nasal spray SHAKE LIQUID AND USE 1 SPRAY IN EACH NOSTRIL AT BEDTIME 16 g 6   GEMTESA 75 MG TABS Take 1 tablet by mouth daily.     LORazepam (ATIVAN) 1 MG tablet TAKE 1 TABLET BY MOUTH TWICE DAILY AS NEEDED 60 tablet 3   losartan-hydrochlorothiazide (HYZAAR) 100-12.5 MG tablet TAKE 1 TABLET BY MOUTH DAILY 90 tablet 2   Multiple Vitamins-Minerals (MULTIVITAMIN WOMEN 50+ PO) Take 1 tablet by mouth daily.     omeprazole (PRILOSEC) 40 MG capsule TAKE 1 CAPSULE(40 MG) BY MOUTH DAILY 90 capsule 2   pravastatin (PRAVACHOL) 40 MG tablet Take 40 mg by mouth daily as needed.     PREMARIN vaginal cream SMARTSIG:1 Vaginal Every  Night     venlafaxine XR (EFFEXOR-XR) 37.5 MG 24 hr capsule TAKE 1 CAPSULE(37.5 MG) BY MOUTH DAILY WITH BREAKFAST 90 capsule 0   zinc gluconate 50 MG tablet Take 50 mg by mouth daily.     albuterol (PROVENTIL) (2.5 MG/3ML) 0.083% nebulizer solution Use 1 Vial In Nebulizer Every 6 Hours As Needed For Wheezing or Shortness of Breath 90 mL 11   doxycycline (VIBRAMYCIN) 100 MG capsule Take 100 mg by mouth 2 (two) times daily. (Patient not taking: Reported on 11/24/2021)     mupirocin cream (BACTROBAN) 2 % Apply 1 application topically 2 (two) times daily. (Patient not taking: Reported on 11/24/2021) 15 g 2   mupirocin ointment (BACTROBAN) 2 % Apply topically 2 (two) times daily. (Patient not taking: Reported on 11/24/2021)     ofloxacin (FLOXIN OTIC) 0.3 % OTIC solution Place 5 drops into the right ear 2 (two) times daily. 5 mL 0   No facility-administered medications prior to visit.    Allergies  Allergen Reactions   Ceftin [Cefuroxime] Diarrhea   Lisinopril Diarrhea    Review of Systems  Constitutional:  Negative for chills, fatigue and fever.  HENT:  Positive for congestion, postnasal drip and sinus pain. Negative for ear pain and sore throat.   Eyes:  Negative for visual disturbance.  Respiratory:  Positive for cough. Negative for shortness of breath.   Cardiovascular:  Negative for chest pain and palpitations.  Gastrointestinal:  Negative for abdominal pain, constipation, diarrhea, nausea and vomiting.  Endocrine: Negative for polydipsia, polyphagia and polyuria.  Genitourinary:  Negative for difficulty urinating and dysuria.  Musculoskeletal:  Negative for arthralgias, back pain and myalgias.  Skin:  Negative for rash.  Neurological:  Negative for headaches.  Psychiatric/Behavioral:  Negative for dysphoric mood. The patient is not nervous/anxious.       Objective:    Physical Exam Vitals reviewed.  Constitutional:      General: She is not in acute distress.    Appearance: Normal  appearance.  HENT:     Head: Normocephalic.     Right Ear: Tympanic membrane normal.     Left Ear: Tympanic membrane normal.     Nose: Congestion present.  Eyes:     Conjunctiva/sclera: Conjunctivae normal.     Pupils: Pupils are equal, round, and reactive to light.  Cardiovascular:     Rate and Rhythm: Normal rate and regular rhythm.     Pulses: Normal pulses.     Heart sounds: Normal heart sounds. No murmur heard.   No gallop.  Pulmonary:     Effort: No respiratory distress.     Breath sounds: No wheezing.  Abdominal:     General: Abdomen is flat. Bowel sounds  are normal. There is no distension.     Tenderness: There is no abdominal tenderness.  Musculoskeletal:     Cervical back: Normal range of motion.  Skin:    Capillary Refill: Capillary refill takes less than 2 seconds.  Neurological:     General: No focal deficit present.     Mental Status: She is alert and oriented to person, place, and time. Mental status is at baseline.    BP 120/70    Pulse 61    Temp 98.3 F (36.8 C)    Resp 15    Ht 5' (1.524 m)    Wt 118 lb (53.5 kg)    SpO2 94%    BMI 23.05 kg/m  Wt Readings from Last 3 Encounters:  01/19/22 118 lb (53.5 kg)  01/05/22 118 lb (53.5 kg)  11/26/21 120 lb (54.4 kg)    Health Maintenance Due  Topic Date Due   TETANUS/TDAP  Never done   Zoster Vaccines- Shingrix (1 of 2) Never done   DEXA SCAN  Never done   MAMMOGRAM  02/18/2018   COVID-19 Vaccine (4 - Booster for Pfizer series) 10/19/2021    There are no preventive care reminders to display for this patient.   Lab Results  Component Value Date   TSH 3.260 05/26/2021   Lab Results  Component Value Date   WBC 6.9 05/26/2021   HGB 13.1 05/26/2021   HCT 39.3 05/26/2021   MCV 99 (H) 05/26/2021   PLT 260 05/26/2021   Lab Results  Component Value Date   NA 139 05/26/2021   K 4.4 05/26/2021   CO2 26 05/26/2021   GLUCOSE 95 05/26/2021   BUN 18 05/26/2021   CREATININE 0.94 05/26/2021   BILITOT  0.7 05/26/2021   ALKPHOS 66 05/26/2021   AST 18 05/26/2021   ALT 15 05/26/2021   PROT 6.8 05/26/2021   ALBUMIN 4.6 05/26/2021   CALCIUM 10.0 05/26/2021   ANIONGAP 10 11/04/2019   EGFR 59 (L) 05/26/2021   Lab Results  Component Value Date   CHOL 207 (H) 05/26/2021   Lab Results  Component Value Date   HDL 68 05/26/2021   Lab Results  Component Value Date   LDLCALC 125 (H) 05/26/2021   Lab Results  Component Value Date   TRIG 80 05/26/2021   Lab Results  Component Value Date   CHOLHDL 3.0 05/26/2021   No results found for: HGBA1C     Assessment & Plan:   Problem List Items Addressed This Visit   None Visit Diagnoses     Acute cough    -  Primary Coughing for 3 weeks Treat as COPD with exacerbation Renew albuterol for nebulizer Prednisone pack for 6 days Augmentin for infection    Flu-like symptoms       Relevant Orders   Influenza A/B-negative   At increased risk of exposure to COVID-19 virus       Relevant Orders   POC COVID-19- negative        Orders Placed This Encounter  Procedures   POC COVID-19   Influenza A/B     Follow-up: Return if symptoms worsen or fail to improve.  An After Visit Summary was printed and given to the patient.  Reinaldo Meeker, MD [facility] [phone]

## 2022-01-20 DIAGNOSIS — J410 Simple chronic bronchitis: Secondary | ICD-10-CM | POA: Diagnosis not present

## 2022-01-28 DIAGNOSIS — N329 Bladder disorder, unspecified: Secondary | ICD-10-CM | POA: Diagnosis not present

## 2022-02-15 DIAGNOSIS — J449 Chronic obstructive pulmonary disease, unspecified: Secondary | ICD-10-CM | POA: Diagnosis not present

## 2022-02-18 ENCOUNTER — Telehealth: Payer: Medicare Other

## 2022-03-17 ENCOUNTER — Other Ambulatory Visit: Payer: Self-pay | Admitting: Legal Medicine

## 2022-03-17 DIAGNOSIS — J410 Simple chronic bronchitis: Secondary | ICD-10-CM | POA: Diagnosis not present

## 2022-03-17 DIAGNOSIS — F320 Major depressive disorder, single episode, mild: Secondary | ICD-10-CM

## 2022-03-17 DIAGNOSIS — J449 Chronic obstructive pulmonary disease, unspecified: Secondary | ICD-10-CM | POA: Diagnosis not present

## 2022-03-18 ENCOUNTER — Ambulatory Visit (INDEPENDENT_AMBULATORY_CARE_PROVIDER_SITE_OTHER): Payer: Medicare Other | Admitting: *Deleted

## 2022-03-18 DIAGNOSIS — J449 Chronic obstructive pulmonary disease, unspecified: Secondary | ICD-10-CM

## 2022-03-18 DIAGNOSIS — F320 Major depressive disorder, single episode, mild: Secondary | ICD-10-CM

## 2022-03-18 DIAGNOSIS — I10 Essential (primary) hypertension: Secondary | ICD-10-CM

## 2022-03-18 NOTE — Patient Instructions (Signed)
Visit Information  ? ?Thank you for taking time to visit with me today. Please don't hesitate to contact me if I can be of assistance to you before our next scheduled telephone appointment. ? ?Following are the goals we discussed today:  ?(Copy and paste patient goals from clinical care plan here) ? ?Our next appointment is by telephone on 05/06/22 at 10:45 ? ?Please call the care guide team at [phone] if you need to cancel or reschedule your appointment.  ? ?If you are experiencing a Mental Health or [place] or need someone to talk to, please call the Suicide and Crisis Lifeline: 988 ?call the [place] National Suicide Prevention Lifeline: (386)517-2668 or TTY: (212)738-3670 TTY 8504427182) to talk to a trained counselor ?call 911  ? ?Following is a copy of your full care plan:  ?Care Plan : [place] of Care  ?Updates made by Ilean China, RN since 03/18/2022 12:00 AM  ?  ? ?Problem: No plan of care established for managment of chronic disease states ( COPD, Depression, Hypertension, protein calorie malnutrition)   ?Priority: High  ?  ? ?Long-Range Goal: Developement of plan of care for Chronic Diesase states ( COPD, Depression, hypertension and protein calorie malnutrition)   ?Start Date: 11/24/2021  ?Expected End Date: 11/24/2022  ?This Visit's Progress: On track  ?Priority: High  ?Note:   ?Current Barriers:  ?Chronic Disease Management support and education needs related to HTN, COPD, Depression: depressed mood, and protein calorie malnutrition ? ?RNCM Clinical Goal(s):  ?Patient will demonstrate ongoing adherence to prescribed treatment plan for HTN, COPD, Depression, and protein calorie malnutrition as evidenced by patient report. ?continue to work with Consulting civil engineer and/or Social Worker to address care management and care coordination needs related to HTN, COPD, and Depression as evidenced by adherence to CM Team Scheduled appointments     through collaboration with RN Care  manager, provider, and care team.  ? ?Interventions: ?1:1 collaboration with primary care provider regarding development and update of comprehensive plan of care as evidenced by provider attestation and co-signature ?Inter-disciplinary care team collaboration (see longitudinal plan of care) ?Evaluation of current treatment plan related to  self management and patient's adherence to plan as established by provider ?Assessed family/social support ?Per patient she has ample support ?Discussed mobility and ability to perform ADLs. ?No limitations ?Assessed SDOH needs  ?None identified ? ? ?COPD: (Status: Goal on Track (progressing): YES.) Long Term Goal  ?Reviewed medications with patient, including use of prescribed maintenance and rescue inhalers, and provided instruction on medication management and the importance of adherence ?Advised patient to self assesses COPD action plan zone and make appointment with provider if in the yellow zone for 48 hours without improvement ?Encouraged patient to call MD for changes in condition.  ?Encouraged patient to continue to use her oxygen (2L at night) and her nebulizer as prescribed. ?Reviewed current symptoms. Occasional mild, non-productive cough. Advised this is likely normal but to call PCP with any new or worsening symptoms.  ? ?Protein Calorie Malnutrition:  (Status: Goal on Track (progressing): YES.) Long Term Goal  ?Evaluation of current treatment plan related to  protein calorie malnutrition , self-management and patient's adherence to plan as established by provider. ?Discussed diet and appetite ?Patient reports great improvement in appetite and is eating meals regularly. She will drink a supplement occasionally but overall feels that she is getting enough nutrition from her diet.  ?Discussed social support. She has friends/family that she sees often and  eats with. This can be very helpful as meals are often a social time.  ?Encouraged to reach out to PCP with any new  concerns ? ?Hypertension: (Status: Goal on Track (progressing): YES.) Long Term Goal  ?Last practice recorded BP readings:  ?BP Readings from Last 3 Encounters:  ?01/19/22 120/70  ?11/26/21 122/62  ?11/17/21 110/80  ?Evaluation of current treatment plan related to hypertension self management and patient's adherence to plan as established by provider;   ?Reviewed medications with patient and discussed importance of compliance ?Discussed home blood pressure monitoring ?Encouraged to monitor and record 3 times per week and to call PCP with any readings outside of recommended range ?Education provided on how blood pressure can creep up over time and go unnoticed if not being checked  ? ?Depression/Grief Reaction:  (Status:  Goal on track:  Yes.)  Long Term Goal ?Evaluation of current treatment plan related to Depression, self-management and patient's adherence to plan as established by provider. ?Discussed plans with patient for ongoing care management follow up and provided patient with direct contact information for care management team ?Therapeutic listening utilized regarding recent death of husband due to [place] ?Patient reports that her mood is improving. She understandably still feels sad and cries at times, but there are more good days than bad days recently. She feels that she's progressing through the grief process appropriately. She sounded very upbeat and positive on the phone. ?Discussed daily activities and importance of staying active and social.  ?Patient enjoys being productive and socializes often. For example she sewed some grass seed this morning and spread straw over it. She expressed understanding of importance to continue hobbies and to stay physically and socially active and often encourages her friend to be more active.  ? ? ?Patient Goals/Self-Care Activities: ?Take medications as prescribed   ?Perform all self care activities independently  ?Perform IADL's (shopping, preparing meals,  housekeeping, managing finances) independently ?Call provider office for new concerns or questions  ?Continue to use your oxygen at night.  ?Patient will remain physically and socially active ?Reach out to LCSW if counseling is desired  ?Check and record blood pressure 3 times per week and call PCP with any readings outside of recommended range ?Continue to eat meals regularly and drink boost, ensure, etc as needed to supplement diet ? ?  ?  ? ?The patient verbalized understanding of instructions, educational materials, and care plan provided today and declined offer to receive copy of patient instructions, educational materials, and care plan.  ? ? ?Plan:Telephone follow up appointment with care management team member scheduled for:  05/06/22 with RNCM ?The patient has been provided with contact information for the care management team and has been advised to call with any health related questions or concerns.  ? ?Chong Sicilian, BSN, RN-BC ?Embedded Chronic Care Manager ?[facility] Care Management ?Direct Dial: [phone] ? ? ?  ?

## 2022-03-18 NOTE — Chronic Care Management (AMB) (Signed)
?Chronic Care Management  ? ?CCM RN Visit Note ? ?03/18/2022 ?Name: Brenda Patrick MRN: [id number] DOB: 09-Nov-1932 ? ?Subjective: ?Brenda Patrick is a 88 y.o. year old female who is a primary care patient of Marge Duncans, Hershal Coria. The care management team was consulted for assistance with disease management and care coordination needs.   ? ?Engaged with patient by telephone for follow up visit in response to provider referral for case management and/or care coordination services.  ? ?Consent to Services:  ?The patient was given information about Chronic Care Management services, agreed to services, and gave verbal consent prior to initiation of services.  Please see initial visit note for detailed documentation.  ? ?Patient agreed to services and verbal consent obtained.  ? ?Assessment: Review of patient past medical history, allergies, medications, health status, including review of consultants reports, laboratory and other test data, was performed as part of comprehensive evaluation and provision of chronic care management services.  ? ?SDOH (Social Determinants of Health) assessments and interventions performed:   ? ?CCM Care Plan ? ?Allergies  ?Allergen Reactions  ? Ceftin [Cefuroxime] Diarrhea  ? Lisinopril Diarrhea  ? ? ?Outpatient Encounter Medications as of 03/18/2022  ?Medication Sig  ? acetaminophen (TYLENOL) 650 MG CR tablet Take 650 mg by mouth 2 (two) times daily.  ? albuterol (PROVENTIL) (2.5 MG/3ML) 0.083% nebulizer solution Use 1 Vial In Nebulizer Every 6 Hours As Needed For Wheezing or Shortness of Breath  ? amoxicillin-clavulanate (AUGMENTIN) 875-125 MG tablet Take 1 tablet by mouth 2 (two) times daily.  ? Ascorbic Acid (VITAMIN C) 1000 MG tablet Take 1,000 mg by mouth daily.  ? atenolol (TENORMIN) 50 MG tablet TAKE 1 TABLET(50 MG) BY MOUTH DAILY  ? busPIRone (BUSPAR) 10 MG tablet TAKE 1 TABLET(10 MG) BY MOUTH THREE TIMES DAILY  ? ferrous sulfate 325 (65 FE) MG tablet Take by mouth.  ? fluticasone  (FLONASE) 50 MCG/ACT nasal spray SHAKE LIQUID AND USE 1 SPRAY IN EACH NOSTRIL AT BEDTIME  ? GEMTESA 75 MG TABS Take 1 tablet by mouth daily.  ? LORazepam (ATIVAN) 1 MG tablet TAKE 1 TABLET BY MOUTH TWICE DAILY AS NEEDED  ? losartan-hydrochlorothiazide (HYZAAR) 100-12.5 MG tablet TAKE 1 TABLET BY MOUTH DAILY  ? Multiple Vitamins-Minerals (MULTIVITAMIN WOMEN 50+ PO) Take 1 tablet by mouth daily.  ? omeprazole (PRILOSEC) 40 MG capsule TAKE 1 CAPSULE(40 MG) BY MOUTH DAILY  ? pravastatin (PRAVACHOL) 40 MG tablet Take 40 mg by mouth daily as needed.  ? predniSONE (STERAPRED UNI-PAK 21 TAB) 10 MG (21) TBPK tablet Take 6ills first day , then 5 pills day 2 and then cut down one pill day until gone  ? PREMARIN vaginal cream SMARTSIG:1 Vaginal Every Night  ? venlafaxine XR (EFFEXOR-XR) 37.5 MG 24 hr capsule TAKE 1 CAPSULE(37.5 MG) BY MOUTH DAILY WITH BREAKFAST  ? zinc gluconate 50 MG tablet Take 50 mg by mouth daily.  ? ?No facility-administered encounter medications on file as of 03/18/2022.  ? ? ?Patient Active Problem List  ? Diagnosis Date Noted  ? Atherosclerosis of aorta ([place]) 11/17/2021  ? Stress incontinence 11/25/2020  ? Gastroesophageal reflux disease with esophagitis 05/26/2020  ? Mixed hyperlipidemia 05/26/2020  ? Major depressive disorder, single episode, mild ([place]) 05/26/2020  ? OSA (obstructive sleep apnea) 05/26/2020  ? COPD (chronic obstructive pulmonary disease) ([place]) 05/26/2020  ? Moderate protein-calorie malnutrition ([place]) 05/26/2020  ? BMI 20.0-20.9, adult 05/26/2020  ? Essential hypertension 10/30/2019  ? ? ?Conditions to be addressed/monitored:HTN, COPD, Depression, and  grief reaction ? ?Care Plan : RN Care Manager Plan of Care  ?Updates made by Ilean China, RN since 03/18/2022 12:00 AM  ?  ? ?Problem: No plan of care established for managment of chronic disease states ( COPD, Depression, Hypertension, protein calorie malnutrition)   ?Priority: High  ?  ? ?Long-Range Goal: Developement of plan of care  for Chronic Diesase states ( COPD, Depression, hypertension and protein calorie malnutrition)   ?Start Date: 11/24/2021  ?Expected End Date: 11/24/2022  ?This Visit's Progress: On track  ?Priority: High  ?Note:   ?Current Barriers:  ?Chronic Disease Management support and education needs related to HTN, COPD, Depression: depressed mood, and protein calorie malnutrition ? ?RNCM Clinical Goal(s):  ?Patient will demonstrate ongoing adherence to prescribed treatment plan for HTN, COPD, Depression, and protein calorie malnutrition as evidenced by patient report. ?continue to work with Consulting civil engineer and/or Social Worker to address care management and care coordination needs related to HTN, COPD, and Depression as evidenced by adherence to CM Team Scheduled appointments     through collaboration with RN Care manager, provider, and care team.  ? ?Interventions: ?1:1 collaboration with primary care provider regarding development and update of comprehensive plan of care as evidenced by provider attestation and co-signature ?Inter-disciplinary care team collaboration (see longitudinal plan of care) ?Evaluation of current treatment plan related to  self management and patient's adherence to plan as established by provider ?Assessed family/social support ?Per patient she has ample support ?Discussed mobility and ability to perform ADLs. ?No limitations ?Assessed SDOH needs  ?None identified ? ? ?COPD: (Status: Goal on Track (progressing): YES.) Long Term Goal  ?Reviewed medications with patient, including use of prescribed maintenance and rescue inhalers, and provided instruction on medication management and the importance of adherence ?Advised patient to self assesses COPD action plan zone and make appointment with provider if in the yellow zone for 48 hours without improvement ?Encouraged patient to call MD for changes in condition.  ?Encouraged patient to continue to use her oxygen (2L at night) and her nebulizer as  prescribed. ?Reviewed current symptoms. Occasional mild, non-productive cough. Advised this is likely normal but to call PCP with any new or worsening symptoms.  ? ?Protein Calorie Malnutrition:  (Status: Goal on Track (progressing): YES.) Long Term Goal  ?Evaluation of current treatment plan related to  protein calorie malnutrition , self-management and patient's adherence to plan as established by provider. ?Discussed diet and appetite ?Patient reports great improvement in appetite and is eating meals regularly. She will drink a supplement occasionally but overall feels that she is getting enough nutrition from her diet.  ?Discussed social support. She has friends/family that she sees often and eats with. This can be very helpful as meals are often a social time.  ?Encouraged to reach out to PCP with any new concerns ? ?Hypertension: (Status: Goal on Track (progressing): YES.) Long Term Goal  ?Last practice recorded BP readings:  ?BP Readings from Last 3 Encounters:  ?01/19/22 120/70  ?11/26/21 122/62  ?11/17/21 110/80  ?Evaluation of current treatment plan related to hypertension self management and patient's adherence to plan as established by provider;   ?Reviewed medications with patient and discussed importance of compliance ?Discussed home blood pressure monitoring ?Encouraged to monitor and record 3 times per week and to call PCP with any readings outside of recommended range ?Education provided on how blood pressure can creep up over time and go unnoticed if not being checked  ? ?Depression/Grief Reaction:  (Status:  Goal on track:  Yes.)  Long Term Goal ?Evaluation of current treatment plan related to Depression, self-management and patient's adherence to plan as established by provider. ?Discussed plans with patient for ongoing care management follow up and provided patient with direct contact information for care management team ?Therapeutic listening utilized regarding recent death of husband due to  [place] ?Patient reports that her mood is improving. She understandably still feels sad and cries at times, but there are more good days than bad days recently. She feels that she's progressing through the grief proc

## 2022-03-24 ENCOUNTER — Ambulatory Visit (INDEPENDENT_AMBULATORY_CARE_PROVIDER_SITE_OTHER): Payer: Medicare Other | Admitting: Physician Assistant

## 2022-03-24 ENCOUNTER — Encounter: Payer: Self-pay | Admitting: Physician Assistant

## 2022-03-24 VITALS — BP 122/82 | HR 60 | Temp 98.3°F | Ht 60.0 in | Wt 121.0 lb

## 2022-03-24 DIAGNOSIS — E782 Mixed hyperlipidemia: Secondary | ICD-10-CM | POA: Diagnosis not present

## 2022-03-24 DIAGNOSIS — F418 Other specified anxiety disorders: Secondary | ICD-10-CM | POA: Insufficient documentation

## 2022-03-24 DIAGNOSIS — E559 Vitamin D deficiency, unspecified: Secondary | ICD-10-CM | POA: Diagnosis not present

## 2022-03-24 DIAGNOSIS — J449 Chronic obstructive pulmonary disease, unspecified: Secondary | ICD-10-CM

## 2022-03-24 DIAGNOSIS — I10 Essential (primary) hypertension: Secondary | ICD-10-CM | POA: Diagnosis not present

## 2022-03-24 MED ORDER — LORAZEPAM 1 MG PO TABS: 1.0000 mg | ORAL_TABLET | Freq: Two times a day (BID) | ORAL | 1 refills | Status: DC | PRN

## 2022-03-24 NOTE — Assessment & Plan Note (Signed)
Continue meds as directed

## 2022-03-24 NOTE — Assessment & Plan Note (Signed)
labwork pending 

## 2022-03-24 NOTE — Assessment & Plan Note (Signed)
Continue current meds labwork pending 

## 2022-03-24 NOTE — Assessment & Plan Note (Signed)
labwork pending °Continue current meds °

## 2022-03-24 NOTE — Progress Notes (Signed)
? ?Subjective:  ?Patient ID: Brenda Patrick, female    DOB: 1932/08/15  Age: 88 y.o. MRN: [id number] ? ?Chief Complaint  ?Patient presents with  ? Hyperlipidemia  ? Gastroesophageal Reflux  ? Hypertension  ? ? ?HPI ? Pt with history of hypertension - She is currently on tenormin '50mg'$  qd and hyzaar 100/12.'5mg'$   ?She denies chest pain/sob/edema/palpitations ? ?Pt with history of hyperlipidemia - she is currently on pravachol '40mg'$  qd  -she is due for labwork ?States she does try to watch her diet ? ?Pt with history of depression with anxiety - states symptoms have been stable on buspar , ativan and venlafaxine ? ?Pt with history of GERD - stable on prilosec '40mg'$  qd ?Current Outpatient Medications on File Prior to Visit  ?Medication Sig Dispense Refill  ? acetaminophen (TYLENOL) 650 MG CR tablet Take 650 mg by mouth 2 (two) times daily.    ? albuterol (PROVENTIL) (2.5 MG/3ML) 0.083% nebulizer solution Use 1 Vial In Nebulizer Every 6 Hours As Needed For Wheezing or Shortness of Breath 90 mL 11  ? Ascorbic Acid (VITAMIN C) 1000 MG tablet Take 1,000 mg by mouth daily.    ? atenolol (TENORMIN) 50 MG tablet TAKE 1 TABLET(50 MG) BY MOUTH DAILY 90 tablet 2  ? busPIRone (BUSPAR) 10 MG tablet TAKE 1 TABLET(10 MG) BY MOUTH THREE TIMES DAILY 90 tablet 2  ? fluticasone (FLONASE) 50 MCG/ACT nasal spray SHAKE LIQUID AND USE 1 SPRAY IN EACH NOSTRIL AT BEDTIME 16 g 6  ? GEMTESA 75 MG TABS Take 1 tablet by mouth daily.    ? LORazepam (ATIVAN) 1 MG tablet TAKE 1 TABLET BY MOUTH TWICE DAILY AS NEEDED 60 tablet 3  ? losartan-hydrochlorothiazide (HYZAAR) 100-12.5 MG tablet TAKE 1 TABLET BY MOUTH DAILY 90 tablet 2  ? Multiple Vitamins-Minerals (MULTIVITAMIN WOMEN 50+ PO) Take 1 tablet by mouth daily.    ? omeprazole (PRILOSEC) 40 MG capsule TAKE 1 CAPSULE(40 MG) BY MOUTH DAILY 90 capsule 2  ? pravastatin (PRAVACHOL) 40 MG tablet Take 40 mg by mouth daily as needed.    ? PREMARIN vaginal cream SMARTSIG:1 Vaginal Every Night    ? venlafaxine  XR (EFFEXOR-XR) 37.5 MG 24 hr capsule TAKE 1 CAPSULE(37.5 MG) BY MOUTH DAILY WITH BREAKFAST 90 capsule 0  ? zinc gluconate 50 MG tablet Take 50 mg by mouth daily.    ? ?No current facility-administered medications on file prior to visit.  ? ?Past Medical History:  ?Diagnosis Date  ? Acute bronchitis due to other specified organisms 04/25/2020  ? COPD (chronic obstructive pulmonary disease) ([place]) 05/26/2020  ? COVID-19 10/30/2019  ? Gastroesophageal reflux disease with esophagitis 05/26/2020  ? Hypertension   ? Major depressive disorder, single episode, mild ([place]) 05/26/2020  ? Mixed hyperlipidemia 05/26/2020  ? Moderate protein-calorie malnutrition ([place]) 05/26/2020  ? OSA (obstructive sleep apnea) 05/26/2020  ? ?Past Surgical History:  ?Procedure Laterality Date  ? ABDOMINAL HYSTERECTOMY    ? DUODENAL DIVERTICULECTOMY  04/09/1993  ? OVARIAN CYST REMOVAL    ?  ?History reviewed. No pertinent family history. ?Social History  ? ?Socioeconomic History  ? Marital status: Widowed  ?  Spouse name: Not on file  ? Number of children: 0  ? Years of education: 66  ? Highest education level: Not on file  ?Occupational History  ? Occupation: Homemaker  ?Tobacco Use  ? Smoking status: Never  ? Smokeless tobacco: Never  ?Vaping Use  ? Vaping Use: Never used  ?Substance and Sexual Activity  ?  Alcohol use: Never  ? Drug use: Never  ? Sexual activity: Not Currently  ?Other Topics Concern  ? Not on file  ?Social History Narrative  ? [employer]  44 years  ? ?Social Determinants of Health  ? ?Financial Resource Strain: Low Risk   ? Difficulty of Paying Living Expenses: Not hard at all  ?Food Insecurity: No Food Insecurity  ? Worried About Charity fundraiser in the Last Year: Never true  ? Ran Out of Food in the Last Year: Never true  ?Transportation Needs: No Transportation Needs  ? Lack of Transportation (Medical): No  ? Lack of Transportation (Non-Medical): No  ?Physical Activity: Not on file  ?Stress: No Stress Concern Present  ? Feeling  of Stress : Not at all  ?Social Connections: Moderately Integrated  ? Frequency of Communication with Friends and Family: Three times a week  ? Frequency of Social Gatherings with Friends and Family: Once a week  ? Attends Religious Services: More than 4 times per year  ? Active Member of Clubs or Organizations: Yes  ? Attends Archivist Meetings: 1 to 4 times per year  ? Marital Status: Widowed  ? ? ?Review of Systems ?CONSTITUTIONAL: Negative for chills, fatigue, fever, unintentional weight gain and unintentional weight loss.  ? ?CARDIOVASCULAR: Negative for chest pain, dizziness, palpitations and pedal edema.  ?RESPIRATORY: Negative for recent cough and dyspnea.  ?GASTROINTESTINAL: Negative for abdominal pain, acid reflux symptoms, constipation, diarrhea, nausea and vomiting.  ?MSK: Negative for arthralgias and myalgias.  ?INTEGUMENTARY: Negative for rash.  ?NEUROLOGICAL: Negative for dizziness and headaches.  ?PSYCHIATRIC: Negative for sleep disturbance and to question depression screen.  Negative for depression, negative for anhedonia.  ?   ? ? ?Objective:  ?BP 122/82   Pulse 60   Temp 98.3 ?F (36.8 ?C)   Ht 5' (1.524 m)   Wt 121 lb (54.9 kg)   SpO2 96%   BMI 23.63 kg/m?  ? ? ?  03/24/2022  ?  9:47 AM 01/19/2022  ?  2:02 PM 01/05/2022  ? 11:23 AM  ?BP/Weight  ?Systolic BP 846 962   ?Diastolic BP 82 70   ?Wt. (Lbs) 121 118 118  ?BMI 23.63 kg/m2 23.05 kg/m2 23.05 kg/m2  ? ? ?Physical Exam ?PHYSICAL EXAM:  ? ?VS: BP 122/82   Pulse 60   Temp 98.3 ?F (36.8 ?C)   Ht 5' (1.524 m)   Wt 121 lb (54.9 kg)   SpO2 96%   BMI 23.63 kg/m?  ? ?GEN: Well nourished, well developed, in no acute distress  ?Cardiac: RRR; no murmurs, rubs, or gallops,no edema -  ?Respiratory:  normal respiratory rate and pattern with no distress - normal breath sounds with no rales, rhonchi, wheezes or rubs ?GI: normal bowel sounds, no masses or tenderness ?MS: no deformity or atrophy  ?Skin: warm and dry, no rash  ?Neuro:  Alert  and Oriented x 3, Strength and sensation are intact - CN II-Xii grossly intact ?Psych: euthymic mood, appropriate affect and demeanor ? ?Diabetic Foot Exam - Simple   ?No data filed ?  ?  ? ?Lab Results  ?Component Value Date  ? WBC 6.9 05/26/2021  ? HGB 13.1 05/26/2021  ? HCT 39.3 05/26/2021  ? PLT 260 05/26/2021  ? GLUCOSE 95 05/26/2021  ? CHOL 207 (H) 05/26/2021  ? TRIG 80 05/26/2021  ? HDL 68 05/26/2021  ? LDLCALC 125 (H) 05/26/2021  ? ALT 15 05/26/2021  ? AST 18 05/26/2021  ?  NA 139 05/26/2021  ? K 4.4 05/26/2021  ? CL 99 05/26/2021  ? CREATININE 0.94 05/26/2021  ? BUN 18 05/26/2021  ? CO2 26 05/26/2021  ? TSH 3.260 05/26/2021  ? ? ? ? ?Assessment & Plan:  ? ?Problem List Items Addressed This Visit   ? ?  ? Cardiovascular and Mediastinum  ? Essential hypertension - Primary  ? Relevant Orders  ? CBC with Differential/Platelet  ? Comprehensive metabolic panel  ? TSH  ?  ? Other  ? Mixed hyperlipidemia  ? Relevant Orders  ? Lipid panel  ? ?Other Visit Diagnoses   ? ? Vitamin D deficiency      ? Relevant Orders  ? VITAMIN D 25 Hydroxy (Vit-D Deficiency, Fractures)  ? ?  ?. ? ?No orders of the defined types were placed in this encounter. ? ? ?Orders Placed This Encounter  ?Procedures  ? CBC with Differential/Platelet  ? Comprehensive metabolic panel  ? TSH  ? Lipid panel  ? VITAMIN D 25 Hydroxy (Vit-D Deficiency, Fractures)  ?  ? ?Follow-up: Return in about 6 months (around 09/24/2022) for chronic fasting follow up. ? ?An After Visit Summary was printed and given to the patient. ? ?SARA R Tanza Pellot, PA-C ?[place] ?[phone]

## 2022-03-24 NOTE — Assessment & Plan Note (Signed)
Continue current meds as directed 

## 2022-03-25 ENCOUNTER — Other Ambulatory Visit: Payer: Self-pay | Admitting: Physician Assistant

## 2022-03-25 DIAGNOSIS — R899 Unspecified abnormal finding in specimens from other organs, systems and tissues: Secondary | ICD-10-CM

## 2022-03-25 LAB — COMPREHENSIVE METABOLIC PANEL
ALT: 13 IU/L (ref 0–32)
AST: 19 IU/L (ref 0–40)
Albumin/Globulin Ratio: 2 (ref 1.2–2.2)
Albumin: 4.3 g/dL (ref 3.6–4.6)
Alkaline Phosphatase: 68 IU/L (ref 44–121)
BUN/Creatinine Ratio: 26 (ref 12–28)
BUN: 20 mg/dL (ref 8–27)
Bilirubin Total: 0.5 mg/dL (ref 0.0–1.2)
CO2: 26 mmol/L (ref 20–29)
Calcium: 9.7 mg/dL (ref 8.7–10.3)
Chloride: 101 mmol/L (ref 96–106)
Creatinine, Ser: 0.76 mg/dL (ref 0.57–1.00)
Globulin, Total: 2.1 g/dL (ref 1.5–4.5)
Glucose: 93 mg/dL (ref 70–99)
Potassium: 4.1 mmol/L (ref 3.5–5.2)
Sodium: 142 mmol/L (ref 134–144)
Total Protein: 6.4 g/dL (ref 6.0–8.5)
eGFR: 76 mL/min/{1.73_m2} (ref >59)

## 2022-03-25 LAB — CARDIOVASCULAR RISK ASSESSMENT

## 2022-03-25 LAB — CBC WITH DIFFERENTIAL/PLATELET
Basophils Absolute: 0.1 10*3/uL (ref 0.0–0.2)
Basos: 1 %
EOS (ABSOLUTE): 0.2 10*3/uL (ref 0.0–0.4)
Eos: 3 %
Hematocrit: 36.5 % (ref 34.0–46.6)
Hemoglobin: 12.1 g/dL (ref 11.1–15.9)
Immature Grans (Abs): 0 10*3/uL (ref 0.0–0.1)
Immature Granulocytes: 0 %
Lymphocytes Absolute: 2.6 10*3/uL (ref 0.7–3.1)
Lymphs: 38 %
MCH: 33.2 pg — ABNORMAL HIGH (ref 26.6–33.0)
MCHC: 33.2 g/dL (ref 31.5–35.7)
MCV: 100 fL — ABNORMAL HIGH (ref 79–97)
Monocytes Absolute: 0.4 10*3/uL (ref 0.1–0.9)
Monocytes: 6 %
Neutrophils Absolute: 3.6 10*3/uL (ref 1.4–7.0)
Neutrophils: 52 %
Platelets: 269 10*3/uL (ref 150–450)
RBC: 3.65 x10E6/uL — ABNORMAL LOW (ref 3.77–5.28)
RDW: 11.3 % — ABNORMAL LOW (ref 11.7–15.4)
WBC: 6.9 10*3/uL (ref 3.4–10.8)

## 2022-03-25 LAB — TSH: TSH: 2.13 u[IU]/mL (ref 0.450–4.500)

## 2022-03-25 LAB — LIPID PANEL
Chol/HDL Ratio: 3 ratio (ref 0.0–4.4)
Cholesterol, Total: 191 mg/dL (ref 100–199)
HDL: 64 mg/dL (ref >39)
LDL Chol Calc (NIH): 115 mg/dL — ABNORMAL HIGH (ref 0–99)
Triglycerides: 63 mg/dL (ref 0–149)
VLDL Cholesterol Cal: 12 mg/dL (ref 5–40)

## 2022-03-25 LAB — VITAMIN D 25 HYDROXY (VIT D DEFICIENCY, FRACTURES): Vit D, 25-Hydroxy: 66.7 ng/mL (ref 30.0–100.0)

## 2022-03-29 LAB — B12 AND FOLATE PANEL
Folate: >20 ng/mL (ref >3.0)
Vitamin B-12: 308 pg/mL (ref 232–1245)

## 2022-03-29 LAB — SPECIMEN STATUS REPORT

## 2022-04-13 ENCOUNTER — Other Ambulatory Visit: Payer: Self-pay | Admitting: Physician Assistant

## 2022-04-13 DIAGNOSIS — F320 Major depressive disorder, single episode, mild: Secondary | ICD-10-CM

## 2022-04-14 DIAGNOSIS — F32A Depression, unspecified: Secondary | ICD-10-CM

## 2022-04-14 DIAGNOSIS — I10 Essential (primary) hypertension: Secondary | ICD-10-CM | POA: Diagnosis not present

## 2022-04-14 DIAGNOSIS — J449 Chronic obstructive pulmonary disease, unspecified: Secondary | ICD-10-CM

## 2022-04-17 DIAGNOSIS — J449 Chronic obstructive pulmonary disease, unspecified: Secondary | ICD-10-CM | POA: Diagnosis not present

## 2022-05-03 DIAGNOSIS — N329 Bladder disorder, unspecified: Secondary | ICD-10-CM | POA: Diagnosis not present

## 2022-05-06 ENCOUNTER — Ambulatory Visit (INDEPENDENT_AMBULATORY_CARE_PROVIDER_SITE_OTHER): Payer: Medicare Other

## 2022-05-06 VITALS — Wt 119.0 lb

## 2022-05-06 DIAGNOSIS — F418 Other specified anxiety disorders: Secondary | ICD-10-CM

## 2022-05-06 DIAGNOSIS — I10 Essential (primary) hypertension: Secondary | ICD-10-CM

## 2022-05-06 DIAGNOSIS — E440 Moderate protein-calorie malnutrition: Secondary | ICD-10-CM

## 2022-05-06 NOTE — Patient Instructions (Signed)
Visit Information  Thank you for taking time to visit with me today. Please don't hesitate to contact me if I can be of assistance to you before our next scheduled telephone appointment.  Following are the goals we discussed today:  Call MD for any concerns.   If you are experiencing a Mental Health or [place] or need someone to talk to, please call the Suicide and Crisis Lifeline: 988 call the [place] National Suicide Prevention Lifeline: 316-194-4045 or TTY: 401-840-8052 TTY (629) 042-7130) to talk to a trained counselor call 1-800-273-TALK (toll free, 24 hour hotline) call 911   The patient verbalized understanding of instructions, educational materials, and care plan provided today and DECLINED offer to receive copy of patient instructions, educational materials, and care plan. Tomasa Rand RN, BSN, CEN RN Case Freight forwarder - [employer]/gallery exhibitions officer Mobile: [phone]

## 2022-05-06 NOTE — Chronic Care Management (AMB) (Signed)
Chronic Care Management   CCM RN Visit Note  05/06/2022 Name: Brenda Patrick MRN: [id number] DOB: 1932/08/16  Subjective: Brenda Patrick is a 88 y.o. year old female who is a primary care patient of Marge Duncans, Hershal Coria. The care management team was consulted for assistance with disease management and care coordination needs.    Engaged with patient by telephone for follow up visit in response to provider referral for case management and/or care coordination services.   Consent to Services:  The patient was given information about Chronic Care Management services, agreed to services, and gave verbal consent prior to initiation of services.  Please see initial visit note for detailed documentation.   Patient agreed to services and verbal consent obtained.   Assessment: Review of patient past medical history, allergies, medications, health status, including review of consultants reports, laboratory and other test data, was performed as part of comprehensive evaluation and provision of chronic care management services.   SDOH (Social Determinants of Health) assessments and interventions performed:    CCM Care Plan  Allergies  Allergen Reactions   Ceftin [Cefuroxime] Diarrhea   Lisinopril Diarrhea    Outpatient Encounter Medications as of 05/06/2022  Medication Sig   acetaminophen (TYLENOL) 650 MG CR tablet Take 650 mg by mouth 2 (two) times daily.   albuterol (PROVENTIL) (2.5 MG/3ML) 0.083% nebulizer solution Use 1 Vial In Nebulizer Every 6 Hours As Needed For Wheezing or Shortness of Breath   Ascorbic Acid (VITAMIN C) 1000 MG tablet Take 1,000 mg by mouth daily.   atenolol (TENORMIN) 50 MG tablet TAKE 1 TABLET(50 MG) BY MOUTH DAILY   busPIRone (BUSPAR) 10 MG tablet TAKE 1 TABLET(10 MG) BY MOUTH THREE TIMES DAILY   fluticasone (FLONASE) 50 MCG/ACT nasal spray SHAKE LIQUID AND USE 1 SPRAY IN EACH NOSTRIL AT BEDTIME   GEMTESA 75 MG TABS Take 1 tablet by mouth daily.   LORazepam  (ATIVAN) 1 MG tablet Take 1 tablet (1 mg total) by mouth 2 (two) times daily as needed.   losartan-hydrochlorothiazide (HYZAAR) 100-12.5 MG tablet TAKE 1 TABLET BY MOUTH DAILY   Multiple Vitamins-Minerals (MULTIVITAMIN WOMEN 50+ PO) Take 1 tablet by mouth daily.   omeprazole (PRILOSEC) 40 MG capsule TAKE 1 CAPSULE(40 MG) BY MOUTH DAILY   pravastatin (PRAVACHOL) 40 MG tablet Take 40 mg by mouth daily as needed.   PREMARIN vaginal cream SMARTSIG:1 Vaginal Every Night   venlafaxine XR (EFFEXOR-XR) 37.5 MG 24 hr capsule TAKE 1 CAPSULE(37.5 MG) BY MOUTH DAILY WITH BREAKFAST   zinc gluconate 50 MG tablet Take 50 mg by mouth daily.   No facility-administered encounter medications on file as of 05/06/2022.    Patient Active Problem List   Diagnosis Date Noted   Vitamin D deficiency 03/24/2022   Depression with anxiety 03/24/2022   Atherosclerosis of aorta ([place]) 11/17/2021   Stress incontinence 11/25/2020   Gastroesophageal reflux disease with esophagitis 05/26/2020   Mixed hyperlipidemia 05/26/2020   Major depressive disorder, single episode, mild ([place]) 05/26/2020   OSA (obstructive sleep apnea) 05/26/2020   Chronic obstructive pulmonary disease ([place]) 05/26/2020   Moderate protein-calorie malnutrition ([place]) 05/26/2020   BMI 20.0-20.9, adult 05/26/2020   Essential hypertension 10/30/2019    Conditions to be addressed/monitored:HTN, COPD, Depression, and protein calorie malnutrition  Care Plan : RN Care Manager Plan of Care  Updates made by Thana Ates, RN since 05/06/2022 12:00 AM     Problem: No plan of care established for managment of chronic disease states ( COPD,  Depression, Hypertension, protein calorie malnutrition)   Priority: High     Long-Range Goal: Developement of plan of care for Chronic [place] states ( COPD, Depression, hypertension and protein calorie malnutrition)   Start Date: 11/24/2021  Expected End Date: 11/24/2022  Recent Progress: On track  Priority: High   Note:   Current Barriers:  Chronic Disease Management support and education needs related to HTN, COPD, Depression: depressed mood, and protein calorie malnutrition 05/06/2022  Patient reports she is doing well. Reports she is cleaning house.  Reports she is having company this weekend. States she has been doing vacation bible school all week and is in good spirits.  Reports she is breathing well. States when she uses her albuterol that she feels jittery.  Reports weight is stable and she is eating well.  Denies any problems with her medications. Denies any new concerns or problems today.  RNCM Clinical Goal(s):  Patient will demonstrate ongoing adherence to prescribed treatment plan for HTN, COPD, Depression, and protein calorie malnutrition as evidenced by patient report. continue to work with Consulting civil engineer and/or Social Worker to address care management and care coordination needs related to HTN, COPD, and Depression as evidenced by adherence to CM Team Scheduled appointments     through collaboration with Consulting civil engineer, provider, and care team.   Interventions: 1:1 collaboration with primary care provider regarding development and update of comprehensive plan of care as evidenced by provider attestation and co-signature Inter-disciplinary care team collaboration (see longitudinal plan of care) Evaluation of current treatment plan related to  self management and patient's adherence to plan as established by provider    COPD: (Status: Goal Met.) Long Term Goal  Reviewed medications with patient, including use of prescribed maintenance and rescue inhalers, and provided instruction on medication management and the importance of adherence Advised patient to self assesses COPD action plan zone and make appointment with provider if in the yellow zone for 48 hours without improvement Encouraged patient to call MD for changes in condition.  Encouraged patient to continue to use her oxygen (2L at  night) and her nebulizer as prescribed. Reviewed current symptoms. Occasional mild, non-productive cough. Advised this is likely normal but to call PCP with any new or worsening symptoms.  Goal met- encouraged patient to call her provider for any problems in the future.   Protein Calorie Malnutrition:  (Status: Goal Met.) Long Term Goal  Today's Vitals   05/06/22 0959  Weight: 119 lb (54 kg)    Evaluation of current treatment plan related to  protein calorie malnutrition , self-management and patient's adherence to plan as established by provider. Discussed diet and appetite Patient reports great improvement in appetite and is eating meals regularly. She will drink a supplement occasionally but overall feels that she is getting enough nutrition from her diet.  Discussed social support. She has friends/family that she sees often and eats with. This can be very helpful as meals are often a social time.  Encouraged to reach out to PCP with any new concerns Goal met  Hypertension: (Status: Goal Met.) Long Term Goal  Last practice recorded BP readings:  BP Readings from Last 3 Encounters:  03/24/22 122/82  01/19/22 120/70  11/26/21 122/62  Evaluation of current treatment plan related to hypertension self management and patient's adherence to plan as established by provider;   Reviewed medications with patient and discussed importance of compliance Encouraged patient to continue to self monitor BP and call MD for any abnormal readings Goal  Met  Depression/Grief Reaction:  (Status:  Goal Met.)  Long Term Goal Evaluation of current treatment plan related to Depression, self-management and patient's adherence to plan as established by provider. Discussed plans with patient for ongoing care management follow up and provided patient with direct contact information for care management team Encouraged patient to stay active and be involved in activities Goal met   Patient Goals/Self-Care  Activities: Take medications as prescribed   Perform all self care activities independently  Perform IADL's (shopping, preparing meals, housekeeping, managing finances) independently Continue to use your oxygen at night.  Patient will remain physically and socially active Continue to eat meals regularly and drink boost, ensure, etc as needed to supplement diet Call MD for any new concerns. Keep Follow up appointment with MD.        Plan:    Case management goals are met.     Next PCP appointment scheduled for: 09/30/2022 Tomasa Rand RN, BSN, CEN RN Case Manager - [facility] Mobile: [phone]

## 2022-05-14 DIAGNOSIS — J449 Chronic obstructive pulmonary disease, unspecified: Secondary | ICD-10-CM

## 2022-05-14 DIAGNOSIS — F4320 Adjustment disorder, unspecified: Secondary | ICD-10-CM | POA: Diagnosis not present

## 2022-05-14 DIAGNOSIS — I10 Essential (primary) hypertension: Secondary | ICD-10-CM | POA: Diagnosis not present

## 2022-05-17 DIAGNOSIS — J449 Chronic obstructive pulmonary disease, unspecified: Secondary | ICD-10-CM | POA: Diagnosis not present

## 2022-05-28 ENCOUNTER — Other Ambulatory Visit: Payer: Self-pay | Admitting: Legal Medicine

## 2022-05-28 ENCOUNTER — Other Ambulatory Visit: Payer: Self-pay | Admitting: Family Medicine

## 2022-05-28 DIAGNOSIS — F320 Major depressive disorder, single episode, mild: Secondary | ICD-10-CM

## 2022-06-04 DIAGNOSIS — J410 Simple chronic bronchitis: Secondary | ICD-10-CM | POA: Diagnosis not present

## 2022-06-04 DIAGNOSIS — J449 Chronic obstructive pulmonary disease, unspecified: Secondary | ICD-10-CM | POA: Diagnosis not present

## 2022-06-07 DIAGNOSIS — L820 Inflamed seborrheic keratosis: Secondary | ICD-10-CM | POA: Diagnosis not present

## 2022-06-07 DIAGNOSIS — L814 Other melanin hyperpigmentation: Secondary | ICD-10-CM | POA: Diagnosis not present

## 2022-06-14 ENCOUNTER — Ambulatory Visit (INDEPENDENT_AMBULATORY_CARE_PROVIDER_SITE_OTHER): Payer: Medicare Other | Admitting: Family Medicine

## 2022-06-14 ENCOUNTER — Encounter: Payer: Self-pay | Admitting: Family Medicine

## 2022-06-14 VITALS — BP 148/70 | HR 78 | Temp 97.3°F | Resp 18 | Ht 60.0 in | Wt 121.0 lb

## 2022-06-14 DIAGNOSIS — R059 Cough, unspecified: Secondary | ICD-10-CM | POA: Diagnosis not present

## 2022-06-14 DIAGNOSIS — J208 Acute bronchitis due to other specified organisms: Secondary | ICD-10-CM | POA: Diagnosis not present

## 2022-06-14 DIAGNOSIS — R051 Acute cough: Secondary | ICD-10-CM | POA: Diagnosis not present

## 2022-06-14 DIAGNOSIS — K449 Diaphragmatic hernia without obstruction or gangrene: Secondary | ICD-10-CM | POA: Diagnosis not present

## 2022-06-14 LAB — POC COVID19 BINAXNOW: SARS Coronavirus 2 Ag: NEGATIVE

## 2022-06-14 MED ORDER — AZITHROMYCIN 250 MG PO TABS: ORAL_TABLET | ORAL | 0 refills | Status: DC

## 2022-06-14 MED ORDER — TRIAMCINOLONE ACETONIDE 40 MG/ML IJ SUSP
80.0000 mg | Freq: Once | INTRAMUSCULAR | Status: AC
  Administered 2022-06-14: 80 mg via INTRAMUSCULAR

## 2022-06-14 MED ORDER — CEFTRIAXONE SODIUM 1 G IJ SOLR
1.0000 g | Freq: Once | INTRAMUSCULAR | Status: AC
  Administered 2022-06-14: 1 g via INTRAMUSCULAR

## 2022-06-14 MED ORDER — PREDNISONE 10 MG PO TABS: ORAL_TABLET | ORAL | 0 refills | Status: AC

## 2022-06-14 NOTE — Patient Instructions (Signed)
RX: azithromycin and prednisone.  Shot of Kenalog and Rocephin given.  Start on breztri 2 puffs twice daily.  Get Chest xray at the hospital.  Continue to use albuterol nebulizers up to 4 times a day as needed.

## 2022-06-14 NOTE — Progress Notes (Signed)
Acute Office Visit  Subjective:    Patient ID: Brenda Patrick, female    DOB: Feb 15, 1932, 88 y.o.   MRN: [id number]  Chief Complaint  Patient presents with   Cough    HPI: Patient is in today for a persistent cough which started 2 weeks ago.  She has been using Robitussin DM with very little relief.  Congestion. Dizziness.  Past Medical History:  Diagnosis Date   Acute bronchitis due to other specified organisms 04/25/2020   COPD (chronic obstructive pulmonary disease) ([place]) 05/26/2020   COVID-19 10/30/2019   Gastroesophageal reflux disease with esophagitis 05/26/2020   Hypertension    Major depressive disorder, single episode, mild ([place]) 05/26/2020   Mixed hyperlipidemia 05/26/2020   Moderate protein-calorie malnutrition ([place]) 05/26/2020   OSA (obstructive sleep apnea) 05/26/2020    Past Surgical History:  Procedure Laterality Date   ABDOMINAL HYSTERECTOMY     DUODENAL DIVERTICULECTOMY  04/09/1993   OVARIAN CYST REMOVAL      History reviewed. No pertinent family history.  Social History   Socioeconomic History   Marital status: Widowed    Spouse name: Not on file   Number of children: 0   Years of education: 48   Highest education level: Not on file  Occupational History   Occupation: Homemaker  Tobacco Use   Smoking status: Never   Smokeless tobacco: Never  Vaping Use   Vaping Use: Never used  Substance and Sexual Activity   Alcohol use: Never   Drug use: Never   Sexual activity: Not Currently  Other Topics Concern   Not on file  Social History Narrative   [employer]  44 years   Social Determinants of Health   Financial Resource Strain: Low Risk  (11/24/2021)   Overall Financial Resource Strain (CARDIA)    Difficulty of Paying Living Expenses: Not hard at all  Food Insecurity: No Food Insecurity (11/24/2021)   Hunger Vital Sign    Worried About Running Out of Food in the Last Year: Never true    [place] in the Last Year: Never true   Transportation Needs: No Transportation Needs (11/24/2021)   PRAPARE - Hydrologist (Medical): No    Lack of Transportation (Non-Medical): No  Physical Activity: Sufficiently Active (10/21/2020)   Exercise Vital Sign    Days of Exercise per Week: 5 days    Minutes of Exercise per Session: 30 min  Stress: No Stress Concern Present (11/24/2021)   [place]    Feeling of Stress : Not at all  Social Connections: Moderately Integrated (11/24/2021)   Social Connection and Isolation Panel [NHANES]    Frequency of Communication with Friends and Family: Three times a week    Frequency of Social Gatherings with Friends and Family: Once a week    Attends Religious Services: More than 4 times per year    Active Member of [employer] or Organizations: Yes    Attends Archivist Meetings: 1 to 4 times per year    Marital Status: Widowed  Intimate Partner Violence: Not At Risk (11/24/2021)   Humiliation, Afraid, Rape, and Kick questionnaire    Fear of Current or Ex-Partner: No    Emotionally Abused: No    Physically Abused: No    Sexually Abused: No    Outpatient Medications Prior to Visit  Medication Sig Dispense Refill   acetaminophen (TYLENOL) 650 MG CR tablet Take 650 mg by  mouth 2 (two) times daily.     albuterol (PROVENTIL) (2.5 MG/3ML) 0.083% nebulizer solution Use 1 Vial In Nebulizer Every 6 Hours As Needed For Wheezing or Shortness of Breath 90 mL 11   Ascorbic Acid (VITAMIN C) 1000 MG tablet Take 1,000 mg by mouth daily.     atenolol (TENORMIN) 50 MG tablet TAKE 1 TABLET(50 MG) BY MOUTH DAILY 90 tablet 2   busPIRone (BUSPAR) 10 MG tablet TAKE 1 TABLET(10 MG) BY MOUTH THREE TIMES DAILY 90 tablet 2   fluticasone (FLONASE) 50 MCG/ACT nasal spray SHAKE LIQUID AND USE 1 SPRAY IN EACH NOSTRIL AT BEDTIME 16 g 6   GEMTESA 75 MG TABS Take 1 tablet by mouth daily.     LORazepam (ATIVAN) 1 MG tablet  Take 1 tablet (1 mg total) by mouth 2 (two) times daily as needed. 60 tablet 1   losartan-hydrochlorothiazide (HYZAAR) 100-12.5 MG tablet TAKE 1 TABLET BY MOUTH DAILY 90 tablet 2   Multiple Vitamins-Minerals (MULTIVITAMIN WOMEN 50+ PO) Take 1 tablet by mouth daily.     omeprazole (PRILOSEC) 40 MG capsule TAKE 1 CAPSULE(40 MG) BY MOUTH DAILY 90 capsule 2   pravastatin (PRAVACHOL) 40 MG tablet TAKE 1 TABLET(40 MG) BY MOUTH DAILY 90 tablet 1   venlafaxine XR (EFFEXOR-XR) 37.5 MG 24 hr capsule TAKE 1 CAPSULE(37.5 MG) BY MOUTH DAILY WITH BREAKFAST 90 capsule 1   zinc gluconate 50 MG tablet Take 50 mg by mouth daily.     PREMARIN vaginal cream SMARTSIG:1 Vaginal Every Night (Patient not taking: Reported on 06/14/2022)     No facility-administered medications prior to visit.    Allergies  Allergen Reactions   Ceftin [Cefuroxime] Diarrhea   Lisinopril Diarrhea    Review of Systems  Constitutional:  Negative for chills, fatigue and fever.  HENT:  Positive for congestion. Negative for rhinorrhea and sore throat.   Respiratory:  Positive for cough and wheezing. Negative for shortness of breath.   Cardiovascular:  Negative for chest pain.  Gastrointestinal:  Negative for abdominal pain, constipation, diarrhea, nausea and vomiting.  Genitourinary:  Negative for dysuria and urgency.       Urinary incontinence with cough.  Musculoskeletal:  Negative for back pain and myalgias.  Neurological:  Positive for light-headedness. Negative for dizziness, weakness and headaches.  Psychiatric/Behavioral:  Positive for dysphoric mood. The patient is not nervous/anxious.        Objective:    Physical Exam Vitals reviewed.  Constitutional:      Appearance: Normal appearance.  HENT:     Right Ear: Tympanic membrane, ear canal and external ear normal.     Left Ear: Tympanic membrane, ear canal and external ear normal.     Nose: Nose normal.     Mouth/Throat:     Pharynx: Oropharynx is clear. No  oropharyngeal exudate or posterior oropharyngeal erythema.  Cardiovascular:     Rate and Rhythm: Normal rate and regular rhythm.     Heart sounds: Normal heart sounds. No murmur heard. Pulmonary:     Effort: Pulmonary effort is normal. No respiratory distress.     Breath sounds: Rhonchi present.  Lymphadenopathy:     Cervical: No cervical adenopathy.  Neurological:     Mental Status: She is alert and oriented to person, place, and time.  Psychiatric:        Mood and Affect: Mood normal.        Behavior: Behavior normal.     BP (!) 148/70   Pulse 78  Temp (!) 97.3 F (36.3 C)   Resp 18   Ht 5' (1.524 m)   Wt 121 lb (54.9 kg)   BMI 23.63 kg/m  Wt Readings from Last 3 Encounters:  06/14/22 121 lb (54.9 kg)  05/06/22 119 lb (54 kg)  03/24/22 121 lb (54.9 kg)    Health Maintenance Due  Topic Date Due   TETANUS/TDAP  Never done   Zoster Vaccines- Shingrix (1 of 2) Never done    There are no preventive care reminders to display for this patient.   Lab Results  Component Value Date   TSH 2.130 03/24/2022   Lab Results  Component Value Date   WBC 6.9 03/24/2022   HGB 12.1 03/24/2022   HCT 36.5 03/24/2022   MCV 100 (H) 03/24/2022   PLT 269 03/24/2022   Lab Results  Component Value Date   NA 142 03/24/2022   K 4.1 03/24/2022   CO2 26 03/24/2022   GLUCOSE 93 03/24/2022   BUN 20 03/24/2022   CREATININE 0.76 03/24/2022   BILITOT 0.5 03/24/2022   ALKPHOS 68 03/24/2022   AST 19 03/24/2022   ALT 13 03/24/2022   PROT 6.4 03/24/2022   ALBUMIN 4.3 03/24/2022   CALCIUM 9.7 03/24/2022   ANIONGAP 10 11/04/2019   EGFR 76 03/24/2022   Lab Results  Component Value Date   CHOL 191 03/24/2022   Lab Results  Component Value Date   HDL 64 03/24/2022   Lab Results  Component Value Date   LDLCALC 115 (H) 03/24/2022   Lab Results  Component Value Date   TRIG 63 03/24/2022   Lab Results  Component Value Date   CHOLHDL 3.0 03/24/2022   No results found for:  "HGBA1C"     Assessment & Plan:   Problem List Items Addressed This Visit       Respiratory   Acute bronchitis due to other specified organisms    Concerning for pneumonia.  RX: azithromycin and prednisone.  Shot of Kenalog and Rocephin given.  Start on breztri 2 puffs twice daily.  Get Chest xray at the hospital.  Continue to use albuterol nebulizers up to 4 times a day as needed.       Relevant Medications   azithromycin (ZITHROMAX) 250 MG tablet   predniSONE (DELTASONE) 10 MG tablet   Other Relevant Orders   DG Chest 2 View     Other   Acute cough - Primary    Order cxr.       Relevant Orders   POC COVID-19 (Completed)   Meds ordered this encounter  Medications   triamcinolone acetonide (KENALOG-40) injection 80 mg   cefTRIAXone (ROCEPHIN) injection 1 g   azithromycin (ZITHROMAX) 250 MG tablet    Sig: 2 DAILY FOR FIRST DAY, THEN DECREASE TO ONE DAILY FOR 4 MORE DAYS.    Dispense:  6 tablet    Refill:  0   predniSONE (DELTASONE) 10 MG tablet    Sig: Take 6 tablets (60 mg total) by mouth daily with breakfast for 1 day, THEN 5 tablets (50 mg total) daily with breakfast for 1 day, THEN 4 tablets (40 mg total) daily with breakfast for 1 day, THEN 3 tablets (30 mg total) daily with breakfast for 1 day, THEN 2 tablets (20 mg total) daily with breakfast for 1 day, THEN 1 tablet (10 mg total) daily with breakfast for 1 day.    Dispense:  21 tablet    Refill:  0    Orders  Placed This Encounter  Procedures   DG Chest 2 View   POC COVID-19     Follow-up: Return if symptoms worsen or fail to improve.  An After Visit Summary was printed and given to the patient.    Rochel Brome, MD [facility] [phone]

## 2022-06-14 NOTE — Assessment & Plan Note (Signed)
Order cxr. 

## 2022-06-14 NOTE — Assessment & Plan Note (Signed)
Concerning for pneumonia.  RX: azithromycin and prednisone.  Shot of Kenalog and Rocephin given.  Start on breztri 2 puffs twice daily.  Get Chest xray at the hospital.  Continue to use albuterol nebulizers up to 4 times a day as needed.

## 2022-06-17 DIAGNOSIS — J449 Chronic obstructive pulmonary disease, unspecified: Secondary | ICD-10-CM | POA: Diagnosis not present

## 2022-06-22 ENCOUNTER — Other Ambulatory Visit: Payer: Self-pay

## 2022-06-22 DIAGNOSIS — J208 Acute bronchitis due to other specified organisms: Secondary | ICD-10-CM

## 2022-07-18 DIAGNOSIS — J449 Chronic obstructive pulmonary disease, unspecified: Secondary | ICD-10-CM | POA: Diagnosis not present

## 2022-07-24 ENCOUNTER — Other Ambulatory Visit: Payer: Self-pay | Admitting: Physician Assistant

## 2022-07-24 DIAGNOSIS — F418 Other specified anxiety disorders: Secondary | ICD-10-CM

## 2022-07-28 ENCOUNTER — Other Ambulatory Visit: Payer: Self-pay | Admitting: Physician Assistant

## 2022-07-28 DIAGNOSIS — F320 Major depressive disorder, single episode, mild: Secondary | ICD-10-CM

## 2022-07-28 DIAGNOSIS — F418 Other specified anxiety disorders: Secondary | ICD-10-CM

## 2022-07-28 DIAGNOSIS — K2100 Gastro-esophageal reflux disease with esophagitis, without bleeding: Secondary | ICD-10-CM

## 2022-07-28 MED ORDER — PRAVASTATIN SODIUM 40 MG PO TABS: ORAL_TABLET | ORAL | 1 refills | Status: DC

## 2022-07-28 MED ORDER — VENLAFAXINE HCL ER 37.5 MG PO CP24: ORAL_CAPSULE | ORAL | 1 refills | Status: DC

## 2022-07-28 MED ORDER — LOSARTAN POTASSIUM-HCTZ 100-12.5 MG PO TABS: 1.0000 | ORAL_TABLET | Freq: Every day | ORAL | 1 refills | Status: DC

## 2022-07-28 MED ORDER — OMEPRAZOLE 40 MG PO CPDR: DELAYED_RELEASE_CAPSULE | ORAL | 1 refills | Status: DC

## 2022-07-28 MED ORDER — BUSPIRONE HCL 10 MG PO TABS: ORAL_TABLET | ORAL | 1 refills | Status: DC

## 2022-07-28 MED ORDER — FLUTICASONE PROPIONATE 50 MCG/ACT NA SUSP: NASAL | 6 refills | Status: DC

## 2022-07-28 MED ORDER — LORAZEPAM 1 MG PO TABS: ORAL_TABLET | ORAL | 0 refills | Status: DC

## 2022-07-28 MED ORDER — ATENOLOL 50 MG PO TABS: ORAL_TABLET | ORAL | 1 refills | Status: DC

## 2022-08-02 ENCOUNTER — Other Ambulatory Visit: Payer: Self-pay | Admitting: Physician Assistant

## 2022-08-02 DIAGNOSIS — F320 Major depressive disorder, single episode, mild: Secondary | ICD-10-CM

## 2022-08-17 DIAGNOSIS — J449 Chronic obstructive pulmonary disease, unspecified: Secondary | ICD-10-CM | POA: Diagnosis not present

## 2022-08-20 DIAGNOSIS — J410 Simple chronic bronchitis: Secondary | ICD-10-CM | POA: Diagnosis not present

## 2022-08-20 DIAGNOSIS — J449 Chronic obstructive pulmonary disease, unspecified: Secondary | ICD-10-CM | POA: Diagnosis not present

## 2022-08-26 ENCOUNTER — Other Ambulatory Visit: Payer: Self-pay | Admitting: Legal Medicine

## 2022-08-26 DIAGNOSIS — K2100 Gastro-esophageal reflux disease with esophagitis, without bleeding: Secondary | ICD-10-CM

## 2022-09-15 DIAGNOSIS — J449 Chronic obstructive pulmonary disease, unspecified: Secondary | ICD-10-CM | POA: Diagnosis not present

## 2022-09-15 DIAGNOSIS — J410 Simple chronic bronchitis: Secondary | ICD-10-CM | POA: Diagnosis not present

## 2022-09-17 DIAGNOSIS — J449 Chronic obstructive pulmonary disease, unspecified: Secondary | ICD-10-CM | POA: Diagnosis not present

## 2022-09-23 ENCOUNTER — Other Ambulatory Visit: Payer: Self-pay | Admitting: Physician Assistant

## 2022-09-23 DIAGNOSIS — F418 Other specified anxiety disorders: Secondary | ICD-10-CM

## 2022-09-30 ENCOUNTER — Ambulatory Visit (INDEPENDENT_AMBULATORY_CARE_PROVIDER_SITE_OTHER): Payer: Medicare Other | Admitting: Physician Assistant

## 2022-09-30 ENCOUNTER — Encounter: Payer: Self-pay | Admitting: Physician Assistant

## 2022-09-30 VITALS — BP 122/70 | HR 72 | Temp 97.3°F | Ht 60.0 in | Wt 118.0 lb

## 2022-09-30 DIAGNOSIS — E559 Vitamin D deficiency, unspecified: Secondary | ICD-10-CM | POA: Diagnosis not present

## 2022-09-30 DIAGNOSIS — E782 Mixed hyperlipidemia: Secondary | ICD-10-CM

## 2022-09-30 DIAGNOSIS — I10 Essential (primary) hypertension: Secondary | ICD-10-CM

## 2022-09-30 DIAGNOSIS — R5383 Other fatigue: Secondary | ICD-10-CM | POA: Diagnosis not present

## 2022-09-30 DIAGNOSIS — F418 Other specified anxiety disorders: Secondary | ICD-10-CM | POA: Diagnosis not present

## 2022-09-30 MED ORDER — VENLAFAXINE HCL ER 75 MG PO CP24: 75.0000 mg | ORAL_CAPSULE | Freq: Every day | ORAL | 5 refills | Status: DC

## 2022-09-30 NOTE — Progress Notes (Signed)
Subjective:  Patient ID: Brenda Patrick, female    DOB: November 08, 1932  Age: 88 y.o. MRN: [id number]  Chief Complaint  Patient presents with   Hypertension      Pt with history of hypertension - She is currently on tenormin '50mg'$  qd and hyzaar 100/12.'5mg'$   She denies chest pain/sob/edema/palpitations  Pt with history of hyperlipidemia - she is currently on pravachol '40mg'$  qd  -she is due for labwork States she does try to watch her diet  Pt with history of depression with anxiety - pt is currently taking effexor 37.'5mg'$ , buspar '10mg'$  and lorazepam as needed.  She states that she has been having some breakthrough depressive symptoms - especially feeling alone around holidays - states she feels that way most days  Pt with history of GERD - stable on prilosec '40mg'$  qd  Pt with history of stress incontinence - doing well on Gemtesa Current Outpatient Medications on File Prior to Visit  Medication Sig Dispense Refill   acetaminophen (TYLENOL) 650 MG CR tablet Take 650 mg by mouth 2 (two) times daily.     albuterol (PROVENTIL) (2.5 MG/3ML) 0.083% nebulizer solution Use 1 Vial In Nebulizer Every 6 Hours As Needed For Wheezing or Shortness of Breath 90 mL 11   Ascorbic Acid (VITAMIN C) 1000 MG tablet Take 1,000 mg by mouth daily.     atenolol (TENORMIN) 50 MG tablet TAKE 1 TABLET(50 MG) BY MOUTH DAILY 90 tablet 1   busPIRone (BUSPAR) 10 MG tablet TAKE 1 TABLET(10 MG) BY MOUTH THREE TIMES DAILY 90 tablet 1   fluticasone (FLONASE) 50 MCG/ACT nasal spray SHAKE LIQUID AND USE 1 SPRAY IN EACH NOSTRIL AT BEDTIME 16 g 6   GEMTESA 75 MG TABS Take 1 tablet by mouth daily.     LORazepam (ATIVAN) 1 MG tablet TAKE 1 TABLET(1 MG) BY MOUTH TWICE DAILY AS NEEDED 60 tablet 1   losartan-hydrochlorothiazide (HYZAAR) 100-12.5 MG tablet TAKE 1 TABLET BY MOUTH DAILY 90 tablet 1   Multiple Vitamins-Minerals (MULTIVITAMIN WOMEN 50+ PO) Take 1 tablet by mouth daily.     omeprazole (PRILOSEC) 40 MG capsule TAKE 1  CAPSULE(40 MG) BY MOUTH DAILY 90 capsule 1   pravastatin (PRAVACHOL) 40 MG tablet TAKE 1 TABLET(40 MG) BY MOUTH DAILY 90 tablet 1   zinc gluconate 50 MG tablet Take 50 mg by mouth daily.     No current facility-administered medications on file prior to visit.   Past Medical History:  Diagnosis Date   Acute bronchitis due to other specified organisms 04/25/2020   COPD (chronic obstructive pulmonary disease) ([place]) 05/26/2020   COVID-19 10/30/2019   Gastroesophageal reflux disease with esophagitis 05/26/2020   Hypertension    Major depressive disorder, single episode, mild ([place]) 05/26/2020   Mixed hyperlipidemia 05/26/2020   Moderate protein-calorie malnutrition ([place]) 05/26/2020   OSA (obstructive sleep apnea) 05/26/2020   Past Surgical History:  Procedure Laterality Date   ABDOMINAL HYSTERECTOMY     DUODENAL DIVERTICULECTOMY  04/09/1993   OVARIAN CYST REMOVAL      No family history on file. Social History   Socioeconomic History   Marital status: Widowed    Spouse name: Not on file   Number of children: 0   Years of education: 75   Highest education level: Not on file  Occupational History   Occupation: Homemaker  Tobacco Use   Smoking status: Never   Smokeless tobacco: Never  Vaping Use   Vaping Use: Never used  Substance and Sexual Activity  Alcohol use: Never   Drug use: Never   Sexual activity: Not Currently  Other Topics Concern   Not on file  Social History Narrative   [employer]  44 years   Social Determinants of Health   Financial Resource Strain: Low Risk  (11/24/2021)   Overall Financial Resource Strain (CARDIA)    Difficulty of Paying Living Expenses: Not hard at all  Food Insecurity: No Food Insecurity (11/24/2021)   Hunger Vital Sign    Worried About Running Out of Food in the Last Year: Never true    Ran Out of Food in the Last Year: Never true  Transportation Needs: No Transportation Needs (11/24/2021)   PRAPARE - Radiographer, therapeutic (Medical): No    Lack of Transportation (Non-Medical): No  Physical Activity: Sufficiently Active (10/21/2020)   Exercise Vital Sign    Days of Exercise per Week: 5 days    Minutes of Exercise per Session: 30 min  Stress: No Stress Concern Present (11/24/2021)   [place]    Feeling of Stress : Not at all  Social Connections: Moderately Integrated (11/24/2021)   Social Connection and Isolation Panel [NHANES]    Frequency of Communication with Friends and Family: Three times a week    Frequency of Social Gatherings with Friends and Family: Once a week    Attends Religious Services: More than 4 times per year    Active Member of [employer] or Organizations: Yes    Attends Archivist Meetings: 1 to 4 times per year    Marital Status: Widowed    Objective:   PHYSICAL EXAM:   VS: BP 122/70 (BP Location: Left Arm, Patient Position: Sitting, Cuff Size: Normal)   Pulse 72   Temp (!) 97.3 F (36.3 C) (Temporal)   Ht 5' (1.524 m)   Wt 118 lb (53.5 kg)   SpO2 94%   BMI 23.05 kg/m   GEN: Well nourished, well developed, in no acute distress  Cardiac: RRR; no murmurs, rubs, or gallops,no edema -  Respiratory:  normal respiratory rate and pattern with no distress - normal breath sounds with no rales, rhonchi, wheezes or rubs MS: no deformity or atrophy  Skin: warm and dry, no rash   Psych: euthymic mood, appropriate affect and demeanor    09/30/2022    8:28 AM 06/14/2022   10:53 AM 03/24/2022    9:50 AM 11/24/2021   10:38 AM 11/17/2021    8:32 AM  Depression screen PHQ 2/9  Decreased Interest 2 0 0 1 1  Down, Depressed, Hopeless 1 1 0 1 1  PHQ - 2 Score 3 1 0 2 2  Altered sleeping 0  0  0  Tired, decreased energy 0  0  1  Change in appetite 0  0  0  Feeling bad or failure about yourself  0  0  1  Trouble concentrating 0  0  0  Moving slowly or fidgety/restless 0  0  0  Suicidal thoughts 0  0  0   PHQ-9 Score 3  0  4  Difficult doing work/chores Not difficult at all    Not difficult at all    Lab Results  Component Value Date   WBC 6.9 03/24/2022   HGB 12.1 03/24/2022   HCT 36.5 03/24/2022   PLT 269 03/24/2022   GLUCOSE 93 03/24/2022   CHOL 191 03/24/2022   TRIG 63 03/24/2022  HDL 64 03/24/2022   LDLCALC 115 (H) 03/24/2022   ALT 13 03/24/2022   AST 19 03/24/2022   NA 142 03/24/2022   K 4.1 03/24/2022   CL 101 03/24/2022   CREATININE 0.76 03/24/2022   BUN 20 03/24/2022   CO2 26 03/24/2022   TSH 2.130 03/24/2022      Assessment & Plan:   Problem List Items Addressed This Visit       Cardiovascular and Mediastinum   Essential hypertension - Primary   Relevant Orders   CBC with Differential/Platelet   Comprehensive metabolic panel   Lipid panel Continue current meds     Other   Mixed hyperlipidemia   Relevant Orders   Lipid panel Continue meds Watch diet   Vitamin D deficiency   Relevant Orders   VITAMIN D 25 Hydroxy (Vit-D Deficiency, Fractures)    Depression with anxiety   Relevant Medications   venlafaxine XR (EFFEXOR XR) 75 MG 24 hr capsule increased from 37.'5mg'$   Continue other meds as directed   Other Relevant Orders   TSH   Other Visit Diagnoses     Other fatigue       Relevant Orders   CBC with Differential/Platelet   Comprehensive metabolic panel   TSH  GERD Continue omeprazole     .  Meds ordered this encounter  Medications   venlafaxine XR (EFFEXOR XR) 75 MG 24 hr capsule    Sig: Take 1 capsule (75 mg total) by mouth daily with breakfast.    Dispense:  30 capsule    Refill:  5    Order Specific Question:   Supervising Provider    AnswerShelton Silvas     Orders Placed This Encounter  Procedures   CBC with Differential/Platelet   Comprehensive metabolic panel   TSH   Lipid panel   VITAMIN D 25 Hydroxy (Vit-D Deficiency, Fractures)     Follow-up: Return in about 6 months (around 03/31/2023) for fasting  with me ---- schedule [facility] wellness with Maudie Mercury per phone call appt.  An After Visit Summary was printed and given to the patient.  Yetta Flock [facility] [phone]

## 2022-10-01 LAB — COMPREHENSIVE METABOLIC PANEL
ALT: 13 IU/L (ref 0–32)
AST: 18 IU/L (ref 0–40)
Albumin/Globulin Ratio: 1.9 (ref 1.2–2.2)
Albumin: 4.4 g/dL (ref 3.7–4.7)
Alkaline Phosphatase: 67 IU/L (ref 44–121)
BUN/Creatinine Ratio: 25 (ref 12–28)
BUN: 22 mg/dL (ref 8–27)
Bilirubin Total: 0.5 mg/dL (ref 0.0–1.2)
CO2: 27 mmol/L (ref 20–29)
Calcium: 10.3 mg/dL (ref 8.7–10.3)
Chloride: 101 mmol/L (ref 96–106)
Creatinine, Ser: 0.87 mg/dL (ref 0.57–1.00)
Globulin, Total: 2.3 g/dL (ref 1.5–4.5)
Glucose: 92 mg/dL (ref 70–99)
Potassium: 4.8 mmol/L (ref 3.5–5.2)
Sodium: 141 mmol/L (ref 134–144)
Total Protein: 6.7 g/dL (ref 6.0–8.5)
eGFR: 64 mL/min/{1.73_m2} (ref >59)

## 2022-10-01 LAB — CBC WITH DIFFERENTIAL/PLATELET
Basophils Absolute: 0.1 10*3/uL (ref 0.0–0.2)
Basos: 1 %
EOS (ABSOLUTE): 0.3 10*3/uL (ref 0.0–0.4)
Eos: 5 %
Hematocrit: 37.4 % (ref 34.0–46.6)
Hemoglobin: 12.6 g/dL (ref 11.1–15.9)
Immature Grans (Abs): 0 10*3/uL (ref 0.0–0.1)
Immature Granulocytes: 0 %
Lymphocytes Absolute: 2.5 10*3/uL (ref 0.7–3.1)
Lymphs: 36 %
MCH: 33.4 pg — ABNORMAL HIGH (ref 26.6–33.0)
MCHC: 33.7 g/dL (ref 31.5–35.7)
MCV: 99 fL — ABNORMAL HIGH (ref 79–97)
Monocytes Absolute: 0.6 10*3/uL (ref 0.1–0.9)
Monocytes: 9 %
Neutrophils Absolute: 3.4 10*3/uL (ref 1.4–7.0)
Neutrophils: 49 %
Platelets: 260 10*3/uL (ref 150–450)
RBC: 3.77 x10E6/uL (ref 3.77–5.28)
RDW: 11.3 % — ABNORMAL LOW (ref 11.7–15.4)
WBC: 6.8 10*3/uL (ref 3.4–10.8)

## 2022-10-01 LAB — TSH: TSH: 3.39 u[IU]/mL (ref 0.450–4.500)

## 2022-10-01 LAB — LIPID PANEL
Chol/HDL Ratio: 3.7 ratio (ref 0.0–4.4)
Cholesterol, Total: 211 mg/dL — ABNORMAL HIGH (ref 100–199)
HDL: 57 mg/dL (ref >39)
LDL Chol Calc (NIH): 139 mg/dL — ABNORMAL HIGH (ref 0–99)
Triglycerides: 82 mg/dL (ref 0–149)
VLDL Cholesterol Cal: 15 mg/dL (ref 5–40)

## 2022-10-01 LAB — CARDIOVASCULAR RISK ASSESSMENT

## 2022-10-01 LAB — VITAMIN D 25 HYDROXY (VIT D DEFICIENCY, FRACTURES): Vit D, 25-Hydroxy: 95.7 ng/mL (ref 30.0–100.0)

## 2022-10-05 ENCOUNTER — Other Ambulatory Visit: Payer: Self-pay | Admitting: Physician Assistant

## 2022-10-05 ENCOUNTER — Telehealth: Payer: Self-pay

## 2022-10-05 DIAGNOSIS — E782 Mixed hyperlipidemia: Secondary | ICD-10-CM

## 2022-10-05 MED ORDER — ATORVASTATIN CALCIUM 40 MG PO TABS: 40.0000 mg | ORAL_TABLET | Freq: Every day | ORAL | 1 refills | Status: DC

## 2022-10-05 NOTE — Telephone Encounter (Signed)
Patient called back with questions about her lab results.  She has been taking her pravachol daily as directed.  I reviewed the lab results.

## 2022-10-06 ENCOUNTER — Ambulatory Visit (INDEPENDENT_AMBULATORY_CARE_PROVIDER_SITE_OTHER): Payer: Medicare Other

## 2022-10-06 DIAGNOSIS — Z0000 Encounter for general adult medical examination without abnormal findings: Secondary | ICD-10-CM

## 2022-10-06 NOTE — Patient Instructions (Signed)
Health Maintenance After Age 86 After age 86, you are at a higher risk for certain long-term diseases and infections as well as injuries from falls. Falls are a major cause of broken bones and head injuries in people who are older than age 86. Getting regular preventive care can help to keep you healthy and well. Preventive care includes getting regular testing and making lifestyle changes as recommended by your health care provider. Talk with your health care provider about: Which screenings and tests you should have. A screening is a test that checks for a disease when you have no symptoms. A diet and exercise plan that is right for you. What should I know about screenings and tests to prevent falls? Screening and testing are the best ways to find a health problem early. Early diagnosis and treatment give you the best chance of managing medical conditions that are common after age 86. Certain conditions and lifestyle choices may make you more likely to have a fall. Your health care provider may recommend: Regular vision checks. Poor vision and conditions such as cataracts can make you more likely to have a fall. If you wear glasses, make sure to get your prescription updated if your vision changes. Medicine review. Work with your health care provider to regularly review all of the medicines you are taking, including over-the-counter medicines. Ask your health care provider about any side effects that may make you more likely to have a fall. Tell your health care provider if any medicines that you take make you feel dizzy or sleepy. Strength and balance checks. Your health care provider may recommend certain tests to check your strength and balance while standing, walking, or changing positions. Foot health exam. Foot pain and numbness, as well as not wearing proper footwear, can make you more likely to have a fall. Screenings, including: Osteoporosis screening. Osteoporosis is a condition that causes  the bones to get weaker and break more easily. Blood pressure screening. Blood pressure changes and medicines to control blood pressure can make you feel dizzy. Depression screening. You may be more likely to have a fall if you have a fear of falling, feel depressed, or feel unable to do activities that you used to do. Alcohol use screening. Using too much alcohol can affect your balance and may make you more likely to have a fall. Follow these instructions at home: Lifestyle Do not drink alcohol if: Your health care provider tells you not to drink. If you drink alcohol: Limit how much you have to: 0-1 drink a day for women. 0-2 drinks a day for men. Know how much alcohol is in your drink. In the [place] one drink equals one 12 oz bottle of beer (355 mL), one 5 oz glass of wine (148 mL), or one 1 oz glass of hard liquor (44 mL). Do not use any products that contain nicotine or tobacco. These products include cigarettes, chewing tobacco, and vaping devices, such as e-cigarettes. If you need help quitting, ask your health care provider. Activity  Follow a regular exercise program to stay fit. This will help you maintain your balance. Ask your health care provider what types of exercise are appropriate for you. If you need a cane or walker, use it as recommended by your health care provider. Wear supportive shoes that have nonskid soles. Safety  Remove any tripping hazards, such as rugs, cords, and clutter. Install safety equipment such as grab bars in bathrooms and safety rails on stairs. Keep rooms and walkways   well-lit. General instructions Talk with your health care provider about your risks for falling. Tell your health care provider if: You fall. Be sure to tell your health care provider about all falls, even ones that seem minor. You feel dizzy, tiredness (fatigue), or off-balance. Take over-the-counter and prescription medicines only as told by your health care provider. These include  supplements. Eat a healthy diet and maintain a healthy weight. A healthy diet includes low-fat dairy products, low-fat (lean) meats, and fiber from whole grains, beans, and lots of fruits and vegetables. Stay current with your vaccines. Schedule regular health, dental, and eye exams. Summary Having a healthy lifestyle and getting preventive care can help to protect your health and wellness after age 86. Screening and testing are the best way to find a health problem early and help you avoid having a fall. Early diagnosis and treatment give you the best chance for managing medical conditions that are more common for people who are older than age 86. Falls are a major cause of broken bones and head injuries in people who are older than age 86. Take precautions to prevent a fall at home. Work with your health care provider to learn what changes you can make to improve your health and wellness and to prevent falls. This information is not intended to replace advice given to you by your health care provider. Make sure you discuss any questions you have with your health care provider. Document Revised: 03/23/2021 Document Reviewed: 03/23/2021 Elsevier Patient Education  2023 [employer]  

## 2022-10-06 NOTE — Progress Notes (Signed)
Subjective:   Brenda Patrick is a 88 y.o. female who presents for Medicare Annual (Subsequent) preventive examination. I connected with  Brenda Patrick on 10/06/22 by a audio enabled telemedicine application and verified that I am speaking with the correct person using two identifiers.  Patient Location: Home  Provider Location: Office/Clinic  I discussed the limitations of evaluation and management by telemedicine. The patient expressed understanding and agreed to proceed.  Cardiac Risk Factors include: advanced age (>77mn, >>36women);dyslipidemia     Objective:    There were no vitals filed for this visit. There is no height or weight on file to calculate BMI.     11/24/2021   10:40 AM 10/21/2020    3:23 PM 10/30/2019   10:51 AM  Advanced Directives  Does Patient Have a Medical Advance Directive? Yes Yes Yes  Type of [place] Living [place] Does patient want to make changes to medical advance directive? No - Patient declined  No - Patient declined  Copy of [place]? No - copy requested  No - copy requested  Would patient like information on creating a medical advance directive?   No - Patient declined    Current Medications (verified) Outpatient Encounter Medications as of 10/06/2022  Medication Sig   acetaminophen (TYLENOL) 650 MG CR tablet Take 650 mg by mouth 2 (two) times daily.   albuterol (PROVENTIL) (2.5 MG/3ML) 0.083% nebulizer solution Use 1 Vial In Nebulizer Every 6 Hours As Needed For Wheezing or Shortness of Breath   Ascorbic Acid (VITAMIN C) 1000 MG tablet Take 1,000 mg by mouth daily.   atenolol (TENORMIN) 50 MG tablet TAKE 1 TABLET(50 MG) BY MOUTH DAILY   atorvastatin (LIPITOR) 40 MG tablet Take 1 tablet (40 mg total) by mouth daily.   busPIRone (BUSPAR) 10 MG tablet TAKE 1 TABLET(10 MG) BY MOUTH THREE TIMES DAILY   fluticasone (FLONASE) 50  MCG/ACT nasal spray SHAKE LIQUID AND USE 1 SPRAY IN EACH NOSTRIL AT BEDTIME   GEMTESA 75 MG TABS Take 1 tablet by mouth daily.   LORazepam (ATIVAN) 1 MG tablet TAKE 1 TABLET(1 MG) BY MOUTH TWICE DAILY AS NEEDED   losartan-hydrochlorothiazide (HYZAAR) 100-12.5 MG tablet TAKE 1 TABLET BY MOUTH DAILY   Multiple Vitamins-Minerals (MULTIVITAMIN WOMEN 50+ PO) Take 1 tablet by mouth daily.   omeprazole (PRILOSEC) 40 MG capsule TAKE 1 CAPSULE(40 MG) BY MOUTH DAILY   venlafaxine XR (EFFEXOR XR) 75 MG 24 hr capsule Take 1 capsule (75 mg total) by mouth daily with breakfast.   zinc gluconate 50 MG tablet Take 50 mg by mouth daily.   No facility-administered encounter medications on file as of 10/06/2022.    Allergies (verified) Ceftin [cefuroxime] and Lisinopril   History: Past Medical History:  Diagnosis Date   Acute bronchitis due to other specified organisms 04/25/2020   COPD (chronic obstructive pulmonary disease) ([place] 05/26/2020   COVID-19 10/30/2019   Gastroesophageal reflux disease with esophagitis 05/26/2020   Hypertension    Major depressive disorder, single episode, mild ([place] 05/26/2020   Mixed hyperlipidemia 05/26/2020   Moderate protein-calorie malnutrition ([place] 05/26/2020   OSA (obstructive sleep apnea) 05/26/2020   Past Surgical History:  Procedure Laterality Date   ABDOMINAL HYSTERECTOMY     CYSTOSCOPY  01/15/2022   with bladder lesion removal, bladder papilloma   DUODENAL DIVERTICULECTOMY  04/09/1993   OVARIAN CYST REMOVAL     History reviewed. No pertinent  family history. Social History   Socioeconomic History   Marital status: Widowed    Spouse name: Not on file   Number of children: 0   Years of education: 16   Highest education level: Not on file  Occupational History   Occupation: Homemaker  Tobacco Use   Smoking status: Never   Smokeless tobacco: Never  Vaping Use   Vaping Use: Never used  Substance and Sexual Activity   Alcohol use: Never   Drug use:  Never   Sexual activity: Not Currently  Other Topics Concern   Not on file  Social History Narrative   [employer]  44 years   Social Determinants of Health   Financial Resource Strain: Low Risk  (11/24/2021)   Overall Financial Resource Strain (CARDIA)    Difficulty of Paying Living Expenses: Not hard at all  Food Insecurity: No Food Insecurity (11/24/2021)   Hunger Vital Sign    Worried About Running Out of Food in the Last Year: Never true    [place] in the Last Year: Never true  Transportation Needs: No Transportation Needs (11/24/2021)   PRAPARE - Hydrologist (Medical): No    Lack of Transportation (Non-Medical): No  Physical Activity: Sufficiently Active (10/21/2020)   Exercise Vital Sign    Days of Exercise per Week: 5 days    Minutes of Exercise per Session: 30 min  Stress: No Stress Concern Present (11/24/2021)   [place]    Feeling of Stress : Not at all  Social Connections: Moderately Integrated (11/24/2021)   Social Connection and Isolation Panel [NHANES]    Frequency of Communication with Friends and Family: Three times a week    Frequency of Social Gatherings with Friends and Family: Once a week    Attends Religious Services: More than 4 times per year    Active Member of [employer] or Organizations: Yes    Attends Archivist Meetings: 1 to 4 times per year    Marital Status: Widowed    Tobacco Counseling Counseling given: Not Answered   Clinical Intake:  Pre-visit preparation completed: Yes Pain : No/denies pain   BMI - recorded: 23.05 Nutritional Status: BMI of 19-24  Normal Nutritional Risks: None Diabetes: No How often do you need to have someone help you when you read instructions, pamphlets, or other written materials from your doctor or pharmacy?: 2 - Rarely Interpreter Needed?: No    Activities of Daily Living    10/06/2022    9:26 AM  11/17/2021    8:31 AM  In your present state of health, do you have any difficulty performing the following activities:  Hearing? 0 0  Vision? 0 0  Difficulty concentrating or making decisions? 0 0  Walking or climbing stairs? 0 0  Dressing or bathing? 0 0  Doing errands, shopping? 0 0  Preparing Food and eating ? N   Using the Toilet? N   In the past six months, have you accidently leaked urine? Y   Do you have problems with loss of bowel control? N   Managing your Medications? N   Managing your Finances? N   Housekeeping or managing your Housekeeping? N     Patient Care Team: Marge Duncans, Hershal Coria as PCP - General (Physician Assistant)     Assessment:   This is a routine wellness examination for Brenda Patrick.  Hearing/Vision screen No results found.  Dietary issues and  exercise activities discussed: Current Exercise Habits: The patient does not participate in regular exercise at present, Exercise limited by: None identified  Depression Screen    10/06/2022    9:25 AM 09/30/2022    8:28 AM 06/14/2022   10:53 AM 03/24/2022    9:50 AM 11/24/2021   10:38 AM 11/17/2021    8:32 AM 06/16/2021   10:22 AM  PHQ 2/9 Scores  PHQ - 2 Score '3 3 1 '$ 0 '2 2 2  '$ PHQ- 9 Score 3 3  0  4 5    Fall Risk    10/06/2022    9:25 AM 06/14/2022   10:53 AM 03/24/2022    9:50 AM 11/24/2021   10:38 AM 11/17/2021    8:31 AM  Fall Risk   Falls in the past year? 0 0 0 0 0  Number falls in past yr: 0 0 0 0 0  Injury with Fall? 0 0 0 0 0  Risk for fall due to : No Fall Risks No Fall Risks   No Fall Risks  Follow up Falls evaluation completed;Falls prevention discussed Falls evaluation completed       FALL RISK PREVENTION PERTAINING TO THE HOME:  Any stairs in or around the home? No  If so, are there any without handrails? No  Home free of loose throw rugs in walkways, pet beds, electrical cords, etc? Yes  Adequate lighting in your home to reduce risk of falls? Yes   ASSISTIVE DEVICES UTILIZED TO PREVENT  FALLS:  Use of a cane, walker or w/c? No  Grab bars in the bathroom? Yes  Shower chair or bench in shower? Yes  Elevated toilet seat or a handicapped toilet? No    Cognitive Function:        10/06/2022    9:26 AM 10/21/2020    3:27 PM  6CIT Screen  What Year? 0 points 0 points  What month? 0 points 0 points  What time? 0 points 0 points  Count back from 20 0 points 0 points  Months in reverse 0 points 0 points  Repeat phrase 0 points 2 points  Total Score 0 points 2 points    Immunizations Immunization History  Administered Date(s) Administered   Fluad Quad(high Dose 65+) 09/22/2020, 09/08/2021   Influenza, High Dose Seasonal PF 08/07/2019   Influenza-Unspecified 07/21/2017, 08/01/2018, 08/24/2022   PFIZER(Purple Top)SARS-COV-2 Vaccination 12/01/2019, 12/22/2019, 08/16/2020, 08/24/2021   Pneumococcal Conjugate-13 04/17/2014   Pneumococcal Polysaccharide-23 11/15/2010, 07/16/2015   Tdap 06/08/2012    TDAP status: Due, Education has been provided regarding the importance of this vaccine. Advised may receive this vaccine at local pharmacy or Health Dept. Aware to provide a copy of the vaccination record if obtained from local pharmacy or Health Dept. Verbalized acceptance and understanding.  Flu Vaccine status: Up to date  Pneumococcal vaccine status: Up to date  Covid-19 vaccine status: Information provided on how to obtain vaccines.   Qualifies for Shingles Vaccine? Yes   Zostavax completed No   Shingrix Completed?: No.    Education has been provided regarding the importance of this vaccine. Patient has been advised to call insurance company to determine out of pocket expense if they have not yet received this vaccine. Advised may also receive vaccine at local pharmacy or Health Dept. Verbalized acceptance and understanding.  Screening Tests Health Maintenance  Topic Date Due   Medicare Annual Wellness (AWV)  10/21/2021   Zoster Vaccines- Shingrix (1 of 2)  12/31/2022 (Originally [birth year]/1984)   Pneumonia  Vaccine 38+ Years old  Completed   INFLUENZA VACCINE  Completed   HPV VACCINES  Aged Out   DEXA SCAN  Discontinued   COVID-19 Vaccine  Discontinued    Health Maintenance  Health Maintenance Due  Topic Date Due   Medicare Annual Wellness (AWV)  10/21/2021    Mammogram status: No longer required due to age.  Lung Cancer Screening: (Low Dose CT Chest recommended if Age 69-80 years, 30 pack-year currently smoking OR have quit w/in 15years.) does not qualify.   Lung Cancer Screening Referral: N/A  Additional Screening:  Vision Screening: Recommended annual ophthalmology exams for early detection of glaucoma and other disorders of the eye. Is the patient up to date with their annual eye exam?  Yes  Who is the provider or what is the name of the office in which the patient attends annual eye exams? Dr Blair Hailey  Dental Screening: Recommended annual dental exams for proper oral hygiene  Community Resource Referral / Chronic Care Management: CRR required this visit?  No   CCM required this visit?  No      Plan:    1- Patient plans to get RSV Vaccine, Shingrix Vaccine, and Tetanus Vaccine at the pharmacy  I have personally reviewed and noted the following in the patient's chart:   Medical and social history Use of alcohol, tobacco or illicit drugs  Current medications and supplements including opioid prescriptions. Patient is not currently taking opioid prescriptions. Functional ability and status Nutritional status Physical activity Advanced directives List of other physicians Hospitalizations, surgeries, and ER visits in previous 12 months Historical Vitals Screenings to include cognitive, depression, and falls Referrals and appointments  In addition, I have reviewed and discussed with patient certain preventive protocols, quality metrics, and best practice recommendations. A written personalized care plan for preventive  services as well as general preventive health recommendations were provided to patient.     Erie Noe, LPN   26/37/8588

## 2022-10-11 ENCOUNTER — Other Ambulatory Visit: Payer: Self-pay

## 2022-10-15 ENCOUNTER — Other Ambulatory Visit: Payer: Self-pay | Admitting: Physician Assistant

## 2022-10-15 DIAGNOSIS — F320 Major depressive disorder, single episode, mild: Secondary | ICD-10-CM

## 2022-10-17 DIAGNOSIS — J449 Chronic obstructive pulmonary disease, unspecified: Secondary | ICD-10-CM | POA: Diagnosis not present

## 2022-10-22 NOTE — Telephone Encounter (Signed)
Patient called to see if she should be taking pravachol 40 mg or Lipitor 40 mg.   Patient made aware that she should stop Pravachol and start lipitor 40 mg daily  Verbalized Understanding.

## 2022-11-01 DIAGNOSIS — J410 Simple chronic bronchitis: Secondary | ICD-10-CM | POA: Diagnosis not present

## 2022-11-01 DIAGNOSIS — J449 Chronic obstructive pulmonary disease, unspecified: Secondary | ICD-10-CM | POA: Diagnosis not present

## 2022-11-02 DIAGNOSIS — N329 Bladder disorder, unspecified: Secondary | ICD-10-CM | POA: Diagnosis not present

## 2022-11-17 DIAGNOSIS — J449 Chronic obstructive pulmonary disease, unspecified: Secondary | ICD-10-CM | POA: Diagnosis not present

## 2022-11-25 ENCOUNTER — Other Ambulatory Visit: Payer: Self-pay

## 2022-11-25 DIAGNOSIS — F418 Other specified anxiety disorders: Secondary | ICD-10-CM

## 2022-11-25 NOTE — Addendum Note (Signed)
Addended by: Oleta Mouse on: 11/25/2022 01:58 PM   Modules accepted: Orders

## 2022-11-26 MED ORDER — VENLAFAXINE HCL ER 75 MG PO CP24: 75.0000 mg | ORAL_CAPSULE | Freq: Every day | ORAL | 5 refills | Status: DC

## 2022-12-02 ENCOUNTER — Other Ambulatory Visit: Payer: Self-pay

## 2022-12-02 DIAGNOSIS — F418 Other specified anxiety disorders: Secondary | ICD-10-CM

## 2022-12-02 MED ORDER — VENLAFAXINE HCL ER 75 MG PO CP24: 75.0000 mg | ORAL_CAPSULE | Freq: Every day | ORAL | 5 refills | Status: DC

## 2023-01-11 ENCOUNTER — Other Ambulatory Visit: Payer: Self-pay

## 2023-01-11 MED ORDER — GEMTESA 75 MG PO TABS: 1.0000 | ORAL_TABLET | Freq: Every day | ORAL | 2 refills | Status: DC

## 2023-01-20 ENCOUNTER — Other Ambulatory Visit: Payer: Self-pay | Admitting: Physician Assistant

## 2023-01-20 DIAGNOSIS — F418 Other specified anxiety disorders: Secondary | ICD-10-CM

## 2023-02-22 ENCOUNTER — Other Ambulatory Visit: Payer: Self-pay | Admitting: Physician Assistant

## 2023-02-22 ENCOUNTER — Other Ambulatory Visit: Payer: Self-pay

## 2023-02-22 MED ORDER — LOSARTAN POTASSIUM-HCTZ 100-12.5 MG PO TABS: 1.0000 | ORAL_TABLET | Freq: Every day | ORAL | 0 refills | Status: DC

## 2023-02-24 ENCOUNTER — Other Ambulatory Visit: Payer: Self-pay

## 2023-02-24 DIAGNOSIS — K2100 Gastro-esophageal reflux disease with esophagitis, without bleeding: Secondary | ICD-10-CM

## 2023-02-24 MED ORDER — OMEPRAZOLE 40 MG PO CPDR
40.0000 mg | DELAYED_RELEASE_CAPSULE | Freq: Every day | ORAL | 1 refills | Status: DC
Start: 2023-02-24 — End: 2023-05-30

## 2023-02-28 ENCOUNTER — Telehealth: Payer: Self-pay

## 2023-02-28 NOTE — Telephone Encounter (Signed)
Pt called today to request a same day appointment for the following symptoms:stomach pain -unable to eat-everything turns to gas, ear ache, and congestion. Unfortunately our schedule is full and we have no openings. I offered to see about an appointment for tomorrow. The patient did not think she could wait. Pt was notified to go to Urgent Care.

## 2023-04-06 ENCOUNTER — Encounter: Payer: Self-pay | Admitting: Physician Assistant

## 2023-04-06 ENCOUNTER — Other Ambulatory Visit: Payer: Self-pay | Admitting: Physician Assistant

## 2023-04-06 ENCOUNTER — Ambulatory Visit (INDEPENDENT_AMBULATORY_CARE_PROVIDER_SITE_OTHER): Payer: Medicare Other | Admitting: Physician Assistant

## 2023-04-06 VITALS — BP 120/68 | HR 67 | Temp 97.3°F | Ht 60.0 in | Wt 113.2 lb

## 2023-04-06 DIAGNOSIS — E782 Mixed hyperlipidemia: Secondary | ICD-10-CM | POA: Diagnosis not present

## 2023-04-06 DIAGNOSIS — E559 Vitamin D deficiency, unspecified: Secondary | ICD-10-CM | POA: Diagnosis not present

## 2023-04-06 DIAGNOSIS — F418 Other specified anxiety disorders: Secondary | ICD-10-CM

## 2023-04-06 DIAGNOSIS — R0789 Other chest pain: Secondary | ICD-10-CM

## 2023-04-06 DIAGNOSIS — K580 Irritable bowel syndrome with diarrhea: Secondary | ICD-10-CM | POA: Insufficient documentation

## 2023-04-06 DIAGNOSIS — I10 Essential (primary) hypertension: Secondary | ICD-10-CM | POA: Diagnosis not present

## 2023-04-06 MED ORDER — AMOXICILLIN-POT CLAVULANATE 875-125 MG PO TABS: 1.0000 | ORAL_TABLET | Freq: Two times a day (BID) | ORAL | 0 refills | Status: DC

## 2023-04-06 MED ORDER — DICYCLOMINE HCL 10 MG PO CAPS
10.0000 mg | ORAL_CAPSULE | Freq: Three times a day (TID) | ORAL | 0 refills | Status: DC
Start: 2023-04-06 — End: 2023-05-03

## 2023-04-06 NOTE — Progress Notes (Signed)
Subjective:  Patient ID: Brenda Patrick, female    DOB: [birth year]  Age: 89 y.o. MRN: [id number]  Chief Complaint  Patient presents with   Medical Management of Chronic Issues      Pt with history of hypertension - She is currently on tenormin 50mg  qd and hyzaar 100/12.5mg   Pt states that she has been having trouble with her stomach with bloating and loose bowels with lots of gas but then also mentions about 2 months ago she had 'pressure under her heart' that would not let up for several hours and then she was 'prayed over' and her symptoms resolved.  She states she felt it was more of stomach symptoms versus chest pain.  She denies dyspnea (but has had recent cough and congestion)  - she states she has a history of 'skipped heartbeat' all her life but is not symptomatic of this Pt has never seen cardiologist (was not referred in past by Dr Marina Goodell)  Pt with history of hyperlipidemia - she is currently on pravachol 40mg  qd  -she is due for labwork States she does try to watch her diet  Pt with history of depression with anxiety - pt is currently taking effexor 37.5mg , buspar 10mg  and lorazepam as needed.  She states that she is currently doing well on these medications  Pt with history of GERD - stable on prilosec 40mg  qd but as mentioned above has had some atypical chest pain and now also with bloating and loose bowels  Pt with history of stress incontinence - doing well on Gemtesa  Pt states she was seen a month ago with uri - was treated with antibiotics and did improve however has noted in the past week she is coughing again and it is productive - has had mild sinus pressure as well Denies fever Current Outpatient Medications on File Prior to Visit  Medication Sig Dispense Refill   acetaminophen (TYLENOL) 650 MG CR tablet Take 650 mg by mouth 2 (two) times daily.     albuterol (PROVENTIL) (2.5 MG/3ML) 0.083% nebulizer solution Use 1 Vial In Nebulizer Every 6 Hours As Needed For  Wheezing or Shortness of Breath 90 mL 11   Ascorbic Acid (VITAMIN C) 1000 MG tablet Take 1,000 mg by mouth daily.     atenolol (TENORMIN) 50 MG tablet TAKE 1 TABLET(50 MG) BY MOUTH DAILY 90 tablet 1   atorvastatin (LIPITOR) 40 MG tablet Take 1 tablet (40 mg total) by mouth daily. 90 tablet 1   busPIRone (BUSPAR) 10 MG tablet TAKE 1 TABLET(10 MG) BY MOUTH THREE TIMES DAILY 90 tablet 1   fluticasone (FLONASE) 50 MCG/ACT nasal spray SHAKE LIQUID AND USE 1 SPRAY IN EACH NOSTRIL AT BEDTIME 16 g 6   GEMTESA 75 MG TABS Take 1 tablet (75 mg total) by mouth daily. 30 tablet 2   LORazepam (ATIVAN) 1 MG tablet TAKE 1 TABLET(1 MG) BY MOUTH TWICE DAILY AS NEEDED 60 tablet 1   losartan-hydrochlorothiazide (HYZAAR) 100-12.5 MG tablet Take 1 tablet by mouth daily. 90 tablet 0   Multiple Vitamins-Minerals (MULTIVITAMIN WOMEN 50+ PO) Take 1 tablet by mouth daily.     omeprazole (PRILOSEC) 40 MG capsule Take 1 capsule (40 mg total) by mouth daily. 90 capsule 1   venlafaxine XR (EFFEXOR XR) 75 MG 24 hr capsule Take 1 capsule (75 mg total) by mouth daily with breakfast. 30 capsule 5   zinc gluconate 50 MG tablet Take 50 mg by mouth daily.     No  current facility-administered medications on file prior to visit.   Past Medical History:  Diagnosis Date   Acute bronchitis due to other specified organisms 04/25/2020   COPD (chronic obstructive pulmonary disease) (HCC) 05/26/2020   COVID-19 10/30/2019   Gastroesophageal reflux disease with esophagitis 05/26/2020   Hypertension    Major depressive disorder, single episode, mild (HCC) 05/26/2020   Mixed hyperlipidemia 05/26/2020   Moderate protein-calorie malnutrition (HCC) 05/26/2020   OSA (obstructive sleep apnea) 05/26/2020   Past Surgical History:  Procedure Laterality Date   ABDOMINAL HYSTERECTOMY     CYSTOSCOPY  01/15/2022   with bladder lesion removal, bladder papilloma   DUODENAL DIVERTICULECTOMY  04/09/1993   OVARIAN CYST REMOVAL      History reviewed. No  pertinent family history. Social History   Socioeconomic History   Marital status: Widowed    Spouse name: Not on file   Number of children: 0   Years of education: 34   Highest education level: Not on file  Occupational History   Occupation: Homemaker  Tobacco Use   Smoking status: Never   Smokeless tobacco: Never  Vaping Use   Vaping Use: Never used  Substance and Sexual Activity   Alcohol use: Never   Drug use: Never   Sexual activity: Not Currently  Other Topics Concern   Not on file  Social History Narrative   [employer]  44 years   Social Determinants of Health   Financial Resource Strain: Low Risk  (11/24/2021)   Overall Financial Resource Strain (CARDIA)    Difficulty of Paying Living Expenses: Not hard at all  Food Insecurity: No Food Insecurity (11/24/2021)   Hunger Vital Sign    Worried About Running Out of Food in the Last Year: Never true    Ran Out of Food in the Last Year: Never true  Transportation Needs: No Transportation Needs (11/24/2021)   PRAPARE - Administrator, Civil Service (Medical): No    Lack of Transportation (Non-Medical): No  Physical Activity: Sufficiently Active (10/21/2020)   Exercise Vital Sign    Days of Exercise per Week: 5 days    Minutes of Exercise per Session: 30 min  Stress: No Stress Concern Present (11/24/2021)   [employer] of Occupational Health - Occupational Stress Questionnaire    Feeling of Stress : Not at all  Social Connections: Moderately Integrated (11/24/2021)   Social Connection and Isolation Panel [NHANES]    Frequency of Communication with Friends and Family: Three times a week    Frequency of Social Gatherings with Friends and Family: Once a week    Attends Religious Services: More than 4 times per year    Active Member of [employer] or Organizations: Yes    Attends Banker Meetings: 1 to 4 times per year    Marital Status: Widowed   CONSTITUTIONAL: Negative for chills, fatigue,  fever, unintentional weight gain and unintentional weight loss.  E/N/T: see HPI CARDIOVASCULAR: see HPI RESPIRATORY: see HPI GASTROINTESTINAL: see HPI MSK: Negative for arthralgias and myalgias.  INTEGUMENTARY: Negative for rash.  NEUROLOGICAL: Negative for dizziness and headaches.  PSYCHIATRIC: Negative for sleep disturbance and to question depression screen.  Negative for depression, negative for anhedonia.      Objective:  PHYSICAL EXAM:   VS: BP 120/68 (BP Location: Left Arm, Patient Position: Sitting, Cuff Size: Normal)   Pulse 67   Temp (!) 97.3 F (36.3 C) (Temporal)   Ht 5' (1.524 m)   Wt 113 lb 3.2 oz (  51.3 kg)   SpO2 94%   BMI 22.11 kg/m   GEN: Well nourished, well developed, in no acute distress  HEENT: normal external ears and nose - normal external auditory canals and TMS -  - Lips, Teeth and Gums - normal  Oropharynx -mild PND noted Cardiac: RRR; no murmurs, rubs, or gallops,no edema - no significant varicosities Respiratory:  normal respiratory rate and pattern with no distress - normal breath sounds with no rales, rhonchi, wheezes or rubs GI: normal bowel sounds, no masses or tenderness Skin: warm and dry, no rash  Psych: euthymic mood, appropriate affect and demeanor  EKG - sinus bradycardia noted otherwise normal    04/06/2023    9:02 AM 10/06/2022    9:25 AM 09/30/2022    8:28 AM 06/14/2022   10:53 AM 03/24/2022    9:50 AM  Depression screen PHQ 2/9  Decreased Interest 1 2 2  0 0  Down, Depressed, Hopeless 1 1 1 1  0  PHQ - 2 Score 2 3 3 1  0  Altered sleeping 0 0 0  0  Tired, decreased energy 1 0 0  0  Change in appetite 0  0  0  Feeling bad or failure about yourself  1 0 0  0  Trouble concentrating 0 0 0  0  Moving slowly or fidgety/restless 0 0 0  0  Suicidal thoughts 0 0 0  0  PHQ-9 Score 4 3 3   0  Difficult doing work/chores Somewhat difficult Not difficult at all Not difficult at all      Lab Results  Component Value Date   WBC 6.8  09/30/2022   HGB 12.6 09/30/2022   HCT 37.4 09/30/2022   PLT 260 09/30/2022   GLUCOSE 92 09/30/2022   CHOL 211 (H) 09/30/2022   TRIG 82 09/30/2022   HDL 57 09/30/2022   LDLCALC 139 (H) 09/30/2022   ALT 13 09/30/2022   AST 18 09/30/2022   NA 141 09/30/2022   K 4.8 09/30/2022   CL 101 09/30/2022   CREATININE 0.87 09/30/2022   BUN 22 09/30/2022   CO2 27 09/30/2022   TSH 3.390 09/30/2022      Assessment & Plan:   Problem List Items Addressed This Visit       Cardiovascular and Mediastinum   Essential hypertension - Primary   Relevant Orders   CBC with Differential/Platelet   Comprehensive metabolic panel   Lipid panel Continue current meds     Other   Mixed hyperlipidemia   Relevant Orders   Lipid panel Continue meds Watch diet   Vitamin D deficiency   Relevant Orders   VITAMIN D 25 Hydroxy (Vit-D Deficiency, Fractures)    Depression with anxiety   Relevant Medications   venlafaxine XR (EFFEXOR XR) 75 MG 24 hr capsule increased from 37.5mg   Continue other meds as directed   Other Relevant Orders   TSH   Other Visit Diagnoses     IBS Recommend increase water, fiber Rx for bentyl tid  GERD Continue omeprazole  URI Rx Amoxil 875mg  bid - can use mucinex  Atypical chest pain/abdominal pain EKG - normal except sinus bradycardia (on metoprolol) Pt notified if she has recurrent symptoms recommend to be seen by cardiology otherwise will try the bentyl and see if helpful - follow up in 4 weeks                 .  Meds ordered this encounter  Medications   dicyclomine (BENTYL) 10 MG  capsule    Sig: Take 1 capsule (10 mg total) by mouth 3 (three) times daily before meals.    Dispense:  90 capsule    Refill:  0    Order Specific Question:   Supervising Provider    Answer:   Corey Harold   amoxicillin-clavulanate (AUGMENTIN) 875-125 MG tablet    Sig: Take 1 tablet by mouth 2 (two) times daily.    Dispense:  20 tablet    Refill:  0    Order  Specific Question:   Supervising Provider    AnswerCorey Harold     Orders Placed This Encounter  Procedures   CBC with Differential/Platelet   Comprehensive metabolic panel   TSH   Lipid panel   VITAMIN D 25 Hydroxy (Vit-D Deficiency, Fractures)   EKG 12-Lead     Follow-up: Return in about 4 weeks (around 05/04/2023) for follow-up.  An After Visit Summary was printed and given to the patient.  Jettie Pagan [facility] [phone]

## 2023-04-07 LAB — CBC WITH DIFFERENTIAL/PLATELET
Basophils Absolute: 0 10*3/uL (ref 0.0–0.2)
Basos: 1 %
EOS (ABSOLUTE): 0.1 10*3/uL (ref 0.0–0.4)
Eos: 2 %
Hematocrit: 37.6 % (ref 34.0–46.6)
Hemoglobin: 12.2 g/dL (ref 11.1–15.9)
Immature Grans (Abs): 0 10*3/uL (ref 0.0–0.1)
Immature Granulocytes: 0 %
Lymphocytes Absolute: 3.2 10*3/uL — ABNORMAL HIGH (ref 0.7–3.1)
Lymphs: 43 %
MCH: 32.2 pg (ref 26.6–33.0)
MCHC: 32.4 g/dL (ref 31.5–35.7)
MCV: 99 fL — ABNORMAL HIGH (ref 79–97)
Monocytes Absolute: 0.5 10*3/uL (ref 0.1–0.9)
Monocytes: 6 %
Neutrophils Absolute: 3.6 10*3/uL (ref 1.4–7.0)
Neutrophils: 48 %
Platelets: 284 10*3/uL (ref 150–450)
RBC: 3.79 x10E6/uL (ref 3.77–5.28)
RDW: 11.9 % (ref 11.7–15.4)
WBC: 7.5 10*3/uL (ref 3.4–10.8)

## 2023-04-07 LAB — COMPREHENSIVE METABOLIC PANEL
ALT: 49 IU/L — ABNORMAL HIGH (ref 0–32)
AST: 38 IU/L (ref 0–40)
Albumin/Globulin Ratio: 1.9 (ref 1.2–2.2)
Albumin: 4.4 g/dL (ref 3.7–4.7)
Alkaline Phosphatase: 81 IU/L (ref 44–121)
BUN/Creatinine Ratio: 23 (ref 12–28)
BUN: 18 mg/dL (ref 8–27)
Bilirubin Total: 0.6 mg/dL (ref 0.0–1.2)
CO2: 27 mmol/L (ref 20–29)
Calcium: 9.8 mg/dL (ref 8.7–10.3)
Chloride: 101 mmol/L (ref 96–106)
Creatinine, Ser: 0.78 mg/dL (ref 0.57–1.00)
Globulin, Total: 2.3 g/dL (ref 1.5–4.5)
Glucose: 95 mg/dL (ref 70–99)
Potassium: 4.5 mmol/L (ref 3.5–5.2)
Sodium: 142 mmol/L (ref 134–144)
Total Protein: 6.7 g/dL (ref 6.0–8.5)
eGFR: 73 mL/min/{1.73_m2} (ref >59)

## 2023-04-07 LAB — LIPID PANEL
Chol/HDL Ratio: 2.7 ratio (ref 0.0–4.4)
Cholesterol, Total: 168 mg/dL (ref 100–199)
HDL: 63 mg/dL (ref >39)
LDL Chol Calc (NIH): 95 mg/dL (ref 0–99)
Triglycerides: 51 mg/dL (ref 0–149)
VLDL Cholesterol Cal: 10 mg/dL (ref 5–40)

## 2023-04-07 LAB — CARDIOVASCULAR RISK ASSESSMENT

## 2023-04-07 LAB — TSH: TSH: 3.36 u[IU]/mL (ref 0.450–4.500)

## 2023-04-14 ENCOUNTER — Other Ambulatory Visit: Payer: Self-pay | Admitting: Physician Assistant

## 2023-04-26 ENCOUNTER — Other Ambulatory Visit: Payer: Self-pay | Admitting: Physician Assistant

## 2023-04-26 DIAGNOSIS — E782 Mixed hyperlipidemia: Secondary | ICD-10-CM

## 2023-05-03 ENCOUNTER — Encounter: Payer: Self-pay | Admitting: Physician Assistant

## 2023-05-03 ENCOUNTER — Ambulatory Visit (INDEPENDENT_AMBULATORY_CARE_PROVIDER_SITE_OTHER): Payer: Medicare Other | Admitting: Physician Assistant

## 2023-05-03 DIAGNOSIS — K580 Irritable bowel syndrome with diarrhea: Secondary | ICD-10-CM | POA: Diagnosis not present

## 2023-05-03 MED ORDER — DICYCLOMINE HCL 10 MG PO CAPS: 10.0000 mg | ORAL_CAPSULE | Freq: Three times a day (TID) | ORAL | 3 refills | Status: DC

## 2023-05-03 NOTE — Progress Notes (Signed)
Subjective:  Patient ID: Brenda Patrick, female    DOB: June 10, 1932  Age: 89 y.o. MRN: [id number]  Chief Complaint  Patient presents with   IBS    HPI Pt in today for follow up of abdominal cramps/belching.  She states since starting the bentyl she has improved greatly and rarely has any symptoms at all.  Denies nausea, vomiting or change in bowels.  Denies any chest pain or shortness of breath She would like to continue medication She voices no other concerns or problems- had chronic visit 1 month ago with normal labwork     04/06/2023    9:02 AM 10/06/2022    9:25 AM 09/30/2022    8:28 AM 06/14/2022   10:53 AM 03/24/2022    9:50 AM  Depression screen PHQ 2/9  Decreased Interest 1 2 2  0 0  Down, Depressed, Hopeless 1 1 1 1  0  PHQ - 2 Score 2 3 3 1  0  Altered sleeping 0 0 0  0  Tired, decreased energy 1 0 0  0  Change in appetite 0  0  0  Feeling bad or failure about yourself  1 0 0  0  Trouble concentrating 0 0 0  0  Moving slowly or fidgety/restless 0 0 0  0  Suicidal thoughts 0 0 0  0  PHQ-9 Score 4 3 3   0  Difficult doing work/chores Somewhat difficult Not difficult at all Not difficult at all          11/24/2021   10:38 AM 03/24/2022    9:50 AM 06/14/2022   10:53 AM 10/06/2022    9:25 AM 04/06/2023    9:01 AM  Fall Risk  Falls in the past year? 0 0 0 0 0  Was there an injury with Fall? 0 0 0 0 0  Fall Risk Category Calculator 0 0 0 0 0  Fall Risk Category (Retired) Low Low Low Low   (RETIRED) Patient Fall Risk Level  Low fall risk Low fall risk Low fall risk   Patient at Risk for Falls Due to   No Fall Risks No Fall Risks No Fall Risks  Fall risk Follow up   Falls evaluation completed Falls evaluation completed;Falls prevention discussed Falls evaluation completed     ROS CONSTITUTIONAL: Negative for chills, fatigue, fever,  E/N/T: Negative for ear pain, nasal congestion and sore throat.  CARDIOVASCULAR: Negative for chest pain, dizziness, palpitations and  pedal edema.  RESPIRATORY: Negative for recent cough and dyspnea.  GASTROINTESTINAL: Negative for abdominal pain, acid reflux symptoms, constipation, diarrhea, nausea and vomiting.    Current Outpatient Medications:    acetaminophen (TYLENOL) 650 MG CR tablet, Take 650 mg by mouth 2 (two) times daily., Disp: , Rfl:    albuterol (PROVENTIL) (2.5 MG/3ML) 0.083% nebulizer solution, Use 1 Vial In Nebulizer Every 6 Hours As Needed For Wheezing or Shortness of Breath, Disp: 90 mL, Rfl: 11   Ascorbic Acid (VITAMIN C) 1000 MG tablet, Take 1,000 mg by mouth daily., Disp: , Rfl:    atenolol (TENORMIN) 50 MG tablet, TAKE 1 TABLET(50 MG) BY MOUTH DAILY, Disp: 90 tablet, Rfl: 1   atorvastatin (LIPITOR) 40 MG tablet, TAKE 1 TABLET(40 MG) BY MOUTH DAILY, Disp: 90 tablet, Rfl: 1   busPIRone (BUSPAR) 10 MG tablet, TAKE 1 TABLET(10 MG) BY MOUTH THREE TIMES DAILY, Disp: 90 tablet, Rfl: 1   fluticasone (FLONASE) 50 MCG/ACT nasal spray, SHAKE LIQUID AND USE 1 SPRAY IN EACH NOSTRIL AT BEDTIME,  Disp: 16 g, Rfl: 6   GEMTESA 75 MG TABS, TAKE 1 TABLET(75 MG) BY MOUTH DAILY, Disp: 30 tablet, Rfl: 2   LORazepam (ATIVAN) 1 MG tablet, TAKE 1 TABLET(1 MG) BY MOUTH TWICE DAILY AS NEEDED, Disp: 60 tablet, Rfl: 1   losartan-hydrochlorothiazide (HYZAAR) 100-12.5 MG tablet, Take 1 tablet by mouth daily., Disp: 90 tablet, Rfl: 0   Multiple Vitamins-Minerals (MULTIVITAMIN WOMEN 50+ PO), Take 1 tablet by mouth daily., Disp: , Rfl:    omeprazole (PRILOSEC) 40 MG capsule, Take 1 capsule (40 mg total) by mouth daily., Disp: 90 capsule, Rfl: 1   venlafaxine XR (EFFEXOR XR) 75 MG 24 hr capsule, Take 1 capsule (75 mg total) by mouth daily with breakfast., Disp: 30 capsule, Rfl: 5   zinc gluconate 50 MG tablet, Take 50 mg by mouth daily., Disp: , Rfl:    dicyclomine (BENTYL) 10 MG capsule, Take 1 capsule (10 mg total) by mouth 3 (three) times daily before meals., Disp: 90 capsule, Rfl: 3  Past Medical History:  Diagnosis Date   Acute  bronchitis due to other specified organisms 04/25/2020   COPD (chronic obstructive pulmonary disease) (HCC) 05/26/2020   COVID-19 10/30/2019   Gastroesophageal reflux disease with esophagitis 05/26/2020   Hypertension    Major depressive disorder, single episode, mild (HCC) 05/26/2020   Mixed hyperlipidemia 05/26/2020   Moderate protein-calorie malnutrition (HCC) 05/26/2020   OSA (obstructive sleep apnea) 05/26/2020   Objective:  PHYSICAL EXAM:   BP 122/60 (BP Location: Left Arm, Patient Position: Sitting, Cuff Size: Normal)   Pulse 74   Temp (!) 97 F (36.1 C) (Temporal)   Ht 5' (1.524 m)   Wt 112 lb 12.8 oz (51.2 kg)   SpO2 96%   BMI 22.03 kg/m    GEN: Well nourished, well developed, in no acute distress  Cardiac: RRR; no murmurs, rubs, or gallops,no edema -  Respiratory:  normal respiratory rate and pattern with no distress - normal breath sounds with no rales, rhonchi, wheezes or rubs GI: normal bowel sounds, no masses or tenderness   Assessment & Plan:     Irritable bowel syndrome with diarrhea -     Dicyclomine HCl; Take 1 capsule (10 mg total) by mouth 3 (three) times daily before meals.  Dispense: 90 capsule; Refill: 3 Follow up if symptoms return    Follow-up: Return in about 4 months (around 09/02/2023) for chronic fasting follow-up.  An After Visit Summary was printed and given to the patient.  Jettie Pagan [facility] [phone]

## 2023-05-09 ENCOUNTER — Telehealth: Payer: Self-pay

## 2023-05-09 DIAGNOSIS — K580 Irritable bowel syndrome with diarrhea: Secondary | ICD-10-CM

## 2023-05-12 ENCOUNTER — Other Ambulatory Visit: Payer: Self-pay

## 2023-05-12 DIAGNOSIS — K580 Irritable bowel syndrome with diarrhea: Secondary | ICD-10-CM

## 2023-05-12 DIAGNOSIS — E782 Mixed hyperlipidemia: Secondary | ICD-10-CM

## 2023-05-12 MED ORDER — ATORVASTATIN CALCIUM 40 MG PO TABS
ORAL_TABLET | ORAL | 1 refills | Status: DC
Start: 2023-05-12 — End: 2024-01-03

## 2023-05-12 MED ORDER — DICYCLOMINE HCL 10 MG PO CAPS
10.0000 mg | ORAL_CAPSULE | Freq: Three times a day (TID) | ORAL | 3 refills | Status: DC
Start: 2023-05-12 — End: 2024-04-02

## 2023-05-21 ENCOUNTER — Other Ambulatory Visit: Payer: Self-pay | Admitting: Physician Assistant

## 2023-05-21 DIAGNOSIS — F418 Other specified anxiety disorders: Secondary | ICD-10-CM

## 2023-05-23 ENCOUNTER — Other Ambulatory Visit: Payer: Self-pay | Admitting: Physician Assistant

## 2023-05-30 ENCOUNTER — Other Ambulatory Visit: Payer: Self-pay

## 2023-05-30 DIAGNOSIS — F418 Other specified anxiety disorders: Secondary | ICD-10-CM

## 2023-05-30 DIAGNOSIS — K2100 Gastro-esophageal reflux disease with esophagitis, without bleeding: Secondary | ICD-10-CM

## 2023-05-30 MED ORDER — ATENOLOL 50 MG PO TABS: ORAL_TABLET | ORAL | 1 refills | Status: DC

## 2023-05-30 MED ORDER — VENLAFAXINE HCL ER 75 MG PO CP24
75.0000 mg | ORAL_CAPSULE | Freq: Every day | ORAL | 1 refills | Status: DC
Start: 2023-05-30 — End: 2023-06-07

## 2023-05-30 MED ORDER — OMEPRAZOLE 40 MG PO CPDR: 40.0000 mg | DELAYED_RELEASE_CAPSULE | Freq: Every day | ORAL | 1 refills | Status: DC

## 2023-06-07 ENCOUNTER — Other Ambulatory Visit: Payer: Self-pay | Admitting: Physician Assistant

## 2023-06-07 DIAGNOSIS — F418 Other specified anxiety disorders: Secondary | ICD-10-CM

## 2023-07-04 ENCOUNTER — Other Ambulatory Visit: Payer: Self-pay

## 2023-07-04 DIAGNOSIS — F418 Other specified anxiety disorders: Secondary | ICD-10-CM

## 2023-07-04 MED ORDER — LORAZEPAM 1 MG PO TABS
ORAL_TABLET | ORAL | 0 refills | Status: DC
Start: 2023-07-04 — End: 2023-07-26

## 2023-07-26 ENCOUNTER — Other Ambulatory Visit: Payer: Self-pay

## 2023-07-26 DIAGNOSIS — F418 Other specified anxiety disorders: Secondary | ICD-10-CM

## 2023-07-26 MED ORDER — LORAZEPAM 1 MG PO TABS
ORAL_TABLET | ORAL | 0 refills | Status: DC
Start: 2023-07-26 — End: 2023-12-09

## 2023-08-19 ENCOUNTER — Other Ambulatory Visit: Payer: Self-pay | Admitting: Physician Assistant

## 2023-08-21 ENCOUNTER — Other Ambulatory Visit: Payer: Self-pay | Admitting: Physician Assistant

## 2023-08-21 DIAGNOSIS — K2100 Gastro-esophageal reflux disease with esophagitis, without bleeding: Secondary | ICD-10-CM

## 2023-09-08 ENCOUNTER — Telehealth: Payer: Self-pay

## 2023-09-08 ENCOUNTER — Ambulatory Visit (INDEPENDENT_AMBULATORY_CARE_PROVIDER_SITE_OTHER): Payer: Medicare Other | Admitting: Physician Assistant

## 2023-09-08 ENCOUNTER — Encounter: Payer: Self-pay | Admitting: Physician Assistant

## 2023-09-08 VITALS — BP 130/70 | HR 68 | Temp 97.5°F | Ht 60.0 in | Wt 116.8 lb

## 2023-09-08 DIAGNOSIS — K2100 Gastro-esophageal reflux disease with esophagitis, without bleeding: Secondary | ICD-10-CM

## 2023-09-08 DIAGNOSIS — E782 Mixed hyperlipidemia: Secondary | ICD-10-CM

## 2023-09-08 DIAGNOSIS — F320 Major depressive disorder, single episode, mild: Secondary | ICD-10-CM | POA: Diagnosis not present

## 2023-09-08 DIAGNOSIS — I10 Essential (primary) hypertension: Secondary | ICD-10-CM

## 2023-09-08 DIAGNOSIS — J410 Simple chronic bronchitis: Secondary | ICD-10-CM

## 2023-09-08 MED ORDER — VENLAFAXINE HCL ER 150 MG PO CP24
150.0000 mg | ORAL_CAPSULE | Freq: Every day | ORAL | 1 refills | Status: DC
Start: 2023-09-08 — End: 2023-09-25

## 2023-09-08 NOTE — Telephone Encounter (Signed)
[facility] rx and provider signature

## 2023-09-08 NOTE — Progress Notes (Signed)
Subjective:  Patient ID: Brenda Patrick, female    DOB: 1932/04/24  Age: 89 y.o. MRN: [id number]  Chief Complaint  Patient presents with   Medical Management of Chronic Issues      Pt with history of hypertension - She is currently on tenormin 50mg  qd and hyzaar 100/12.5mg   She denies chest pain/sob/edema/palpitations  Pt with history of hyperlipidemia - she is currently on lipitor 40mg  qd  -she is due for labwork States she does try to watch her diet  Pt with history of depression with anxiety - pt is currently taking effexor 75 mg, buspar 10mg  and lorazepam as needed.  She states that she has been having some breakthrough depressive symptoms and would like to increase med to help  Pt with history of GERD - stable on prilosec 40mg  qd  Pt with history of stress incontinence - doing well on Gemtesa  Pt with COPD - she uses a nebulizer prn and uses oxygen at night - states several years ago had low readings at night with oxygen levels and started using - has changed insurance and needs new certification for nebulizer and oxygen through [facility] [employer] Current Outpatient Medications on File Prior to Visit  Medication Sig Dispense Refill   acetaminophen (TYLENOL) 650 MG CR tablet Take 650 mg by mouth 2 (two) times daily.     albuterol (PROVENTIL) (2.5 MG/3ML) 0.083% nebulizer solution Use 1 Vial In Nebulizer Every 6 Hours As Needed For Wheezing or Shortness of Breath 90 mL 11   Ascorbic Acid (VITAMIN C) 1000 MG tablet Take 1,000 mg by mouth daily.     atenolol (TENORMIN) 50 MG tablet TAKE 1 TABLET(50 MG) BY MOUTH DAILY 90 tablet 1   atorvastatin (LIPITOR) 40 MG tablet TAKE 1 TABLET(40 MG) BY MOUTH DAILY 90 tablet 1   busPIRone (BUSPAR) 10 MG tablet TAKE 1 TABLET(10 MG) BY MOUTH THREE TIMES DAILY 90 tablet 1   dicyclomine (BENTYL) 10 MG capsule Take 1 capsule (10 mg total) by mouth 3 (three) times daily before meals. 90 capsule 3   fluticasone (FLONASE) 50 MCG/ACT nasal spray SHAKE  LIQUID AND USE 1 SPRAY IN EACH NOSTRIL AT BEDTIME 16 g 6   GEMTESA 75 MG TABS TAKE 1 TABLET(75 MG) BY MOUTH DAILY 30 tablet 2   LORazepam (ATIVAN) 1 MG tablet TAKE 1 TABLET(1 MG) BY MOUTH TWICE DAILY AS NEEDED 60 tablet 0   losartan-hydrochlorothiazide (HYZAAR) 100-12.5 MG tablet TAKE 1 TABLET BY MOUTH DAILY 90 tablet 0   Multiple Vitamins-Minerals (MULTIVITAMIN WOMEN 50+ PO) Take 1 tablet by mouth daily.     omeprazole (PRILOSEC) 40 MG capsule TAKE 1 CAPSULE(40 MG) BY MOUTH DAILY 90 capsule 1   zinc gluconate 50 MG tablet Take 50 mg by mouth daily.     No current facility-administered medications on file prior to visit.   Past Medical History:  Diagnosis Date   Acute bronchitis due to other specified organisms 04/25/2020   COPD (chronic obstructive pulmonary disease) (HCC) 05/26/2020   COVID-19 10/30/2019   Gastroesophageal reflux disease with esophagitis 05/26/2020   Hypertension    Major depressive disorder, single episode, mild (HCC) 05/26/2020   Mixed hyperlipidemia 05/26/2020   Moderate protein-calorie malnutrition (HCC) 05/26/2020   OSA (obstructive sleep apnea) 05/26/2020   Past Surgical History:  Procedure Laterality Date   ABDOMINAL HYSTERECTOMY     CYSTOSCOPY  01/15/2022   with bladder lesion removal, bladder papilloma   DUODENAL DIVERTICULECTOMY  04/09/1993   OVARIAN CYST REMOVAL  History reviewed. No pertinent family history. Social History   Socioeconomic History   Marital status: Widowed    Spouse name: Not on file   Number of children: 0   Years of education: 40   Highest education level: Not on file  Occupational History   Occupation: Homemaker  Tobacco Use   Smoking status: Never   Smokeless tobacco: Never  Vaping Use   Vaping status: Never Used  Substance and Sexual Activity   Alcohol use: Never   Drug use: Never   Sexual activity: Not Currently  Other Topics Concern   Not on file  Social History Narrative   [employer]  44 years   Social  Determinants of Health   Financial Resource Strain: Low Risk  (11/24/2021)   Overall Financial Resource Strain (CARDIA)    Difficulty of Paying Living Expenses: Not hard at all  Food Insecurity: No Food Insecurity (11/24/2021)   Hunger Vital Sign    Worried About Running Out of Food in the Last Year: Never true    Ran Out of Food in the Last Year: Never true  Transportation Needs: No Transportation Needs (11/24/2021)   PRAPARE - Administrator, Civil Service (Medical): No    Lack of Transportation (Non-Medical): No  Physical Activity: Sufficiently Active (10/21/2020)   Exercise Vital Sign    Days of Exercise per Week: 5 days    Minutes of Exercise per Session: 30 min  Stress: No Stress Concern Present (11/24/2021)   [employer] of Occupational Health - Occupational Stress Questionnaire    Feeling of Stress : Not at all  Social Connections: Moderately Integrated (11/24/2021)   Social Connection and Isolation Panel [NHANES]    Frequency of Communication with Friends and Family: Three times a week    Frequency of Social Gatherings with Friends and Family: Once a week    Attends Religious Services: More than 4 times per year    Active Member of [employer] or Organizations: Yes    Attends Banker Meetings: 1 to 4 times per year    Marital Status: Widowed   CONSTITUTIONAL: Negative for chills, fatigue, fever, unintentional weight gain and unintentional weight loss.  E/N/T: Negative for ear pain, nasal congestion and sore throat.  CARDIOVASCULAR: Negative for chest pain, dizziness, palpitations and pedal edema.  RESPIRATORY: Negative for recent cough and dyspnea.  GASTROINTESTINAL: Negative for abdominal pain, acid reflux symptoms, constipation, diarrhea, nausea and vomiting.  MSK: Negative for arthralgias and myalgias.  INTEGUMENTARY: Negative for rash.  NEUROLOGICAL: Negative for dizziness and headaches.  PSYCHIATRIC:see HPI  Objective:   PHYSICAL EXAM:   VS:  BP 130/70 (BP Location: Left Arm, Patient Position: Sitting, Cuff Size: Normal)   Pulse 68   Temp (!) 97.5 F (36.4 C) (Temporal)   Ht 5' (1.524 m)   Wt 116 lb 12.8 oz (53 kg)   SpO2 98%   BMI 22.81 kg/m   GEN: Well nourished, well developed, in no acute distress  Cardiac: RRR; no murmurs, rubs, or gallops,no edema -  Respiratory:  normal respiratory rate and pattern with no distress - normal breath sounds with no rales, rhonchi, wheezes or rubs  MS: no deformity or atrophy  Skin: warm and dry, no rash  Neuro:  Alert and Oriented x 3,  - CN II-Xii grossly intact Psych: euthymic mood, appropriate affect and demeanor     09/08/2023   10:33 AM 04/06/2023    9:02 AM 10/06/2022    9:25 AM 09/30/2022  8:28 AM 06/14/2022   10:53 AM  Depression screen PHQ 2/9  Decreased Interest 3 1 2 2  0  Down, Depressed, Hopeless 3 1 1 1 1   PHQ - 2 Score 6 2 3 3 1   Altered sleeping 0 0 0 0   Tired, decreased energy 2 1 0 0   Change in appetite 2 0  0   Feeling bad or failure about yourself  2 1 0 0   Trouble concentrating 0 0 0 0   Moving slowly or fidgety/restless 0 0 0 0   Suicidal thoughts 0 0 0 0   PHQ-9 Score 12 4 3 3    Difficult doing work/chores Somewhat difficult Somewhat difficult Not difficult at all Not difficult at all     Lab Results  Component Value Date   WBC 7.5 04/06/2023   HGB 12.2 04/06/2023   HCT 37.6 04/06/2023   PLT 284 04/06/2023   GLUCOSE 95 04/06/2023   CHOL 168 04/06/2023   TRIG 51 04/06/2023   HDL 63 04/06/2023   LDLCALC 95 04/06/2023   ALT 49 (H) 04/06/2023   AST 38 04/06/2023   NA 142 04/06/2023   K 4.5 04/06/2023   CL 101 04/06/2023   CREATININE 0.78 04/06/2023   BUN 18 04/06/2023   CO2 27 04/06/2023   TSH 3.360 04/06/2023      Assessment & Plan:   Problem List Items Addressed This Visit       Cardiovascular and Mediastinum   Essential hypertension - Primary   Relevant Orders   CBC with Differential/Platelet   Comprehensive metabolic  panel   Lipid panel Continue current meds     Other   Mixed hyperlipidemia   Relevant Orders   Lipid panel Continue meds Watch diet   Vitamin D deficiency   Relevant Orders   VITAMIN D 25 Hydroxy (Vit-D Deficiency, Fractures)    Depression with anxiety   Relevant Medications   venlafaxine XR (EFFEXOR XR) increase to 150mg  qd Continue other meds as directed   Other Relevant Orders   TSH  COPD Continue nebulizer as needed and oxygen at night Other Visit Diagnoses     Other fatigue       Relevant Orders   CBC with Differential/Platelet   Comprehensive metabolic panel   TSH  GERD Continue omeprazole     .  Meds ordered this encounter  Medications   venlafaxine XR (EFFEXOR-XR) 150 MG 24 hr capsule    Sig: Take 1 capsule (150 mg total) by mouth daily with breakfast.    Dispense:  90 capsule    Refill:  1    Order Specific Question:   Supervising Provider    AnswerCorey Harold     Orders Placed This Encounter  Procedures   CBC with Differential/Platelet   Comprehensive metabolic panel   Lipid panel     Follow-up: Return in about 3 months (around 12/09/2023) for follow-up.  An After Visit Summary was printed and given to the patient.  Jettie Pagan [facility] [phone]

## 2023-09-09 LAB — CBC WITH DIFFERENTIAL/PLATELET
Basophils Absolute: 0.1 10*3/uL (ref 0.0–0.2)
Basos: 1 %
EOS (ABSOLUTE): 0.2 10*3/uL (ref 0.0–0.4)
Eos: 2 %
Hematocrit: 37.6 % (ref 34.0–46.6)
Hemoglobin: 12.5 g/dL (ref 11.1–15.9)
Immature Grans (Abs): 0 10*3/uL (ref 0.0–0.1)
Immature Granulocytes: 0 %
Lymphocytes Absolute: 3 10*3/uL (ref 0.7–3.1)
Lymphs: 34 %
MCH: 33.3 pg — ABNORMAL HIGH (ref 26.6–33.0)
MCHC: 33.2 g/dL (ref 31.5–35.7)
MCV: 100 fL — ABNORMAL HIGH (ref 79–97)
Monocytes Absolute: 0.5 10*3/uL (ref 0.1–0.9)
Monocytes: 6 %
Neutrophils Absolute: 5 10*3/uL (ref 1.4–7.0)
Neutrophils: 57 %
Platelets: 300 10*3/uL (ref 150–450)
RBC: 3.75 x10E6/uL — ABNORMAL LOW (ref 3.77–5.28)
RDW: 11.6 % — ABNORMAL LOW (ref 11.7–15.4)
WBC: 8.7 10*3/uL (ref 3.4–10.8)

## 2023-09-09 LAB — COMPREHENSIVE METABOLIC PANEL
ALT: 20 [IU]/L (ref 0–32)
AST: 26 [IU]/L (ref 0–40)
Albumin: 4.3 g/dL (ref 3.7–4.7)
Alkaline Phosphatase: 91 [IU]/L (ref 44–121)
BUN/Creatinine Ratio: 28 (ref 12–28)
BUN: 21 mg/dL (ref 8–27)
Bilirubin Total: 0.5 mg/dL (ref 0.0–1.2)
CO2: 28 mmol/L (ref 20–29)
Calcium: 10.1 mg/dL (ref 8.7–10.3)
Chloride: 102 mmol/L (ref 96–106)
Creatinine, Ser: 0.76 mg/dL (ref 0.57–1.00)
Globulin, Total: 2.4 g/dL (ref 1.5–4.5)
Glucose: 97 mg/dL (ref 70–99)
Potassium: 4.5 mmol/L (ref 3.5–5.2)
Sodium: 144 mmol/L (ref 134–144)
Total Protein: 6.7 g/dL (ref 6.0–8.5)
eGFR: 75 mL/min/{1.73_m2} (ref >59)

## 2023-09-09 LAB — LIPID PANEL
Chol/HDL Ratio: 3.1 ratio (ref 0.0–4.4)
Cholesterol, Total: 183 mg/dL (ref 100–199)
HDL: 59 mg/dL (ref >39)
LDL Chol Calc (NIH): 110 mg/dL — ABNORMAL HIGH (ref 0–99)
Triglycerides: 74 mg/dL (ref 0–149)
VLDL Cholesterol Cal: 14 mg/dL (ref 5–40)

## 2023-09-21 ENCOUNTER — Ambulatory Visit: Payer: Medicare Other

## 2023-09-21 DIAGNOSIS — Z0000 Encounter for general adult medical examination without abnormal findings: Secondary | ICD-10-CM | POA: Diagnosis not present

## 2023-09-21 NOTE — Progress Notes (Signed)
Subjective:   Brenda Patrick is a 89 y.o. female who presents for Medicare Annual (Subsequent) preventive examination.  Visit Complete: Virtual I connected with  Brenda Patrick on 09/21/23 by a audio enabled telemedicine application and verified that I am speaking with the correct person using two identifiers.  Patient Location: Home  Provider Location: Home Office  I discussed the limitations of evaluation and management by telemedicine. The patient expressed understanding and agreed to proceed.  Vital Signs: Because this visit was a virtual/telehealth visit, some criteria may be missing or patient reported. Any vitals not documented were not able to be obtained and vitals that have been documented are patient reported.       Objective:    There were no vitals filed for this visit. There is no height or weight on file to calculate BMI.     11/24/2021   10:40 AM 10/21/2020    3:23 PM 10/30/2019   10:51 AM  Advanced Directives  Does Patient Have a Medical Advance Directive? Yes Yes Yes  Type of Estate agent of [place];Living will Living will Healthcare Power of Attorney  Does patient want to make changes to medical advance directive? No - Patient declined  No - Patient declined  Copy of Healthcare Power of Attorney in Chart? No - copy requested  No - copy requested  Would patient like information on creating a medical advance directive?   No - Patient declined    Current Medications (verified) Outpatient Encounter Medications as of 09/21/2023  Medication Sig   acetaminophen (TYLENOL) 650 MG CR tablet Take 650 mg by mouth 2 (two) times daily.   albuterol (PROVENTIL) (2.5 MG/3ML) 0.083% nebulizer solution Use 1 Vial In Nebulizer Every 6 Hours As Needed For Wheezing or Shortness of Breath   Ascorbic Acid (VITAMIN C) 1000 MG tablet Take 1,000 mg by mouth daily.   atenolol (TENORMIN) 50 MG tablet TAKE 1 TABLET(50 MG) BY MOUTH DAILY   atorvastatin  (LIPITOR) 40 MG tablet TAKE 1 TABLET(40 MG) BY MOUTH DAILY   busPIRone (BUSPAR) 10 MG tablet TAKE 1 TABLET(10 MG) BY MOUTH THREE TIMES DAILY   dicyclomine (BENTYL) 10 MG capsule Take 1 capsule (10 mg total) by mouth 3 (three) times daily before meals.   fluticasone (FLONASE) 50 MCG/ACT nasal spray SHAKE LIQUID AND USE 1 SPRAY IN EACH NOSTRIL AT BEDTIME   GEMTESA 75 MG TABS TAKE 1 TABLET(75 MG) BY MOUTH DAILY   LORazepam (ATIVAN) 1 MG tablet TAKE 1 TABLET(1 MG) BY MOUTH TWICE DAILY AS NEEDED   losartan-hydrochlorothiazide (HYZAAR) 100-12.5 MG tablet TAKE 1 TABLET BY MOUTH DAILY   Multiple Vitamins-Minerals (MULTIVITAMIN WOMEN 50+ PO) Take 1 tablet by mouth daily.   omeprazole (PRILOSEC) 40 MG capsule TAKE 1 CAPSULE(40 MG) BY MOUTH DAILY   venlafaxine XR (EFFEXOR-XR) 150 MG 24 hr capsule Take 1 capsule (150 mg total) by mouth daily with breakfast.   zinc gluconate 50 MG tablet Take 50 mg by mouth daily.   No facility-administered encounter medications on file as of 09/21/2023.    Allergies (verified) Ceftin [cefuroxime], Cefuroxime axetil, and Lisinopril   History: Past Medical History:  Diagnosis Date   Acute bronchitis due to other specified organisms 04/25/2020   COPD (chronic obstructive pulmonary disease) (HCC) 05/26/2020   COVID-19 10/30/2019   Gastroesophageal reflux disease with esophagitis 05/26/2020   Hypertension    Major depressive disorder, single episode, mild (HCC) 05/26/2020   Mixed hyperlipidemia 05/26/2020   Moderate protein-calorie malnutrition (HCC)  05/26/2020   OSA (obstructive sleep apnea) 05/26/2020   Past Surgical History:  Procedure Laterality Date   ABDOMINAL HYSTERECTOMY     CYSTOSCOPY  01/15/2022   with bladder lesion removal, bladder papilloma   DUODENAL DIVERTICULECTOMY  04/09/1993   OVARIAN CYST REMOVAL     History reviewed. No pertinent family history. Social History   Socioeconomic History   Marital status: Widowed    Spouse name: Not on file    Number of children: 0   Years of education: 50   Highest education level: Not on file  Occupational History   Occupation: Homemaker  Tobacco Use   Smoking status: Never   Smokeless tobacco: Never  Vaping Use   Vaping status: Never Used  Substance and Sexual Activity   Alcohol use: Never   Drug use: Never   Sexual activity: Not Currently  Other Topics Concern   Not on file  Social History Narrative   [employer]  46 years   Social Determinants of Health   Financial Resource Strain: Low Risk  (09/21/2023)   Overall Financial Resource Strain (CARDIA)    Difficulty of Paying Living Expenses: Not hard at all  Food Insecurity: No Food Insecurity (09/21/2023)   Hunger Vital Sign    Worried About Running Out of Food in the Last Year: Never true    Ran Out of Food in the Last Year: Never true  Transportation Needs: No Transportation Needs (09/21/2023)   PRAPARE - Administrator, Civil Service (Medical): No    Lack of Transportation (Non-Medical): No  Physical Activity: Inactive (09/21/2023)   Exercise Vital Sign    Days of Exercise per Week: 0 days    Minutes of Exercise per Session: 0 min  Stress: No Stress Concern Present (09/21/2023)   [employer] of Occupational Health - Occupational Stress Questionnaire    Feeling of Stress : Not at all  Social Connections: Moderately Integrated (09/21/2023)   Social Connection and Isolation Panel [NHANES]    Frequency of Communication with Friends and Family: More than three times a week    Frequency of Social Gatherings with Friends and Family: More than three times a week    Attends Religious Services: More than 4 times per year    Active Member of [employer] or Organizations: Yes    Attends Banker Meetings: More than 4 times per year    Marital Status: Widowed    Tobacco Counseling Counseling given: Not Answered   Clinical Intake:  Pre-visit preparation completed: Yes  Pain : No/denies pain      Diabetes: No  How often do you need to have someone help you when you read instructions, pamphlets, or other written materials from your doctor or pharmacy?: 1 - Never  Interpreter Needed?: No  Information entered by :: Remi Haggard LPN   Activities of Daily Living    09/21/2023   11:24 AM 10/06/2022    9:26 AM  In your present state of health, do you have any difficulty performing the following activities:  Hearing? 0 0  Vision? 0 0  Difficulty concentrating or making decisions? 0 0  Walking or climbing stairs? 0 0  Dressing or bathing? 0 0  Doing errands, shopping? 0 0  Preparing Food and eating ? N N  Using the Toilet? N N  In the past six months, have you accidently leaked urine? N Y  Do you have problems with loss of bowel control? Y N  Managing your Medications?  N  Managing your Finances? N N  Housekeeping or managing your Housekeeping? N N    Patient Care Team: Marianne Sofia, Cordelia Poche as PCP - General (Physician Assistant)  Indicate any recent Medical Services you may have received from other than [facility] providers in the past year (date may be approximate).     Assessment:   This is a routine wellness examination for Mileydi.  Hearing/Vision screen Hearing Screening - Comments:: No trouble hearing Vision Screening - Comments:: Dr. Ladon Applebaum Up to date   Goals Addressed             This Visit's Progress    Patient Stated       Continue current lifestyle        Depression Screen    09/21/2023   11:29 AM 09/08/2023   10:33 AM 04/06/2023    9:02 AM 10/06/2022    9:25 AM 09/30/2022    8:28 AM 06/14/2022   10:53 AM 03/24/2022    9:50 AM  PHQ 2/9 Scores  PHQ - 2 Score 2 6 2 3 3 1  0  PHQ- 9 Score 5 12 4 3 3   0    Fall Risk    09/21/2023   11:24 AM 09/08/2023   10:32 AM 04/06/2023    9:01 AM 10/06/2022    9:25 AM 06/14/2022   10:53 AM  Fall Risk   Falls in the past year? 0 0 0 0 0  Number falls in past yr: 0 0 0 0 0  Injury with Fall? 0 0 0 0 0  Risk  for fall due to :  No Fall Risks No Fall Risks No Fall Risks No Fall Risks  Follow up Falls evaluation completed;Education provided;Falls prevention discussed Falls evaluation completed Falls evaluation completed Falls evaluation completed;Falls prevention discussed Falls evaluation completed    MEDICARE RISK AT HOME: Medicare Risk at Home Any stairs in or around the home?: No If so, are there any without handrails?: No Home free of loose throw rugs in walkways, pet beds, electrical cords, etc?: Yes Adequate lighting in your home to reduce risk of falls?: Yes Use of a cane, walker or w/c?: No Grab bars in the bathroom?: Yes Shower chair or bench in shower?: No Elevated toilet seat or a handicapped toilet?: No  TIMED UP AND GO:  Was the test performed?  No    Cognitive Function:        09/21/2023   11:27 AM 10/06/2022    9:26 AM 10/21/2020    3:27 PM  6CIT Screen  What Year? 0 points 0 points 0 points  What month? 0 points 0 points 0 points  What time? 0 points 0 points 0 points  Count back from 20 0 points 0 points 0 points  Months in reverse 0 points 0 points 0 points  Repeat phrase 0 points 0 points 2 points  Total Score 0 points 0 points 2 points    Immunizations Immunization History  Administered Date(s) Administered   Fluad Quad(high Dose 65+) 09/22/2020, 09/08/2021   Influenza, High Dose Seasonal PF 08/07/2019, 08/24/2023   Influenza-Unspecified 07/21/2017, 08/01/2018, 08/24/2022   PFIZER(Purple Top)SARS-COV-2 Vaccination 12/01/2019, 12/22/2019, 08/16/2020, 08/24/2021   Pneumococcal Conjugate-13 04/17/2014   Pneumococcal Polysaccharide-23 11/15/2010, 07/16/2015   Tdap 06/08/2012   Zoster Recombinant(Shingrix) 08/24/2023    TDAP status: Due, Education has been provided regarding the importance of this vaccine. Advised may receive this vaccine at local pharmacy or Health Dept. Aware to provide a copy of the vaccination  record if obtained from local pharmacy or  Health Dept. Verbalized acceptance and understanding.  Flu Vaccine status: Up to date  Pneumococcal vaccine status: Up to date  Covid-19 vaccine status: Information provided on how to obtain vaccines.   Qualifies for Shingles Vaccine? Yes   Zostavax completed Yes   Shingrix Completed?: No.    Education has been provided regarding the importance of this vaccine. Patient has been advised to call insurance company to determine out of pocket expense if they have not yet received this vaccine. Advised may also receive vaccine at local pharmacy or Health Dept. Verbalized acceptance and understanding.  Screening Tests Health Maintenance  Topic Date Due   DTaP/Tdap/Td (2 - Td or Tdap) 09/07/2024 (Originally 06/08/2022)   Zoster Vaccines- Shingrix (2 of 2) 10/19/2023   Medicare Annual Wellness (AWV)  09/20/2024   Pneumonia Vaccine 55+ Years old  Completed   INFLUENZA VACCINE  Completed   HPV VACCINES  Aged Out   DEXA SCAN  Discontinued   COVID-19 Vaccine  Discontinued    Health Maintenance  There are no preventive care reminders to display for this patient.   Colorectal cancer screening: No longer required.   Mammogram status: No longer required due to age.  Bone Density Education provided  Lung Cancer Screening: (Low Dose CT Chest recommended if Age 28-80 years, 20 pack-year currently smoking OR have quit w/in 15years.) does not qualify.   Lung Cancer Screening Referral:   Additional Screening:  Hepatitis C Screening: does not qualify;   Vision Screening: Recommended annual ophthalmology exams for early detection of glaucoma and other disorders of the eye. Is the patient up to date with their annual eye exam?  Yes  Who is the provider or what is the name of the office in which the patient attends annual eye exams? Ladon Applebaum If pt is not established with a provider, would they like to be referred to a provider to establish care? No .   Dental Screening: Recommended annual dental  exams for proper oral hygiene    Community Resource Referral / Chronic Care Management: CRR required this visit?  No   CCM required this visit?  No     Plan:     I have personally reviewed and noted the following in the patient's chart:   Medical and social history Use of alcohol, tobacco or illicit drugs  Current medications and supplements including opioid prescriptions. Patient is not currently taking opioid prescriptions. Functional ability and status Nutritional status Physical activity Advanced directives List of other physicians Hospitalizations, surgeries, and ER visits in previous 12 months Vitals Screenings to include cognitive, depression, and falls Referrals and appointments  In addition, I have reviewed and discussed with patient certain preventive protocols, quality metrics, and best practice recommendations. A written personalized care plan for preventive services as well as general preventive health recommendations were provided to patient.     Remi Haggard, LPN   [id number]   After Visit Summary: (MyChart) Due to this being a telephonic visit, the after visit summary with patients personalized plan was offered to patient via MyChart   Nurse Notes:

## 2023-09-21 NOTE — Patient Instructions (Signed)
Brenda Patrick , Thank you for taking time to come for your Medicare Wellness Visit. I appreciate your ongoing commitment to your health goals. Please review the following plan we discussed and let me know if I can assist you in the future.   Screening recommendations/referrals: Colonoscopy: no longer required Mammogram: no longer required Bone Density: declined Recommended yearly ophthalmology/optometry visit for glaucoma screening and checkup Recommended yearly dental visit for hygiene and checkup  Vaccinations: Influenza vaccine:  Pneumococcal vaccine: up to date Tdap vaccine:  Shingles vaccine:     Advanced directives:   Conditions/risks identified:   Next appointment:    Preventive Care 65 Years and Older, Female Preventive care refers to lifestyle choices and visits with your health care provider that can promote health and wellness. What does preventive care include? A yearly physical exam. This is also called an annual well check. Dental exams once or twice a year. Routine eye exams. Ask your health care provider how often you should have your eyes checked. Personal lifestyle choices, including: Daily care of your teeth and gums. Regular physical activity. Eating a healthy diet. Avoiding tobacco and drug use. Limiting alcohol use. Practicing safe sex. Taking low-dose aspirin every day. Taking vitamin and mineral supplements as recommended by your health care provider. What happens during an annual well check? The services and screenings done by your health care provider during your annual well check will depend on your age, overall health, lifestyle risk factors, and family history of disease. Counseling  Your health care provider may ask you questions about your: Alcohol use. Tobacco use. Drug use. Emotional well-being. Home and relationship well-being. Sexual activity. Eating habits. History of falls. Memory and ability to understand (cognition). Work and  work Astronomer. Reproductive health. Screening  You may have the following tests or measurements: Height, weight, and BMI. Blood pressure. Lipid and cholesterol levels. These may be checked every 5 years, or more frequently if you are over 62 years old. Skin check. Lung cancer screening. You may have this screening every year starting at age 46 if you have a 30-pack-year history of smoking and currently smoke or have quit within the past 15 years. Fecal occult blood test (FOBT) of the stool. You may have this test every year starting at age 36. Flexible sigmoidoscopy or colonoscopy. You may have a sigmoidoscopy every 5 years or a colonoscopy every 10 years starting at age 32. Hepatitis C blood test. Hepatitis B blood test. Sexually transmitted disease (STD) testing. Diabetes screening. This is done by checking your blood sugar (glucose) after you have not eaten for a while (fasting). You may have this done every 1-3 years. Bone density scan. This is done to screen for osteoporosis. You may have this done starting at age 7. Mammogram. This may be done every 1-2 years. Talk to your health care provider about how often you should have regular mammograms. Talk with your health care provider about your test results, treatment options, and if necessary, the need for more tests. Vaccines  Your health care provider may recommend certain vaccines, such as: Influenza vaccine. This is recommended every year. Tetanus, diphtheria, and acellular pertussis (Tdap, Td) vaccine. You may need a Td booster every 10 years. Zoster vaccine. You may need this after age 67. Pneumococcal 13-valent conjugate (PCV13) vaccine. One dose is recommended after age 50. Pneumococcal polysaccharide (PPSV23) vaccine. One dose is recommended after age 33. Talk to your health care provider about which screenings and vaccines you need and how often you  need them. This information is not intended to replace advice given to you  by your health care provider. Make sure you discuss any questions you have with your health care provider. Document Released: 11/28/2015 Document Revised: 07/21/2016 Document Reviewed: 09/02/2015 Elsevier Interactive Patient Education  2017 [employer].  Fall Prevention in the Home Falls can cause injuries. They can happen to people of all ages. There are many things you can do to make your home safe and to help prevent falls. What can I do on the outside of my home? Regularly fix the edges of walkways and driveways and fix any cracks. Remove anything that might make you trip as you walk through a door, such as a raised step or threshold. Trim any bushes or trees on the path to your home. Use bright outdoor lighting. Clear any walking paths of anything that might make someone trip, such as rocks or tools. Regularly check to see if handrails are loose or broken. Make sure that both sides of any steps have handrails. Any raised decks and porches should have guardrails on the edges. Have any leaves, snow, or ice cleared regularly. Use sand or salt on walking paths during winter. Clean up any spills in your garage right away. This includes oil or grease spills. What can I do in the bathroom? Use night lights. Install grab bars by the toilet and in the tub and shower. Do not use towel bars as grab bars. Use non-skid mats or decals in the tub or shower. If you need to sit down in the shower, use a plastic, non-slip stool. Keep the floor dry. Clean up any water that spills on the floor as soon as it happens. Remove soap buildup in the tub or shower regularly. Attach bath mats securely with double-sided non-slip rug tape. Do not have throw rugs and other things on the floor that can make you trip. What can I do in the bedroom? Use night lights. Make sure that you have a light by your bed that is easy to reach. Do not use any sheets or blankets that are too big for your bed. They should not  hang down onto the floor. Have a firm chair that has side arms. You can use this for support while you get dressed. Do not have throw rugs and other things on the floor that can make you trip. What can I do in the kitchen? Clean up any spills right away. Avoid walking on wet floors. Keep items that you use a lot in easy-to-reach places. If you need to reach something above you, use a strong step stool that has a grab bar. Keep electrical cords out of the way. Do not use floor polish or wax that makes floors slippery. If you must use wax, use non-skid floor wax. Do not have throw rugs and other things on the floor that can make you trip. What can I do with my stairs? Do not leave any items on the stairs. Make sure that there are handrails on both sides of the stairs and use them. Fix handrails that are broken or loose. Make sure that handrails are as long as the stairways. Check any carpeting to make sure that it is firmly attached to the stairs. Fix any carpet that is loose or worn. Avoid having throw rugs at the top or bottom of the stairs. If you do have throw rugs, attach them to the floor with carpet tape. Make sure that you have a light switch  at the top of the stairs and the bottom of the stairs. If you do not have them, ask someone to add them for you. What else can I do to help prevent falls? Wear shoes that: Do not have high heels. Have rubber bottoms. Are comfortable and fit you well. Are closed at the toe. Do not wear sandals. If you use a stepladder: Make sure that it is fully opened. Do not climb a closed stepladder. Make sure that both sides of the stepladder are locked into place. Ask someone to hold it for you, if possible. Clearly mark and make sure that you can see: Any grab bars or handrails. First and last steps. Where the edge of each step is. Use tools that help you move around (mobility aids) if they are needed. These  include: Canes. Walkers. Scooters. Crutches. Turn on the lights when you go into a dark area. Replace any light bulbs as soon as they burn out. Set up your furniture so you have a clear path. Avoid moving your furniture around. If any of your floors are uneven, fix them. If there are any pets around you, be aware of where they are. Review your medicines with your doctor. Some medicines can make you feel dizzy. This can increase your chance of falling. Ask your doctor what other things that you can do to help prevent falls. This information is not intended to replace advice given to you by your health care provider. Make sure you discuss any questions you have with your health care provider. Document Released: 08/28/2009 Document Revised: 04/08/2016 Document Reviewed: 12/06/2014 Elsevier Interactive Patient Education  2017 [employer].

## 2023-09-25 ENCOUNTER — Other Ambulatory Visit: Payer: Self-pay

## 2023-09-25 DIAGNOSIS — F320 Major depressive disorder, single episode, mild: Secondary | ICD-10-CM

## 2023-09-26 MED ORDER — VENLAFAXINE HCL ER 150 MG PO CP24
150.0000 mg | ORAL_CAPSULE | Freq: Every day | ORAL | 1 refills | Status: DC
Start: 2023-09-26 — End: 2023-11-02

## 2023-10-17 ENCOUNTER — Other Ambulatory Visit: Payer: Self-pay

## 2023-10-17 DIAGNOSIS — J449 Chronic obstructive pulmonary disease, unspecified: Secondary | ICD-10-CM

## 2023-10-17 MED ORDER — ALBUTEROL SULFATE (2.5 MG/3ML) 0.083% IN NEBU: INHALATION_SOLUTION | RESPIRATORY_TRACT | 11 refills | Status: DC

## 2023-10-19 ENCOUNTER — Other Ambulatory Visit: Payer: Self-pay | Admitting: Physician Assistant

## 2023-10-19 DIAGNOSIS — K2100 Gastro-esophageal reflux disease with esophagitis, without bleeding: Secondary | ICD-10-CM

## 2023-11-02 ENCOUNTER — Other Ambulatory Visit: Payer: Self-pay | Admitting: Physician Assistant

## 2023-11-02 DIAGNOSIS — F320 Major depressive disorder, single episode, mild: Secondary | ICD-10-CM

## 2023-11-02 MED ORDER — VENLAFAXINE HCL ER 150 MG PO CP24: 150.0000 mg | ORAL_CAPSULE | Freq: Every day | ORAL | 1 refills | Status: DC

## 2023-11-04 ENCOUNTER — Telehealth: Payer: Self-pay

## 2023-11-04 NOTE — Telephone Encounter (Signed)
Copied from CRM [id number]. Topic: General - Other >> Nov 03, 2023  5:00 PM Brenda Patrick wrote: Pt stated that she need a new Nebulizer, because  [facility] wants theirs back and she's left without one.  Fax# [phone] to [facility]

## 2023-11-04 NOTE — Telephone Encounter (Signed)
Does [facility] have a form to fill out and fax over? - if not ok to print rx for nebulizer

## 2023-11-07 ENCOUNTER — Encounter: Payer: Self-pay | Admitting: Physician Assistant

## 2023-11-07 NOTE — Telephone Encounter (Signed)
Order sent.

## 2023-11-19 ENCOUNTER — Other Ambulatory Visit: Payer: Self-pay | Admitting: Physician Assistant

## 2023-11-21 ENCOUNTER — Other Ambulatory Visit: Payer: Self-pay | Admitting: Family Medicine

## 2023-11-21 ENCOUNTER — Ambulatory Visit: Payer: Medicare Other | Admitting: Physician Assistant

## 2023-11-21 ENCOUNTER — Other Ambulatory Visit: Payer: Self-pay | Admitting: Physician Assistant

## 2023-11-21 ENCOUNTER — Encounter: Payer: Self-pay | Admitting: Physician Assistant

## 2023-11-21 VITALS — BP 158/72 | HR 96 | Temp 97.1°F | Ht 60.0 in | Wt 115.0 lb

## 2023-11-21 DIAGNOSIS — K5792 Diverticulitis of intestine, part unspecified, without perforation or abscess without bleeding: Secondary | ICD-10-CM

## 2023-11-21 DIAGNOSIS — N3001 Acute cystitis with hematuria: Secondary | ICD-10-CM | POA: Diagnosis not present

## 2023-11-21 DIAGNOSIS — R1032 Left lower quadrant pain: Secondary | ICD-10-CM | POA: Diagnosis not present

## 2023-11-21 DIAGNOSIS — J449 Chronic obstructive pulmonary disease, unspecified: Secondary | ICD-10-CM

## 2023-11-21 LAB — POCT URINALYSIS DIP (CLINITEK)
Bilirubin, UA: NEGATIVE
Glucose, UA: NEGATIVE mg/dL
Ketones, POC UA: NEGATIVE mg/dL
Nitrite, UA: NEGATIVE
Spec Grav, UA: 1.025 (ref 1.010–1.025)
Urobilinogen, UA: NEGATIVE U/dL — AB
pH, UA: 6 (ref 5.0–8.0)

## 2023-11-21 LAB — COMPREHENSIVE METABOLIC PANEL: EGFR: 86

## 2023-11-21 MED ORDER — LOSARTAN POTASSIUM-HCTZ 100-12.5 MG PO TABS: 1.0000 | ORAL_TABLET | Freq: Every day | ORAL | 0 refills | Status: DC

## 2023-11-21 MED ORDER — CIPROFLOXACIN HCL 500 MG PO TABS: 500.0000 mg | ORAL_TABLET | Freq: Two times a day (BID) | ORAL | 0 refills | Status: DC

## 2023-11-21 MED ORDER — METRONIDAZOLE 500 MG PO TABS: 500.0000 mg | ORAL_TABLET | Freq: Three times a day (TID) | ORAL | 0 refills | Status: DC

## 2023-11-21 NOTE — Progress Notes (Signed)
 Acute Office Visit  Subjective:    Patient ID: Brenda Patrick, female    DOB: Aug 06, 1932, 89 y.o.   MRN: [id number]  Chief Complaint  Patient presents with   Diarrhea with Abdominal pain    HPI: Patient is in today for complaints of abdominal pain for the past 10 days - she has had some diarrhea which has improved slightly.  Had nausea and one episode of vomiting last week.  Has had a low grade temp of 99.  Today is having lower abdominal pain Denies melena or hematochezia Denies vaginal symptoms - has had minimal urine frequency   Current Outpatient Medications:    acetaminophen  (TYLENOL ) 650 MG CR tablet, Take 650 mg by mouth 2 (two) times daily., Disp: , Rfl:    albuterol  (PROVENTIL ) (2.5 MG/3ML) 0.083% nebulizer solution, Use 1 Vial In Nebulizer Every 6 Hours As Needed For Wheezing or Shortness of Breath, Disp: 90 mL, Rfl: 11   Ascorbic Acid (VITAMIN C) 1000 MG tablet, Take 1,000 mg by mouth daily., Disp: , Rfl:    atenolol  (TENORMIN ) 50 MG tablet, TAKE 1 TABLET BY MOUTH DAILY, Disp: 100 tablet, Rfl: 2   atorvastatin  (LIPITOR) 40 MG tablet, TAKE 1 TABLET(40 MG) BY MOUTH DAILY, Disp: 90 tablet, Rfl: 1   busPIRone  (BUSPAR ) 10 MG tablet, TAKE 1 TABLET(10 MG) BY MOUTH THREE TIMES DAILY, Disp: 90 tablet, Rfl: 1   dicyclomine  (BENTYL ) 10 MG capsule, Take 1 capsule (10 mg total) by mouth 3 (three) times daily before meals., Disp: 90 capsule, Rfl: 3   fluticasone  (FLONASE ) 50 MCG/ACT nasal spray, SHAKE LIQUID AND USE 1 SPRAY IN EACH NOSTRIL AT BEDTIME, Disp: 16 g, Rfl: 6   GEMTESA  75 MG TABS, TAKE 1 TABLET(75 MG) BY MOUTH DAILY, Disp: 30 tablet, Rfl: 2   LORazepam  (ATIVAN ) 1 MG tablet, TAKE 1 TABLET(1 MG) BY MOUTH TWICE DAILY AS NEEDED, Disp: 60 tablet, Rfl: 0   losartan -hydrochlorothiazide  (HYZAAR) 100-12.5 MG tablet, TAKE 1 TABLET BY MOUTH DAILY, Disp: 90 tablet, Rfl: 0   Multiple Vitamins-Minerals (MULTIVITAMIN WOMEN 50+ PO), Take 1 tablet by mouth daily., Disp: , Rfl:     omeprazole  (PRILOSEC) 40 MG capsule, TAKE 1 CAPSULE BY MOUTH DAILY, Disp: 100 capsule, Rfl: 1   venlafaxine  XR (EFFEXOR -XR) 150 MG 24 hr capsule, Take 1 capsule (150 mg total) by mouth daily with breakfast., Disp: 90 capsule, Rfl: 1   zinc gluconate 50 MG tablet, Take 50 mg by mouth daily., Disp: , Rfl:   Allergies  Allergen Reactions   Ceftin [Cefuroxime] Diarrhea   Cefuroxime Axetil Diarrhea   Lisinopril Diarrhea    ROS CONSTITUTIONAL: see HPI E/N/T: Negative for ear pain, nasal congestion and sore throat.  CARDIOVASCULAR: Negative for chest pain, dizziness, palpitations and pedal edema.  RESPIRATORY: Negative for recent cough and dyspnea.  GASTROINTESTINAL: see HPI GU- see HPI MSK: Negative for arthralgias and myalgias.  INTEGUMENTARY: Negative for rash.      Objective:    PHYSICAL EXAM:   BP (!) 158/72 (BP Location: Left Arm, Patient Position: Sitting)   Pulse 96   Temp (!) 97.1 F (36.2 C) (Temporal)   Ht 5' (1.524 m)   Wt 115 lb (52.2 kg)   SpO2 96%   BMI 22.46 kg/m    GEN: Well nourished, well developed, in mild discomfort Cardiac: RRR; no murmurs, rubs, or gallops,no edema -  Respiratory:  normal respiratory rate and pattern with no distress - normal breath sounds with no rales, rhonchi, wheezes or  rubs GI: normal bowel sounds, has moderate LLQ tenderness with guarding and rebound noted Skin: warm and dry, no rash   Office Visit on 11/21/2023  Component Date Value Ref Range Status   Glucose, UA 11/21/2023 negative  negative mg/dL Final   Bilirubin, UA 98/93/7974 negative  negative Final   Ketones, POC UA 11/21/2023 negative  negative mg/dL Final   Spec Grav, UA 98/93/7974 1.025  1.010 - 1.025 Final   Blood, UA 11/21/2023 small (A)  negative Final   pH, UA 11/21/2023 6.0  5.0 - 8.0 Final   POC PROTEIN,UA 11/21/2023 trace  negative, trace Final   Urobilinogen, UA 11/21/2023 negative (A)  0.2 or 1.0 E.U./dL Final   Nitrite, UA 98/93/7974 Negative  Negative  Final   Leukocytes, UA 11/21/2023 Small (1+) (A)  Negative Final        Assessment & Plan:    LLQ pain -     CBC with Differential/Platelet -     Comprehensive metabolic panel  Acute hemorrhagic cystitis -     POCT URINALYSIS DIP (CLINITEK) -     Urine Culture  Left lower quadrant abdominal pain -     CT ABDOMEN PELVIS W CONTRAST; Future   Recommend soft diet avoiding seeds and nuts Will treat according to lab/CT results  Follow-up: Return for follow-up in one week.  An After Visit Summary was printed and given to the patient.  [name] [phone]

## 2023-11-22 ENCOUNTER — Ambulatory Visit: Payer: Self-pay | Admitting: Physician Assistant

## 2023-11-22 ENCOUNTER — Encounter: Payer: Self-pay | Admitting: Physician Assistant

## 2023-11-22 ENCOUNTER — Other Ambulatory Visit: Payer: Self-pay | Admitting: Physician Assistant

## 2023-11-22 DIAGNOSIS — R935 Abnormal findings on diagnostic imaging of other abdominal regions, including retroperitoneum: Secondary | ICD-10-CM

## 2023-11-22 DIAGNOSIS — R748 Abnormal levels of other serum enzymes: Secondary | ICD-10-CM

## 2023-11-22 NOTE — Telephone Encounter (Signed)
 Copied from CRM [id number]. Topic: Clinical - Red Word Triage >> Nov 22, 2023  3:30 PM [name] POUR wrote: Red Word that prompted transfer to Nurse Triage: Niece is calling and stating that Brenda Patrick called and said she must go to [facility] to have gallbladder removed today from Dr. Nicholaus Patrick for Disposition  Health Information question, no triage required and triager able to answer question  Answer Assessment - Initial Assessment Questions 1. Patrick FOR CALL or QUESTION: What is your Patrick for calling today? or How can I best help you? or What question do you have that I can help answer?     Patient's niece, Brenda Patrick, emergency contact called with patient present asking for clarification on the provider's instructions given to patient. This RN advised that Brenda Patrick encouraged the patient to go to the ED for evaluation due to recent imaging. Brenda Patrick reports that she is taking the patient to the ED in [facility], likely [facility]. She states she will call back with any further needs.  Protocols used: Information Only Call - No Triage-A-AH

## 2023-11-23 LAB — URINE CULTURE

## 2023-11-25 ENCOUNTER — Telehealth: Payer: Self-pay

## 2023-11-25 NOTE — Transitions of Care (Post Inpatient/ED Visit) (Signed)
 11/25/2023  Name: Brenda Patrick MRN: [id number] DOB: 03-17-1932  Today's TOC FU Call Status: Today's TOC FU Call Status:: Successful TOC FU Call Completed TOC FU Call Complete Date: 11/25/23 Patient's Name and Date of Birth confirmed.  Transition Care Management Follow-up Telephone Call Date of Discharge: 11/24/23 Discharge Facility: Other ([facility]) Name of Other ([facility]) [facility]charge Facility: [facility] Type of Discharge: Inpatient Admission Primary Inpatient Discharge Diagnosis:: gallbladder disease How have you been since you were released from the hospital?: Better Any questions or concerns?: No  Items Reviewed: Did you receive and understand the discharge instructions provided?: Yes Medications obtained,verified, and reconciled?: Yes (Medications Reviewed) Any new allergies since your discharge?: No Dietary orders reviewed?: Yes Do you have support at home?: Yes People in Home: other relative(s)  Medications Reviewed Today: Medications Reviewed Today     Reviewed by Emmitt Pan, LPN (Licensed Practical Nurse) on 11/25/23 at [phone]  Med List Status: <None>   Medication Order Taking? Sig Documenting Provider Last Dose Status Informant  acetaminophen  (TYLENOL ) 650 MG CR tablet [id number] No Take 650 mg by mouth 2 (two) times daily. [provider] Taking Active   albuterol  (PROVENTIL ) (2.5 MG/3ML) 0.083% nebulizer solution [id number]  USE 1 VIAL IN NEBULIZER EVERY 6 HOURS - As Needed For Wheezing or Shortness of Breath Nicholaus Credit, PA-C  Active   Ascorbic Acid (VITAMIN C) 1000 MG tablet [id number] No Take 1,000 mg by mouth daily. [provider] Taking Active   atenolol  (TENORMIN ) 50 MG tablet [id number] No TAKE 1 TABLET BY MOUTH DAILY Nicholaus Credit, PA-C Taking Active   atorvastatin  (LIPITOR) 40 MG tablet [id number] No TAKE 1 TABLET(40 MG) BY MOUTH DAILY Nicholaus Credit, PA-C Taking Active   busPIRone  (BUSPAR ) 10 MG tablet [id number] No TAKE 1  TABLET(10 MG) BY MOUTH THREE TIMES DAILY Nicholaus Credit, PA-C Taking Active   ciprofloxacin  (CIPRO ) 500 MG tablet [id number]  Take 1 tablet (500 mg total) by mouth 2 (two) times daily for 10 days. Nicholaus Credit, PA-C  Active   dicyclomine  (BENTYL ) 10 MG capsule [id number] No Take 1 capsule (10 mg total) by mouth 3 (three) times daily before meals. Nicholaus Credit, PA-C Taking Active   fluticasone  (FLONASE ) 50 MCG/ACT nasal spray [id number] No SHAKE LIQUID AND USE 1 SPRAY IN EACH NOSTRIL AT BEDTIME Nicholaus Credit, PA-C Taking Active   GEMTESA  75 MG TABS [id number] No TAKE 1 TABLET(75 MG) BY MOUTH DAILY Cox, Kirsten, MD Taking Active   LORazepam  (ATIVAN ) 1 MG tablet [id number] No TAKE 1 TABLET(1 MG) BY MOUTH TWICE DAILY AS NEEDED Nicholaus Credit, PA-C Taking Active   losartan -hydrochlorothiazide  (HYZAAR) 100-12.5 MG tablet [id number]  Take 1 tablet by mouth daily. Nicholaus Credit, PA-C  Active   metroNIDAZOLE  (FLAGYL ) 500 MG tablet [id number]  Take 1 tablet (500 mg total) by mouth 3 (three) times daily for 10 days. Nicholaus Credit, PA-C  Active   Multiple Vitamins-Minerals (MULTIVITAMIN WOMEN 50+ PO) [id number] No Take 1 tablet by mouth daily. [provider] Taking Active Self  omeprazole  (PRILOSEC) 40 MG capsule [id number] No TAKE 1 CAPSULE BY MOUTH DAILY Nicholaus Credit, PA-C Taking Active   venlafaxine  XR (EFFEXOR -XR) 150 MG 24 hr capsule [id number] No Take 1 capsule (150 mg total) by mouth daily with breakfast. Nicholaus Credit, PA-C Taking Active   zinc gluconate 50 MG tablet [id number] No Take 50 mg by mouth daily. [provider] Taking Active             Home Care and Equipment/Supplies: Were  Home Health Services Ordered?: NA Any new equipment or medical supplies ordered?: NA  Functional Questionnaire: Do you need assistance with bathing/showering or dressing?: No Do you need assistance with meal preparation?: No Do you need assistance with eating?: No Do you have difficulty maintaining continence: No Do  you need assistance with getting out of bed/getting out of a chair/moving?: No Do you have difficulty managing or taking your medications?: No  Follow up appointments reviewed: PCP Follow-up appointment confirmed?: Yes Date of PCP follow-up appointment?: 11/28/23 Follow-up Provider: [facility] Follow-up appointment confirmed?: NA Do you need transportation to your follow-up appointment?: No Do you understand care options if your condition(s) worsen?: Yes-patient verbalized understanding    SIGNATURE Julian Lemmings, LPN [facility] Nurse Health Advisor Direct Dial [phone]

## 2023-11-28 ENCOUNTER — Ambulatory Visit (INDEPENDENT_AMBULATORY_CARE_PROVIDER_SITE_OTHER): Payer: Medicare Other | Admitting: Physician Assistant

## 2023-11-28 ENCOUNTER — Encounter: Payer: Self-pay | Admitting: Physician Assistant

## 2023-11-28 VITALS — BP 108/70 | HR 80 | Temp 97.2°F | Resp 18 | Ht 60.0 in | Wt 118.8 lb

## 2023-11-28 DIAGNOSIS — K8689 Other specified diseases of pancreas: Secondary | ICD-10-CM | POA: Insufficient documentation

## 2023-11-28 DIAGNOSIS — R1032 Left lower quadrant pain: Secondary | ICD-10-CM | POA: Insufficient documentation

## 2023-11-28 NOTE — Progress Notes (Signed)
 Subjective:  Patient ID: Brenda Patrick, female    DOB: 21-Jul-1932  Age: 89 y.o. MRN: [id number]  Chief Complaint  Patient presents with   Hospitalization Follow-up    HPI  Follow up Hospitalization  Patient was admitted to [facility] on 11/22/23 and discharged on 11/24/23. She was treated for elevated liver enzymes and abdominal pain. Treatment for this included abdomen CT and HIDA scan. Telephone follow up was done on 11/25/23 She reports good compliance with treatment. She reports this condition is improved.  Pt had been seen in our office last week with severe LLQ pain - she had labwork which showed significantly elevated liver enzymes AST at 145 and ALT at 173 - CT of abdomen showed thickening of her gallbladder wall with intra and extrahepatic biliary duct dilatation of pancreatic duct and further workup with ultrasound was recommended The RUQ ultrasound was done which showed hydropic gallbladder -- with her symptoms and those findings she was advised to go to ED for further evaluation She went to [facility] and was admitted - She had a repeat CT abdomen/pelvis and it showed a distended gallbladder, large hiatal hernia and dilatation of pancreatic duct - dedicated pancreatic imaging (MRI) was suggested but not done while she was admitted.  She also had a HIDA scan on 11/23/23 and was found to have patent cystic and common bile ducts but no acute cholecystitis She was discharged on 3 days of augmentin  for abdominal infection? And told to get MRI done as well as follow up with GI Her niece has made her an appt on 12/14/23 with [facility]  Pt states overall she is feeling ok today and her abdominal pain has improved greatly    11/28/2023    2:14 PM 09/21/2023   11:29 AM 09/08/2023   10:33 AM 04/06/2023    9:02 AM 10/06/2022    9:25 AM  Depression screen PHQ 2/9  Decreased Interest 1 1 3 1 2   Down, Depressed, Hopeless 1 1 3 1 1   PHQ - 2 Score 2 2 6 2 3   Altered sleeping 0 0 0  0 0  Tired, decreased energy 1 2 2 1  0  Change in appetite 0 0 2 0   Feeling bad or failure about yourself  0 0 2 1 0  Trouble concentrating 0 1 0 0 0  Moving slowly or fidgety/restless 0 0 0 0 0  Suicidal thoughts 0 0 0 0 0  PHQ-9 Score 3 5 12 4 3   Difficult doing work/chores Not difficult at all Not difficult at all Somewhat difficult Somewhat difficult Not difficult at all        10/06/2022    9:25 AM 04/06/2023    9:01 AM 09/08/2023   10:32 AM 09/21/2023   11:24 AM 11/28/2023    2:14 PM  Fall Risk  Falls in the past year? 0 0 0 0 0  Was there an injury with Fall? 0 0 0 0 0  Fall Risk Category Calculator 0 0 0 0 0  Fall Risk Category (Retired) Low      (RETIRED) Patient Fall Risk Level Low fall risk      Patient at Risk for Falls Due to No Fall Risks No Fall Risks No Fall Risks  No Fall Risks  Fall risk Follow up Falls evaluation completed;Falls prevention discussed Falls evaluation completed Falls evaluation completed Falls evaluation completed;Education provided;Falls prevention discussed Falls evaluation completed     ROS CONSTITUTIONAL: Negative for chills, fatigue,  fever,   CARDIOVASCULAR: Negative for chest pain, dizziness, palpitations and pedal edema.  RESPIRATORY: Negative for recent cough and dyspnea.  GASTROINTESTINAL: see HPI  INTEGUMENTARY: Negative for rash.   PSYCHIATRIC: Negative for sleep disturbance and to question depression screen.  Negative for depression, negative for anhedonia.    Current Outpatient Medications:    acetaminophen  (TYLENOL ) 650 MG CR tablet, Take 650 mg by mouth 2 (two) times daily., Disp: , Rfl:    albuterol  (PROVENTIL ) (2.5 MG/3ML) 0.083% nebulizer solution, USE 1 VIAL IN NEBULIZER EVERY 6 HOURS - As Needed For Wheezing or Shortness of Breath, Disp: 3 mL, Rfl: 11   Ascorbic Acid (VITAMIN C) 1000 MG tablet, Take 1,000 mg by mouth daily., Disp: , Rfl:    atenolol  (TENORMIN ) 50 MG tablet, TAKE 1 TABLET BY MOUTH DAILY, Disp: 100 tablet,  Rfl: 2   atorvastatin  (LIPITOR) 40 MG tablet, TAKE 1 TABLET(40 MG) BY MOUTH DAILY, Disp: 90 tablet, Rfl: 1   dicyclomine  (BENTYL ) 10 MG capsule, Take 1 capsule (10 mg total) by mouth 3 (three) times daily before meals., Disp: 90 capsule, Rfl: 3   fluticasone  (FLONASE ) 50 MCG/ACT nasal spray, SHAKE LIQUID AND USE 1 SPRAY IN EACH NOSTRIL AT BEDTIME, Disp: 16 g, Rfl: 6   GEMTESA  75 MG TABS, TAKE 1 TABLET(75 MG) BY MOUTH DAILY, Disp: 30 tablet, Rfl: 2   LORazepam  (ATIVAN ) 1 MG tablet, TAKE 1 TABLET(1 MG) BY MOUTH TWICE DAILY AS NEEDED, Disp: 60 tablet, Rfl: 0   losartan -hydrochlorothiazide  (HYZAAR) 100-12.5 MG tablet, Take 1 tablet by mouth daily., Disp: 90 tablet, Rfl: 0   Multiple Vitamins-Minerals (MULTIVITAMIN WOMEN 50+ PO), Take 1 tablet by mouth daily., Disp: , Rfl:    omeprazole  (PRILOSEC) 40 MG capsule, TAKE 1 CAPSULE BY MOUTH DAILY, Disp: 100 capsule, Rfl: 1   venlafaxine  XR (EFFEXOR -XR) 150 MG 24 hr capsule, Take 1 capsule (150 mg total) by mouth daily with breakfast., Disp: 90 capsule, Rfl: 1   zinc gluconate 50 MG tablet, Take 50 mg by mouth daily., Disp: , Rfl:   Past Medical History:  Diagnosis Date   Acute bronchitis due to other specified organisms 04/25/2020   COPD (chronic obstructive pulmonary disease) (HCC) 05/26/2020   COVID-19 10/30/2019   Gastroesophageal reflux disease with esophagitis 05/26/2020   Hypertension    Major depressive disorder, single episode, mild (HCC) 05/26/2020   Mixed hyperlipidemia 05/26/2020   Moderate protein-calorie malnutrition (HCC) 05/26/2020   OSA (obstructive sleep apnea) 05/26/2020   Objective:  PHYSICAL EXAM:   BP 108/70 (BP Location: Left Arm, Patient Position: Sitting, Cuff Size: Normal)   Pulse 80   Temp (!) 97.2 F (36.2 C) (Temporal)   Resp 18   Ht 5' (1.524 m)   Wt 118 lb 12.8 oz (53.9 kg)   SpO2 97%   BMI 23.20 kg/m    GEN: Well nourished, well developed, in no acute distress  Cardiac: RRR; no murmurs,  Respiratory:  normal  respiratory rate and pattern with no distress - normal breath sounds with no rales, rhonchi, wheezes or rubs GI: normal bowel sounds, no masses or tenderness  Psych: euthymic mood, appropriate affect and demeanor  Assessment & Plan:    Left lower quadrant abdominal pain -     CBC with Differential/Platelet CMP Follow up with GI as scheduled  Pancreatic duct dilated MRI abdomen - pancreatic protocol    Follow-up: Return for change chronic visit to mid February (instead of end of this month).  An After Visit Summary was  printed and given to the patient.  [name] [phone]

## 2023-11-29 ENCOUNTER — Other Ambulatory Visit: Payer: Self-pay | Admitting: Physician Assistant

## 2023-11-29 ENCOUNTER — Ambulatory Visit: Payer: Medicare Other | Admitting: Physician Assistant

## 2023-11-29 ENCOUNTER — Telehealth: Payer: Self-pay

## 2023-11-29 DIAGNOSIS — R899 Unspecified abnormal finding in specimens from other organs, systems and tissues: Secondary | ICD-10-CM

## 2023-11-29 LAB — COMPREHENSIVE METABOLIC PANEL
ALT: 41 [IU]/L — ABNORMAL HIGH (ref 0–32)
AST: 37 [IU]/L (ref 0–40)
Albumin: 3.9 g/dL (ref 3.7–4.7)
Alkaline Phosphatase: 149 [IU]/L — ABNORMAL HIGH (ref 44–121)
BUN/Creatinine Ratio: 10 — ABNORMAL LOW (ref 12–28)
BUN: 10 mg/dL (ref 8–27)
Bilirubin Total: 0.2 mg/dL (ref 0.0–1.2)
CO2: 26 mmol/L (ref 20–29)
Calcium: 9.3 mg/dL (ref 8.7–10.3)
Chloride: 101 mmol/L (ref 96–106)
Creatinine, Ser: 0.98 mg/dL (ref 0.57–1.00)
Globulin, Total: 2.1 g/dL (ref 1.5–4.5)
Glucose: 91 mg/dL (ref 70–99)
Potassium: 4.3 mmol/L (ref 3.5–5.2)
Sodium: 142 mmol/L (ref 134–144)
Total Protein: 6 g/dL (ref 6.0–8.5)
eGFR: 55 mL/min/{1.73_m2} — ABNORMAL LOW (ref >59)

## 2023-11-29 LAB — CBC WITH DIFFERENTIAL/PLATELET
Basophils Absolute: 0 10*3/uL (ref 0.0–0.2)
Basos: 0 %
EOS (ABSOLUTE): 0.2 10*3/uL (ref 0.0–0.4)
Eos: 2 %
Hematocrit: 34 % (ref 34.0–46.6)
Hemoglobin: 11.5 g/dL (ref 11.1–15.9)
Immature Grans (Abs): 0.1 10*3/uL (ref 0.0–0.1)
Immature Granulocytes: 1 %
Lymphocytes Absolute: 2.8 10*3/uL (ref 0.7–3.1)
Lymphs: 26 %
MCH: 33.2 pg — ABNORMAL HIGH (ref 26.6–33.0)
MCHC: 33.8 g/dL (ref 31.5–35.7)
MCV: 98 fL — ABNORMAL HIGH (ref 79–97)
Monocytes Absolute: 0.8 10*3/uL (ref 0.1–0.9)
Monocytes: 8 %
Neutrophils Absolute: 6.6 10*3/uL (ref 1.4–7.0)
Neutrophils: 63 %
Platelets: 466 10*3/uL — ABNORMAL HIGH (ref 150–450)
RBC: 3.46 x10E6/uL — ABNORMAL LOW (ref 3.77–5.28)
RDW: 11.8 % (ref 11.7–15.4)
WBC: 10.5 10*3/uL (ref 3.4–10.8)

## 2023-11-29 NOTE — Telephone Encounter (Signed)
 Called patient to get more information of what's going on, was not able to leave message due to her phone just ringing.

## 2023-12-01 ENCOUNTER — Ambulatory Visit: Payer: Self-pay | Admitting: Physician Assistant

## 2023-12-01 NOTE — Telephone Encounter (Signed)
Chief Complaint: mild blood in stool Symptoms: blood in stool and painful bowel movement Frequency: twice Disposition: [] ED /[] Urgent Care (no appt availability in office) / [] Appointment(In office/virtual)/ []  [facility]/ [x] Home Care/ [] Refused Recommended Disposition /[] [facility]/ []  Follow-up with PCP Additional Notes: Patient called in stating she was recently released from hospital and treated with IV abx for intestinal infection. Patient has already had follow up appointment in office with PC on 11/29/23. However; since yesterday, patient has had continued diarrhea but new scant amounts of bright red blood and pain x2 episodes. Patient does confirm history of hemorrhoids and compares this pain to past episodes of hemorrhoids. Advised patient to try hemorrhoid cream and treatment. However; due to recent medical history, advised patient this RN will send high priority message to PCP to determine if patient needs to be seen and evaluated in office. Please contact patient via MyChart message or main phone number if evaluation is recommended at this time.   Copied from CRM [id number]. Topic: Clinical - Red Word Triage >> Dec 01, 2023  2:49 PM Alvino Blood C wrote: Red Word that prompted transfer to Nurse Triage: Patient has been having blood in her stool for the past few days. Patient has been back and fourth to the restroom with pain and discomfort. Reason for Disposition  Rectal bleeding is minimal (e.g., blood just on toilet paper, a few drops in toilet bowl)  Answer Assessment - Initial Assessment Questions 1. APPEARANCE of BLOOD: "What color is it?" "Is it passed separately, on the surface of the stool, or mixed in with the stool?"      Bright red 2. AMOUNT: "How much blood was passed?"      Wasn't much 3. FREQUENCY: "How many times has blood been passed with the stools?"      Two 4. ONSET: "When was the blood first seen in the stools?" (Days or weeks)       Yesterday 5. DIARRHEA: "Is there also some diarrhea?" If Yes, ask: "How many diarrhea stools in the past 24 hours?"      6 times 6. CONSTIPATION: "Do you have constipation?" If Yes, ask: "How bad is it?"     Not in last month 7. RECURRENT SYMPTOMS: "Have you had blood in your stools before?" If Yes, ask: "When was the last time?" and "What happened that time?"      Not to my knowledge 8. BLOOD THINNERS: "Do you take any blood thinners?" (e.g., Coumadin/warfarin, Pradaxa/dabigatran, aspirin)     No 9. OTHER SYMPTOMS: "Do you have any other symptoms?"  (e.g., abdomen pain, vomiting, dizziness, fever)     Abdominal pain  Protocols used: Rectal Bleeding-A-AH

## 2023-12-02 ENCOUNTER — Telehealth: Payer: Self-pay

## 2023-12-02 NOTE — Transitions of Care (Post Inpatient/ED Visit) (Signed)
12/02/2023  Name: Brenda Patrick MRN: [id number] DOB: [birth year]  Today's TOC FU Call Status: Today's TOC FU Call Status:: Successful TOC FU Call Completed Unsuccessful Call (1st Attempt) Date: 12/02/23 [facility] FU Call Complete Date: 12/02/23 Patient's Name and Date of Birth confirmed.  Transition Care Management Follow-up Telephone Call Date of Discharge: 12/01/23 Discharge Facility: Other [facility]) Name of Other ([facility]) Discharge Facility: [facility] Type of Discharge: Emergency Department Reason for ED Visit: Other: (Diarrhea, Abdominal pain) How have you been since you were released from the hospital?: Same Any questions or concerns?: No  Items Reviewed: Did you receive and understand the discharge instructions provided?: Yes Medications obtained,verified, and reconciled?: Yes (Medications Reviewed) Any new allergies since your discharge?: No Dietary orders reviewed?: Yes Do you have support at home?: Yes People in Home: other relative(s)  Medications Reviewed Today: Medications Reviewed Today     Reviewed by Jacklynn Bue, LPN (Licensed Practical Nurse) on 12/02/23 at [phone]  Med List Status: <None>   Medication Order Taking? Sig Documenting Provider Last Dose Status Informant  acetaminophen (TYLENOL) 650 MG CR tablet [id number] Yes Take 650 mg by mouth 2 (two) times daily. [provider] Taking Active   albuterol (PROVENTIL) (2.5 MG/3ML) 0.083% nebulizer solution [id number] Yes USE 1 VIAL IN NEBULIZER EVERY 6 HOURS - As Needed For Wheezing or Shortness of Breath Marianne Sofia, PA-C Taking Active   Ascorbic Acid (VITAMIN C) 1000 MG tablet [id number] Yes Take 1,000 mg by mouth daily. [provider] Taking Active   atenolol (TENORMIN) 50 MG tablet [id number] Yes TAKE 1 TABLET BY MOUTH DAILY Marianne Sofia, PA-C Taking Active   atorvastatin (LIPITOR) 40 MG tablet [id number] Yes TAKE 1 TABLET(40 MG) BY MOUTH DAILY Marianne Sofia, PA-C Taking Active    dicyclomine (BENTYL) 10 MG capsule [id number] Yes Take 1 capsule (10 mg total) by mouth 3 (three) times daily before meals. Marianne Sofia, PA-C Taking Active   fluticasone [facility]) 50 MCG/ACT nasal spray [id number] Yes SHAKE LIQUID AND USE 1 SPRAY IN EACH NOSTRIL AT BEDTIME Marianne Sofia, PA-C Taking Active   GEMTESA 75 MG TABS [id number] Yes TAKE 1 TABLET(75 MG) BY MOUTH DAILY Cox, Kirsten, MD Taking Active   hydrocortisone (ANUSOL-HC) 25 MG suppository [id number] Yes Place 25 mg rectally 2 (two) times daily. [provider]  Active   levofloxacin (LEVAQUIN) 500 MG tablet [id number] Yes Take 500 mg by mouth daily. [provider]  Active   LORazepam (ATIVAN) 1 MG tablet [id number] Yes TAKE 1 TABLET(1 MG) BY MOUTH TWICE DAILY AS NEEDED Marianne Sofia, PA-C Taking Active   losartan-hydrochlorothiazide (HYZAAR) 100-12.5 MG tablet [id number] Yes Take 1 tablet by mouth daily. Marianne Sofia, PA-C Taking Active   metroNIDAZOLE (FLAGYL) 500 MG tablet [id number] Yes Take 500 mg by mouth 2 (two) times daily. [provider]  Active   Multiple Vitamins-Minerals (MULTIVITAMIN WOMEN 50+ PO) [id number] Yes Take 1 tablet by mouth daily. [provider] Taking Active Self  omeprazole (PRILOSEC) 40 MG capsule [id number] Yes TAKE 1 CAPSULE BY MOUTH DAILY Marianne Sofia, PA-C Taking Active   oxycodone (OXY-IR) 5 MG capsule [id number]  Take 5 mg by mouth every 6 (six) hours as needed for pain. [provider]  Active   venlafaxine XR (EFFEXOR-XR) 150 MG 24 hr capsule [id number] Yes Take 1 capsule (150 mg total) by mouth daily with breakfast. Marianne Sofia, PA-C Taking Active   zinc gluconate 50 MG tablet [id number] Yes Take 50 mg by mouth daily. [provider] Taking Active             Home Care and Equipment/Supplies: Were Home Health Services Ordered?: NA Any new equipment or medical supplies ordered?: NA  Functional Questionnaire: Do you need assistance with bathing/showering  or dressing?: No Do you need assistance with meal preparation?: No Do you need assistance with eating?: No Do you have difficulty maintaining continence: No Do you need assistance with getting out of bed/getting out of a chair/moving?: No Do you have difficulty managing or taking your medications?: No  Follow up appointments reviewed: PCP Follow-up appointment confirmed?: Yes Date of PCP follow-up appointment?: 12/06/23 Follow-up Provider: [facility] Follow-up appointment confirmed?: Yes Date of Specialist follow-up appointment?: 12/14/23 Follow-Up Specialty Provider:: GI Do you need transportation to your follow-up appointment?: No Do you understand care options if your condition(s) worsen?: Yes-patient verbalized understanding    SIGNATURE: Creola Corn, LPN  16/10/96 0:45 AM

## 2023-12-02 NOTE — Transitions of Care (Post Inpatient/ED Visit) (Signed)
   12/02/2023  Name: Brenda Patrick MRN: [id number] DOB: 04/05/1932  Today's TOC FU Call Status: Today's TOC FU Call Status:: Unsuccessful Call (1st Attempt) Unsuccessful Call (1st Attempt) Date: 12/02/23  Attempted to reach the patient regarding the most recent Inpatient/ED visit.    I spoke with provider about ER visit on 12/01/23 and provider would like patient to follow up in the office early next week for stool studies.  Patient's provider won't be in the office so she can see another provider in our office.  Follow Up Plan: Additional outreach attempts will be made to reach the patient to complete the Transitions of Care (Post Inpatient/ED visit) call.   Signature Jacklynn Bue, LPN  81/82/99 3:71 AM

## 2023-12-06 ENCOUNTER — Ambulatory Visit: Payer: Medicare Other | Admitting: Physician Assistant

## 2023-12-08 ENCOUNTER — Telehealth: Payer: Self-pay

## 2023-12-08 NOTE — Transitions of Care (Post Inpatient/ED Visit) (Signed)
   12/08/2023  Name: DESTINIE DONA MRN: [id number] DOB: 23-Sep-1932  Today's TOC FU Call Status: Today's TOC FU Call Status:: Unsuccessful Call (1st Attempt) Unsuccessful Call (1st Attempt) Date: 12/08/23  Attempted to reach the patient regarding the most recent Inpatient/ED visit.  Follow Up Plan: Additional outreach attempts will be made to reach the patient to complete the Transitions of Care (Post Inpatient/ED visit) call.   Signature Creola Corn, LPN  40/98/11 91:47 PM

## 2023-12-09 ENCOUNTER — Other Ambulatory Visit: Payer: Self-pay | Admitting: Physician Assistant

## 2023-12-09 ENCOUNTER — Telehealth: Payer: Self-pay | Admitting: Family Medicine

## 2023-12-09 DIAGNOSIS — F418 Other specified anxiety disorders: Secondary | ICD-10-CM

## 2023-12-09 MED ORDER — LORAZEPAM 1 MG PO TABS: ORAL_TABLET | ORAL | 0 refills | Status: DC

## 2023-12-09 NOTE — Telephone Encounter (Signed)
Copied from CRM [id number]. Topic: Clinical - Medication Refill >> Dec 09, 2023 10:55 AM Dimitri Ped wrote: Most Recent Primary Care Visit:  Provider: Marianne Sofia  Department: [facility]  Visit Type: HOSPITAL FOLLOW UP  Date: 11/28/2023  Medication: LORazepam (ATIVAN) 1 MG tablet   Has the patient contacted their pharmacy? No switching pharmacies (Agent: If no, request that the patient contact the pharmacy for the refill. If patient does not wish to contact the pharmacy document the reason why and proceed with request.) (Agent: If yes, when and what did the pharmacy advise?)  Is this the correct pharmacy for this prescription? Yes If no, delete pharmacy and type the correct one.  This is the patient's preferred pharmacy:  [id number], [place] - [address] [place] [address] Phone: [phone] Fax: [phone]  [facility]) - [place], [place] - [address] [facility] [place] [address] [place] [address] [address] Phone: [phone] Fax: [phone]  [facility] Delivery - [place], [place] - [address] [place] [address] Phone: [phone] Fax: [phone]  [facility] - [place], [place] - [address] [facility] [employer]. [address] [place] [address] Phone: [phone] Fax: [phone]   Has the prescription been filled recently? No  Is the patient out of the medication? No 2 pills left don't want to run out  Has the patient been seen for an appointment in the last year OR does the patient have an upcoming appointment? Yes  Can we respond through MyChart? No  Agent: Please be advised that Rx refills may take up to 3 business days. We ask that you follow-up with your pharmacy.

## 2023-12-09 NOTE — Transitions of Care (Post Inpatient/ED Visit) (Signed)
12/09/2023  Name: Brenda Patrick MRN: [id number] DOB: Sep 24, 1932  Today's TOC FU Call Status: Today's TOC FU Call Status:: Successful TOC FU Call Completed Unsuccessful Call (1st Attempt) Date: 12/08/23 [facility] FU Call Complete Date: 12/09/23 Patient's Name and Date of Birth confirmed.  Transition Care Management Follow-up Telephone Call Date of Discharge: 12/07/23 Discharge Facility: Other ([facility]) Name of Other ([facility]) Discharge Facility: [facility] Type of Discharge: Inpatient Admission Primary Inpatient Discharge Diagnosis:: C-Diff How have you been since you were released from the hospital?: Better Any questions or concerns?: No  Patient states that she is doing much better and getting her strength back.  She will see GI 12/14/23  Items Reviewed: Did you receive and understand the discharge instructions provided?: Yes Medications obtained,verified, and reconciled?: Yes (Medications Reviewed) Any new allergies since your discharge?: No Dietary orders reviewed?: Yes Type of Diet Ordered:: bland Do you have support at home?: Yes People in Home: other relative(s)  Medications Reviewed Today: Medications Reviewed Today     Reviewed by Jacklynn Bue, LPN (Licensed Practical Nurse) on 12/09/23 at 1159  Med List Status: <None>   Medication Order Taking? Sig Documenting Provider Last Dose Status Informant  acetaminophen (TYLENOL) 650 MG CR tablet [id number] Yes Take 650 mg by mouth 2 (two) times daily. [provider] Taking Active   albuterol (PROVENTIL) (2.5 MG/3ML) 0.083% nebulizer solution [id number] Yes USE 1 VIAL IN NEBULIZER EVERY 6 HOURS - As Needed For Wheezing or Shortness of Breath Marianne Sofia, PA-C Taking Active   Ascorbic Acid (VITAMIN C) 1000 MG tablet [id number] Yes Take 1,000 mg by mouth daily. [provider] Taking Active   atenolol (TENORMIN) 50 MG tablet [id number] Yes TAKE 1 TABLET BY MOUTH DAILY Marianne Sofia, PA-C Taking  Active   atorvastatin (LIPITOR) 40 MG tablet [id number] Yes TAKE 1 TABLET(40 MG) BY MOUTH DAILY Marianne Sofia, PA-C Taking Active   dicyclomine (BENTYL) 10 MG capsule [id number] Yes Take 1 capsule (10 mg total) by mouth 3 (three) times daily before meals. Marianne Sofia, PA-C Taking Active   fidaxomicin (DIFICID) 200 MG TABS tablet [id number] Yes Take 200 mg by mouth 2 (two) times daily. [provider] Taking Active   fluticasone (FLONASE) 50 MCG/ACT nasal spray [id number] Yes SHAKE LIQUID AND USE 1 SPRAY IN EACH NOSTRIL AT BEDTIME Marianne Sofia, PA-C Taking Active   GEMTESA 75 MG TABS [id number] Yes TAKE 1 TABLET(75 MG) BY MOUTH DAILY Cox, Kirsten, MD Taking Active   hydrocortisone (ANUSOL-HC) 25 MG suppository [id number] Yes Place 25 mg rectally 2 (two) times daily. [provider] Taking Active   levofloxacin (LEVAQUIN) 500 MG tablet [id number] Yes Take 500 mg by mouth daily. [provider] Taking Active   LORazepam (ATIVAN) 1 MG tablet [id number] Yes TAKE 1 TABLET(1 MG) BY MOUTH TWICE DAILY AS NEEDED Marianne Sofia, PA-C Taking Active   losartan-hydrochlorothiazide (HYZAAR) 100-12.5 MG tablet [id number] Yes Take 1 tablet by mouth daily. Marianne Sofia, PA-C Taking Active   metroNIDAZOLE (FLAGYL) 500 MG tablet [id number] Yes Take 500 mg by mouth 2 (two) times daily. [provider] Taking Active   Multiple Vitamins-Minerals (MULTIVITAMIN WOMEN 50+ PO) [id number] Yes Take 1 tablet by mouth daily. [provider] Taking Active Self  omeprazole (PRILOSEC) 40 MG capsule [id number] Yes TAKE 1 CAPSULE BY MOUTH DAILY Marianne Sofia, PA-C Taking Active   oxycodone (OXY-IR) 5 MG capsule [id number] Yes Take 5 mg by mouth every 6 (six) hours as needed for pain. [provider] Taking  Active   potassium chloride (KLOR-CON M) 10 MEQ tablet [id number] Yes Take 10 mEq by mouth 2 (two) times daily. [provider] Taking Active   venlafaxine XR (EFFEXOR-XR) 150 MG 24 hr capsule  [id number] Yes Take 1 capsule (150 mg total) by mouth daily with breakfast. Marianne Sofia, PA-C Taking Active   zinc gluconate 50 MG tablet [id number] Yes Take 50 mg by mouth daily. [provider] Taking Active             Home Care and Equipment/Supplies: Were Home Health Services Ordered?: No Any new equipment or medical supplies ordered?: No  Functional Questionnaire: Do you need assistance with bathing/showering or dressing?: No Do you need assistance with meal preparation?: No Do you need assistance with eating?: No Do you have difficulty maintaining continence: No Do you need assistance with getting out of bed/getting out of a chair/moving?: No Do you have difficulty managing or taking your medications?: No  Follow up appointments reviewed: PCP Follow-up appointment confirmed?: NA (Patient has [facility] appt and will follow up with them per provider) Specialist Hospital Follow-up appointment confirmed?: Yes Date of Specialist follow-up appointment?: 12/14/23 Follow-Up Specialty Provider:: Dr Misenheimer/GI Do you need transportation to your follow-up appointment?: No Do you understand care options if your condition(s) worsen?: Yes-patient verbalized understanding    SIGNATURE: Creola Corn, LPN  96/29/52 84:13 AM

## 2023-12-09 NOTE — Telephone Encounter (Signed)
Brenda Patrick called on behalf of of her aunt Brenda Patrick. The patient was recently discharged from the hospital on Thursday. She was diagnosed with Cdiff and is on vancomycin and flagyl. The patient started having some swelling in her legs and a slight headache. I asked the patient to take her BP and the series of BP were as follows: 176/81 pulse 84 @ 9:47 pm 166/72 pulse 82 @ 9:52 pm 166/75 pulse 85 @ 10:03 pm 140/68 @ 1013 pm I advised the patient to take another Atenolol 50 mg by mouth this evening and to take this blood pressure medication TWICE A DAY until Monday. I scheduled her an appointment with her provider, Gerre Pebbles, PA.  I advised the patient to continue to check her BP at least 2-3 times a day. Instructed the patient to go to the hospital if she starts to develop shortness of breath, chest pain and extremely high fevers.

## 2023-12-12 ENCOUNTER — Ambulatory Visit: Payer: Medicare Other | Admitting: Physician Assistant

## 2023-12-12 ENCOUNTER — Ambulatory Visit (INDEPENDENT_AMBULATORY_CARE_PROVIDER_SITE_OTHER): Payer: Medicare Other | Admitting: Physician Assistant

## 2023-12-12 ENCOUNTER — Encounter: Payer: Self-pay | Admitting: Physician Assistant

## 2023-12-12 VITALS — BP 118/68 | HR 72 | Temp 97.5°F | Resp 18 | Ht 60.0 in | Wt 114.4 lb

## 2023-12-12 DIAGNOSIS — E876 Hypokalemia: Secondary | ICD-10-CM

## 2023-12-12 DIAGNOSIS — A0472 Enterocolitis due to Clostridium difficile, not specified as recurrent: Secondary | ICD-10-CM

## 2023-12-12 DIAGNOSIS — I10 Essential (primary) hypertension: Secondary | ICD-10-CM

## 2023-12-12 MED ORDER — ATENOLOL 50 MG PO TABS: 50.0000 mg | ORAL_TABLET | Freq: Two times a day (BID) | ORAL | Status: DC

## 2023-12-12 NOTE — Progress Notes (Signed)
Subjective:  Patient ID: Brenda Patrick, female    DOB: [birth year]  Age: 89 y.o. MRN: [id number]  Chief Complaint  Patient presents with   Hospitalization Follow-up    HPI Follow up Hospitalization  Patient was admitted to [facility] on 12/04/23 and discharged on 12/07/23. She was treated for C diff.and found to have low potassium Treatment for this included flagyl and vancomycin -- also potassium supplement. Telephone follow up was done on 12/08/23 She reports excellent compliance with treatment. She reports this condition is improved. Pt already has GI appt scheduled for later this week that was made prior to being admitted to hospital  Also last week her family called provider on call with pt having elevated bp readings and lower extremity swelling. She was advised to increase atenolol to 25mg  bid which she has been doing the last several days.  She is tolerating medication well and readings have been lower Denies any swelling today    11/28/2023    2:14 PM 09/21/2023   11:29 AM 09/08/2023   10:33 AM 04/06/2023    9:02 AM 10/06/2022    9:25 AM  Depression screen PHQ 2/9  Decreased Interest 1 1 3 1 2   Down, Depressed, Hopeless 1 1 3 1 1   PHQ - 2 Score 2 2 6 2 3   Altered sleeping 0 0 0 0 0  Tired, decreased energy 1 2 2 1  0  Change in appetite 0 0 2 0   Feeling bad or failure about yourself  0 0 2 1 0  Trouble concentrating 0 1 0 0 0  Moving slowly or fidgety/restless 0 0 0 0 0  Suicidal thoughts 0 0 0 0 0  PHQ-9 Score 3 5 12 4 3   Difficult doing work/chores Not difficult at all Not difficult at all Somewhat difficult Somewhat difficult Not difficult at all        10/06/2022    9:25 AM 04/06/2023    9:01 AM 09/08/2023   10:32 AM 09/21/2023   11:24 AM 11/28/2023    2:14 PM  Fall Risk  Falls in the past year? 0 0 0 0 0  Was there an injury with Fall? 0 0 0 0 0  Fall Risk Category Calculator 0 0 0 0 0  Fall Risk Category (Retired) Low      (RETIRED) Patient  Fall Risk Level Low fall risk      Patient at Risk for Falls Due to No Fall Risks No Fall Risks No Fall Risks  No Fall Risks  Fall risk Follow up Falls evaluation completed;Falls prevention discussed Falls evaluation completed Falls evaluation completed Falls evaluation completed;Education provided;Falls prevention discussed Falls evaluation completed     ROS CONSTITUTIONAL: Negative for chills, fatigue, fever, unintentional weight gain and unintentional weight loss.  E/N/T: Negative for ear pain, nasal congestion and sore throat.  CARDIOVASCULAR: Negative for chest pain, dizziness, palpitations and pedal edema.  RESPIRATORY: Negative for recent cough and dyspnea.  GASTROINTESTINAL: she is not having any abdominal pain and having very few loose bms now    Current Outpatient Medications:    acetaminophen (TYLENOL) 650 MG CR tablet, Take 650 mg by mouth 2 (two) times daily., Disp: , Rfl:    albuterol (PROVENTIL) (2.5 MG/3ML) 0.083% nebulizer solution, USE 1 VIAL IN NEBULIZER EVERY 6 HOURS - As Needed For Wheezing or Shortness of Breath, Disp: 3 mL, Rfl: 11   Ascorbic Acid (VITAMIN C) 1000 MG tablet, Take 1,000 mg by mouth daily., Disp: ,  Rfl:    atenolol (TENORMIN) 50 MG tablet, Take 1 tablet (50 mg total) by mouth 2 (two) times daily., Disp: , Rfl:    atorvastatin (LIPITOR) 40 MG tablet, TAKE 1 TABLET(40 MG) BY MOUTH DAILY, Disp: 90 tablet, Rfl: 1   dicyclomine (BENTYL) 10 MG capsule, Take 1 capsule (10 mg total) by mouth 3 (three) times daily before meals., Disp: 90 capsule, Rfl: 3   fidaxomicin (DIFICID) 200 MG TABS tablet, Take 200 mg by mouth 2 (two) times daily., Disp: , Rfl:    fluticasone (FLONASE) 50 MCG/ACT nasal spray, SHAKE LIQUID AND USE 1 SPRAY IN EACH NOSTRIL AT BEDTIME, Disp: 16 g, Rfl: 6   GEMTESA 75 MG TABS, TAKE 1 TABLET(75 MG) BY MOUTH DAILY, Disp: 30 tablet, Rfl: 2   hydrocortisone (ANUSOL-HC) 25 MG suppository, Place 25 mg rectally 2 (two) times daily., Disp: , Rfl:     LORazepam (ATIVAN) 1 MG tablet, TAKE 1 TABLET(1 MG) BY MOUTH TWICE DAILY AS NEEDED, Disp: 60 tablet, Rfl: 0   losartan-hydrochlorothiazide (HYZAAR) 100-12.5 MG tablet, Take 1 tablet by mouth daily., Disp: 90 tablet, Rfl: 0   Multiple Vitamins-Minerals (MULTIVITAMIN WOMEN 50+ PO), Take 1 tablet by mouth daily., Disp: , Rfl:    omeprazole (PRILOSEC) 40 MG capsule, TAKE 1 CAPSULE BY MOUTH DAILY, Disp: 100 capsule, Rfl: 1   ondansetron (ZOFRAN-ODT) 4 MG disintegrating tablet, Take 4 mg by mouth every 6 (six) hours as needed., Disp: , Rfl:    potassium chloride (KLOR-CON M) 10 MEQ tablet, Take 10 mEq by mouth 2 (two) times daily., Disp: , Rfl:    vancomycin (VANCOCIN) 125 MG capsule, Take 125 mg by mouth every 6 (six) hours., Disp: , Rfl:    venlafaxine XR (EFFEXOR-XR) 150 MG 24 hr capsule, Take 1 capsule (150 mg total) by mouth daily with breakfast., Disp: 90 capsule, Rfl: 1   zinc gluconate 50 MG tablet, Take 50 mg by mouth daily., Disp: , Rfl:   Past Medical History:  Diagnosis Date   Acute bronchitis due to other specified organisms 04/25/2020   COPD (chronic obstructive pulmonary disease) (HCC) 05/26/2020   COVID-19 10/30/2019   Gastroesophageal reflux disease with esophagitis 05/26/2020   Hypertension    Major depressive disorder, single episode, mild (HCC) 05/26/2020   Mixed hyperlipidemia 05/26/2020   Moderate protein-calorie malnutrition (HCC) 05/26/2020   OSA (obstructive sleep apnea) 05/26/2020   Objective:  PHYSICAL EXAM:   BP 118/68 (BP Location: Left Arm, Patient Position: Sitting, Cuff Size: Normal)   Pulse 72   Temp (!) 97.5 F (36.4 C) (Temporal)   Resp 18   Ht 5' (1.524 m)   Wt 114 lb 6.4 oz (51.9 kg)   SpO2 100%   BMI 22.34 kg/m  BP Readings from Last 3 Encounters:  12/12/23 118/68  11/28/23 108/70  11/21/23 (!) 158/72      GEN: Well nourished, well developed, in no acute distress  Cardiac: RRR; no murmurs, rubs, or gallops,no edema  Respiratory:  normal respiratory  rate and pattern with no distress - normal breath sounds with no rales, rhonchi, wheezes or rubs GI: normal bowel sounds, no masses or tenderness Skin: warm and dry, no rash   Psych: euthymic mood, appropriate affect and demeanor  Assessment & Plan:    C. difficile colitis -     CBC with Differential/Platelet Continue current meds as directed Hypokalemia -     Comprehensive metabolic panel  Essential hypertension -     Atenolol; Take 1 tablet (50  mg total) by mouth 2 (two) times daily.     Follow-up: Return for at next scheduled follow up visit.on 01/10/24  An After Visit Summary was printed and given to the patient.  Jettie Pagan [facility] [phone]

## 2023-12-13 ENCOUNTER — Ambulatory Visit: Payer: Medicare Other | Admitting: Physician Assistant

## 2023-12-13 LAB — COMPREHENSIVE METABOLIC PANEL
ALT: 17 [IU]/L (ref 0–32)
AST: 21 [IU]/L (ref 0–40)
Albumin: 3.3 g/dL — ABNORMAL LOW (ref 3.7–4.7)
Alkaline Phosphatase: 94 [IU]/L (ref 44–121)
BUN/Creatinine Ratio: 8 — ABNORMAL LOW (ref 12–28)
BUN: 6 mg/dL — ABNORMAL LOW (ref 8–27)
Bilirubin Total: 0.3 mg/dL (ref 0.0–1.2)
CO2: 24 mmol/L (ref 20–29)
Calcium: 9.3 mg/dL (ref 8.7–10.3)
Chloride: 103 mmol/L (ref 96–106)
Creatinine, Ser: 0.74 mg/dL (ref 0.57–1.00)
Globulin, Total: 2.3 g/dL (ref 1.5–4.5)
Glucose: 123 mg/dL — ABNORMAL HIGH (ref 70–99)
Potassium: 5.2 mmol/L (ref 3.5–5.2)
Sodium: 142 mmol/L (ref 134–144)
Total Protein: 5.6 g/dL — ABNORMAL LOW (ref 6.0–8.5)
eGFR: 77 mL/min/{1.73_m2} (ref >59)

## 2023-12-13 LAB — CBC WITH DIFFERENTIAL/PLATELET
Basophils Absolute: 0.1 10*3/uL (ref 0.0–0.2)
Basos: 1 %
EOS (ABSOLUTE): 0.2 10*3/uL (ref 0.0–0.4)
Eos: 1 %
Hematocrit: 35.5 % (ref 34.0–46.6)
Hemoglobin: 11.6 g/dL (ref 11.1–15.9)
Immature Grans (Abs): 0.3 10*3/uL — ABNORMAL HIGH (ref 0.0–0.1)
Immature Granulocytes: 3 %
Lymphocytes Absolute: 2.3 10*3/uL (ref 0.7–3.1)
Lymphs: 21 %
MCH: 32.4 pg (ref 26.6–33.0)
MCHC: 32.7 g/dL (ref 31.5–35.7)
MCV: 99 fL — ABNORMAL HIGH (ref 79–97)
Monocytes Absolute: 0.9 10*3/uL (ref 0.1–0.9)
Monocytes: 8 %
Neutrophils Absolute: 7.6 10*3/uL — ABNORMAL HIGH (ref 1.4–7.0)
Neutrophils: 66 %
Platelets: 528 10*3/uL — ABNORMAL HIGH (ref 150–450)
RBC: 3.58 x10E6/uL — ABNORMAL LOW (ref 3.77–5.28)
RDW: 12.4 % (ref 11.7–15.4)
WBC: 11.4 10*3/uL — ABNORMAL HIGH (ref 3.4–10.8)

## 2023-12-15 ENCOUNTER — Other Ambulatory Visit: Payer: Self-pay

## 2023-12-15 DIAGNOSIS — R935 Abnormal findings on diagnostic imaging of other abdominal regions, including retroperitoneum: Secondary | ICD-10-CM

## 2024-01-02 ENCOUNTER — Other Ambulatory Visit: Payer: Self-pay | Admitting: Physician Assistant

## 2024-01-02 DIAGNOSIS — E782 Mixed hyperlipidemia: Secondary | ICD-10-CM

## 2024-01-05 ENCOUNTER — Inpatient Hospital Stay: Admission: RE | Admit: 2024-01-05 | Payer: Medicare Other | Source: Ambulatory Visit

## 2024-01-10 ENCOUNTER — Ambulatory Visit (INDEPENDENT_AMBULATORY_CARE_PROVIDER_SITE_OTHER): Payer: Medicare Other | Admitting: Physician Assistant

## 2024-01-10 ENCOUNTER — Encounter: Payer: Self-pay | Admitting: Physician Assistant

## 2024-01-10 VITALS — BP 102/64 | HR 72 | Temp 97.5°F | Resp 18 | Ht 60.0 in | Wt 107.0 lb

## 2024-01-10 DIAGNOSIS — R739 Hyperglycemia, unspecified: Secondary | ICD-10-CM

## 2024-01-10 DIAGNOSIS — K2100 Gastro-esophageal reflux disease with esophagitis, without bleeding: Secondary | ICD-10-CM | POA: Diagnosis not present

## 2024-01-10 DIAGNOSIS — K8689 Other specified diseases of pancreas: Secondary | ICD-10-CM

## 2024-01-10 DIAGNOSIS — F320 Major depressive disorder, single episode, mild: Secondary | ICD-10-CM

## 2024-01-10 DIAGNOSIS — I10 Essential (primary) hypertension: Secondary | ICD-10-CM | POA: Diagnosis not present

## 2024-01-10 DIAGNOSIS — E559 Vitamin D deficiency, unspecified: Secondary | ICD-10-CM

## 2024-01-10 DIAGNOSIS — E782 Mixed hyperlipidemia: Secondary | ICD-10-CM

## 2024-01-10 DIAGNOSIS — F418 Other specified anxiety disorders: Secondary | ICD-10-CM

## 2024-01-10 NOTE — Progress Notes (Signed)
 Subjective:  Patient ID: Brenda Patrick, female    DOB: 05/07/1932  Age: 89 y.o. MRN: [id number]  Chief Complaint  Patient presents with   Medical Management of Chronic Issues      Pt with history of hypertension - She is currently on tenormin 50mg  qd and hyzaar 100/12.5mg   She denies chest pain/sob/edema/palpitations  Pt with history of hyperlipidemia - she is currently on lipitor 40mg  qd  -she is due for labwork States she does try to watch her diet  Pt with history of depression with anxiety - pt is currently taking effexor 150 mg,and lorazepam as needed.    Pt with history of GERD - stable on prilosec 40mg  qd  Pt with history of stress incontinence - doing well on Gemtesa but pt will be contacting urologist because the cost is too much  Pt with COPD - she uses a nebulizer prn and uses oxygen at night - states several years ago had low readings at night with oxygen levels and started using -   Pt has recovered from having Cdiff - she has seen Dr Jennye Boroughs for a consult and will be having a MRI 01/20/24 for dilated pancreatic duct Pt states she had some diarrhea last night after drinking boost but otherwise overall feeling well  Pt with elevated glucose with last labwork - will recheck cmp and hgb a1c Current Outpatient Medications on File Prior to Visit  Medication Sig Dispense Refill   acetaminophen (TYLENOL) 650 MG CR tablet Take 650 mg by mouth 2 (two) times daily.     albuterol (PROVENTIL) (2.5 MG/3ML) 0.083% nebulizer solution USE 1 VIAL IN NEBULIZER EVERY 6 HOURS - As Needed For Wheezing or Shortness of Breath 3 mL 11   Ascorbic Acid (VITAMIN C) 1000 MG tablet Take 1,000 mg by mouth daily.     atenolol (TENORMIN) 50 MG tablet Take 1 tablet (50 mg total) by mouth 2 (two) times daily.     atorvastatin (LIPITOR) 40 MG tablet TAKE 1 TABLET BY MOUTH DAILY 90 tablet 1   dicyclomine (BENTYL) 10 MG capsule Take 1 capsule (10 mg total) by mouth 3 (three) times daily before  meals. 90 capsule 3   fluticasone (FLONASE) 50 MCG/ACT nasal spray SHAKE LIQUID AND USE 1 SPRAY IN EACH NOSTRIL AT BEDTIME 16 g 6   GEMTESA 75 MG TABS TAKE 1 TABLET(75 MG) BY MOUTH DAILY 30 tablet 2   LORazepam (ATIVAN) 1 MG tablet TAKE 1 TABLET(1 MG) BY MOUTH TWICE DAILY AS NEEDED 60 tablet 0   losartan-hydrochlorothiazide (HYZAAR) 100-12.5 MG tablet Take 1 tablet by mouth daily. 90 tablet 0   Multiple Vitamins-Minerals (MULTIVITAMIN WOMEN 50+ PO) Take 1 tablet by mouth daily.     omeprazole (PRILOSEC) 40 MG capsule TAKE 1 CAPSULE BY MOUTH DAILY 100 capsule 1   ondansetron (ZOFRAN-ODT) 4 MG disintegrating tablet Take 4 mg by mouth every 6 (six) hours as needed.     potassium chloride (KLOR-CON M) 10 MEQ tablet Take 10 mEq by mouth 2 (two) times daily.     venlafaxine XR (EFFEXOR-XR) 150 MG 24 hr capsule Take 1 capsule (150 mg total) by mouth daily with breakfast. 90 capsule 1   zinc gluconate 50 MG tablet Take 50 mg by mouth daily.     No current facility-administered medications on file prior to visit.   Past Medical History:  Diagnosis Date   Acute bronchitis due to other specified organisms 04/25/2020   COPD (chronic obstructive pulmonary disease) (HCC) 05/26/2020  COVID-19 10/30/2019   Gastroesophageal reflux disease with esophagitis 05/26/2020   Hypertension    Major depressive disorder, single episode, mild (HCC) 05/26/2020   Mixed hyperlipidemia 05/26/2020   Moderate protein-calorie malnutrition (HCC) 05/26/2020   OSA (obstructive sleep apnea) 05/26/2020   Past Surgical History:  Procedure Laterality Date   ABDOMINAL HYSTERECTOMY     CYSTOSCOPY  01/15/2022   with bladder lesion removal, bladder papilloma   DUODENAL DIVERTICULECTOMY  04/09/1993   OVARIAN CYST REMOVAL      History reviewed. No pertinent family history. Social History   Socioeconomic History   Marital status: Widowed    Spouse name: Not on file   Number of children: 0   Years of education: 21   Highest  education level: Not on file  Occupational History   Occupation: Homemaker  Tobacco Use   Smoking status: Never   Smokeless tobacco: Never  Vaping Use   Vaping status: Never Used  Substance and Sexual Activity   Alcohol use: Never   Drug use: Never   Sexual activity: Not Currently  Other Topics Concern   Not on file  Social History Narrative   [employer]  44 years   Social Drivers of Corporate investment banker Strain: Low Risk  (09/21/2023)   Overall Financial Resource Strain (CARDIA)    Difficulty of Paying Living Expenses: Not hard at all  Food Insecurity: No Food Insecurity (11/23/2023)   Received from [facility]   Hunger Vital Sign    Worried About Running Out of Food in the Last Year: Never true    Ran Out of Food in the Last Year: Never true  Transportation Needs: No Transportation Needs (11/23/2023)   Received from [facility] - Transportation    Lack of Transportation (Medical): No    Lack of Transportation (Non-Medical): No  Physical Activity: Inactive (09/21/2023)   Exercise Vital Sign    Days of Exercise per Week: 0 days    Minutes of Exercise per Session: 0 min  Stress: No Stress Concern Present (11/23/2023)   Received from [facility] of Occupational Health - Occupational Stress Questionnaire    Feeling of Stress : Not at all  Social Connections: Moderately Integrated (09/21/2023)   Social Connection and Isolation Panel [NHANES]    Frequency of Communication with Friends and Family: More than three times a week    Frequency of Social Gatherings with Friends and Family: More than three times a week    Attends Religious Services: More than 4 times per year    Active Member of [employer] or Organizations: Yes    Attends Banker Meetings: More than 4 times per year    Marital Status: Widowed   CONSTITUTIONAL: Negative for chills, fatigue, fever, unintentional weight gain and unintentional weight loss.  E/N/T: Negative  for ear pain, nasal congestion and sore throat.  CARDIOVASCULAR: Negative for chest pain, dizziness, palpitations and pedal edema.  RESPIRATORY: Negative for recent cough and dyspnea.  GASTROINTESTINAL: see HPI MSK: Negative for arthralgias and myalgias.  INTEGUMENTARY: Negative for rash.  NEUROLOGICAL: Negative for dizziness and headaches.  PSYCHIATRIC: Negative for sleep disturbance and to question depression screen.  Negative for depression, negative for anhedonia.       Objective:  PHYSICAL EXAM:   VS: BP 102/64 (BP Location: Left Arm, Patient Position: Sitting, Cuff Size: Normal)   Pulse 72   Temp (!) 97.5 F (36.4 C) (Temporal)   Resp 18  Ht 5' (1.524 m)   Wt 107 lb (48.5 kg)   SpO2 93%   BMI 20.90 kg/m   GEN: Well nourished, well developed, in no acute distress   Cardiac: RRR; no murmurs, rubs, or gallops,no edema -  Respiratory:  normal respiratory rate and pattern with no distress - normal breath sounds with no rales, rhonchi, wheezes or rubs GI: normal bowel sounds, no masses or tenderness MS: no deformity or atrophy  Skin: warm and dry, no rash  Neuro:  Alert and Oriented x 3, - CN II-Xii grossly intact Psych: euthymic mood, appropriate affect and demeanor      01/10/2024    9:46 AM 11/28/2023    2:14 PM 09/21/2023   11:29 AM 09/08/2023   10:33 AM 04/06/2023    9:02 AM  Depression screen PHQ 2/9  Decreased Interest 1 1 1 3 1   Down, Depressed, Hopeless 2 1 1 3 1   PHQ - 2 Score 3 2 2 6 2   Altered sleeping 0 0 0 0 0  Tired, decreased energy 2 1 2 2 1   Change in appetite 2 0 0 2 0  Feeling bad or failure about yourself  2 0 0 2 1  Trouble concentrating 1 0 1 0 0  Moving slowly or fidgety/restless 0 0 0 0 0  Suicidal thoughts 0 0 0 0 0  PHQ-9 Score 10 3 5 12 4   Difficult doing work/chores Somewhat difficult Not difficult at all Not difficult at all Somewhat difficult Somewhat difficult    Lab Results  Component Value Date   WBC 11.4 (H) 12/12/2023   HGB  11.6 12/12/2023   HCT 35.5 12/12/2023   PLT 528 (H) 12/12/2023   GLUCOSE 123 (H) 12/12/2023   CHOL 183 09/08/2023   TRIG 74 09/08/2023   HDL 59 09/08/2023   LDLCALC 110 (H) 09/08/2023   ALT 17 12/12/2023   AST 21 12/12/2023   NA 142 12/12/2023   K 5.2 12/12/2023   CL 103 12/12/2023   CREATININE 0.74 12/12/2023   BUN 6 (L) 12/12/2023   CO2 24 12/12/2023   TSH 3.360 04/06/2023      Assessment & Plan:   Problem List Items Addressed This Visit       Cardiovascular and Mediastinum   Essential hypertension - Primary   Relevant Orders   CBC with Differential/Platelet   Comprehensive metabolic panel   Lipid panel Continue current meds     Other   Mixed hyperlipidemia   Relevant Orders   Lipid panel Continue meds Watch diet   Vitamin D deficiency   Relevant Orders   VITAMIN D 25 Hydroxy (Vit-D Deficiency, Fractures)    Depression with anxiety   Relevant Medications   Continue current meds   Other Relevant Orders   TSH  COPD Continue nebulizer as needed and oxygen at night Other Visit Diagnoses     Dilated pancreatic duct Proceed with MRI   Follow up with GI as scheduled      Hyperglycemia Labwork pending     GERD Continue omeprazole     .  No orders of the defined types were placed in this encounter.    Orders Placed This Encounter  Procedures   CBC with Differential/Platelet   Comprehensive metabolic panel   TSH   Lipid panel   Hemoglobin A1c   VITAMIN D 25 Hydroxy (Vit-D Deficiency, Fractures)     Follow-up: Return in about 3 months (around 04/08/2024) for chronic fasting follow-up.  An After  Visit Summary was printed and given to the patient.  Jettie Pagan [facility] [phone]

## 2024-01-11 ENCOUNTER — Other Ambulatory Visit: Payer: Self-pay | Admitting: Physician Assistant

## 2024-01-11 DIAGNOSIS — R899 Unspecified abnormal finding in specimens from other organs, systems and tissues: Secondary | ICD-10-CM

## 2024-01-11 LAB — CBC WITH DIFFERENTIAL/PLATELET
Basophils Absolute: 0.1 10*3/uL (ref 0.0–0.2)
Basos: 0 %
EOS (ABSOLUTE): 0.2 10*3/uL (ref 0.0–0.4)
Eos: 1 %
Hematocrit: 38.3 % (ref 34.0–46.6)
Hemoglobin: 12.6 g/dL (ref 11.1–15.9)
Immature Grans (Abs): 0 10*3/uL (ref 0.0–0.1)
Immature Granulocytes: 0 %
Lymphocytes Absolute: 1.3 10*3/uL (ref 0.7–3.1)
Lymphs: 11 %
MCH: 33.1 pg — ABNORMAL HIGH (ref 26.6–33.0)
MCHC: 32.9 g/dL (ref 31.5–35.7)
MCV: 101 fL — ABNORMAL HIGH (ref 79–97)
Monocytes Absolute: 0.6 10*3/uL (ref 0.1–0.9)
Monocytes: 6 %
Neutrophils Absolute: 9.5 10*3/uL — ABNORMAL HIGH (ref 1.4–7.0)
Neutrophils: 82 %
Platelets: 297 10*3/uL (ref 150–450)
RBC: 3.81 x10E6/uL (ref 3.77–5.28)
RDW: 13.1 % (ref 11.7–15.4)
WBC: 11.6 10*3/uL — ABNORMAL HIGH (ref 3.4–10.8)

## 2024-01-11 LAB — COMPREHENSIVE METABOLIC PANEL WITH GFR
ALT: 15 IU/L (ref 0–32)
AST: 19 IU/L (ref 0–40)
Albumin: 4.3 g/dL (ref 3.7–4.7)
Alkaline Phosphatase: 99 IU/L (ref 44–121)
BUN/Creatinine Ratio: 22 (ref 12–28)
BUN: 17 mg/dL (ref 8–27)
Bilirubin Total: 0.6 mg/dL (ref 0.0–1.2)
CO2: 26 mmol/L (ref 20–29)
Calcium: 9.9 mg/dL (ref 8.7–10.3)
Chloride: 99 mmol/L (ref 96–106)
Creatinine, Ser: 0.77 mg/dL (ref 0.57–1.00)
Globulin, Total: 2.2 g/dL (ref 1.5–4.5)
Glucose: 100 mg/dL — ABNORMAL HIGH (ref 70–99)
Potassium: 4.6 mmol/L (ref 3.5–5.2)
Sodium: 141 mmol/L (ref 134–144)
Total Protein: 6.5 g/dL (ref 6.0–8.5)
eGFR: 74 mL/min/1.73

## 2024-01-11 LAB — LIPID PANEL
Chol/HDL Ratio: 3.3 ratio (ref 0.0–4.4)
Cholesterol, Total: 159 mg/dL (ref 100–199)
HDL: 48 mg/dL (ref >39)
LDL Chol Calc (NIH): 94 mg/dL (ref 0–99)
Triglycerides: 93 mg/dL (ref 0–149)
VLDL Cholesterol Cal: 17 mg/dL (ref 5–40)

## 2024-01-11 LAB — HEMOGLOBIN A1C
Est. average glucose Bld gHb Est-mCnc: 114 mg/dL
Hgb A1c MFr Bld: 5.6 % (ref 4.8–5.6)

## 2024-01-11 LAB — VITAMIN D 25 HYDROXY (VIT D DEFICIENCY, FRACTURES): Vit D, 25-Hydroxy: 100 ng/mL (ref 30.0–100.0)

## 2024-01-11 LAB — TSH: TSH: 4.66 u[IU]/mL — ABNORMAL HIGH (ref 0.450–4.500)

## 2024-01-12 ENCOUNTER — Ambulatory Visit: Payer: Self-pay | Admitting: Physician Assistant

## 2024-01-12 LAB — LAB REPORT - SCANNED
Calcium: 9
EGFR: 86

## 2024-01-12 NOTE — Telephone Encounter (Signed)
 Message from Seeley C sent at 01/12/2024 10:28 AM EST  Copied From CRM [id number]. Reason for Triage: Patient states she's been having really bad diarrhea and stomach cramps for a week. She is unable to eat is also feeling nauseous .   Chief Complaint: hx Cdiff, diarrhea and abd cramping Symptoms: severe pain to abd,diarrhea, vomiting x1, very weak Frequency: constant Pertinent Negatives: Patient denies fever, sob Disposition: [x] ED /[] Urgent Care (no appt availability in office) / [] Appointment(In office/virtual)/ []  [place] [facility]/ [] Home Care/ [] Refused Recommended Disposition /[]  Mobile Bus/ []  Follow-up with PCP Additional Notes: per protocol instructed to go to the ER.    Reason for Disposition  [1] SEVERE abdominal pain (e.g., excruciating) AND [2] present > 1 hour  Answer Assessment - Initial Assessment Questions 1. DIARRHEA SEVERITY: "How bad is the diarrhea?" "How many more stools have you had in the past 24 hours than normal?"    - NO DIARRHEA (SCALE 0)   - MILD (SCALE 1-3): Few loose or mushy BMs; increase of 1-3 stools over normal daily number of stools; mild increase in ostomy output.   -  MODERATE (SCALE 4-7): Increase of 4-6 stools daily over normal; moderate increase in ostomy output.   -  SEVERE (SCALE 8-10; OR "WORST POSSIBLE"): Increase of 7 or more stools daily over normal; moderate increase in ostomy output; incontinence.     Too many to count, 5-6 times 2. ONSET: "When did the diarrhea begin?"      Monday 3. BM CONSISTENCY: "How loose or watery is the diarrhea?"      Loose and watery 4. VOMITING: "Are you also vomiting?" If Yes, ask: "How many times in the past 24 hours?"      Nausea and vomited x 1 5. ABDOMEN PAIN: "Are you having any abdomen pain?" If Yes, ask: "What does it feel like?" (e.g., crampy, dull, intermittent, constant)      cramps 6. ABDOMEN PAIN SEVERITY: If present, ask: "How bad is the pain?"  (e.g., Scale 1-10; mild, moderate,  or severe)   - MILD (1-3): doesn't interfere with normal activities, abdomen soft and not tender to touch    - MODERATE (4-7): interferes with normal activities or awakens from sleep, abdomen tender to touch    - SEVERE (8-10): excruciating pain, doubled over, unable to do any normal activities       8-9/10 7. ORAL INTAKE: If vomiting, "Have you been able to drink liquids?" "How much liquids have you had in the past 24 hours?"     yes 8. HYDRATION: "Any signs of dehydration?" (e.g., dry mouth [not just dry lips], too weak to stand, dizziness, new weight loss) "When did you last urinate?"     Very weak 9. EXPOSURE: "Have you traveled to a foreign country recently?" "Have you been exposed to anyone with diarrhea?" "Could you have eaten any food that was spoiled?"     na 10. ANTIBIOTIC USE: "Are you taking antibiotics now or have you taken antibiotics in the past 2 months?"       Yes, when was in hospital 11. OTHER SYMPTOMS: "Do you have any other symptoms?" (e.g., fever, blood in stool)       denies 12. PREGNANCY: "Is there any chance you are pregnant?" "When was your last menstrual period?"       na  Protocols used: [facility]

## 2024-01-19 ENCOUNTER — Ambulatory Visit: Admitting: Family Medicine

## 2024-01-19 ENCOUNTER — Inpatient Hospital Stay: Payer: Medicare Other | Admitting: Physician Assistant

## 2024-01-19 ENCOUNTER — Encounter: Payer: Self-pay | Admitting: Family Medicine

## 2024-01-19 VITALS — BP 138/66 | HR 71 | Temp 97.8°F | Resp 16 | Ht 60.0 in | Wt 107.4 lb

## 2024-01-19 DIAGNOSIS — A0472 Enterocolitis due to Clostridium difficile, not specified as recurrent: Secondary | ICD-10-CM

## 2024-01-19 DIAGNOSIS — K8689 Other specified diseases of pancreas: Secondary | ICD-10-CM

## 2024-01-19 NOTE — Progress Notes (Signed)
 Subjective:  Patient ID: Brenda Patrick, female    DOB: 04/21/1932  Age: 89 y.o. MRN: [id number]  Chief Complaint  Patient presents with   Hospitalization Follow-up    Discussed the use of AI scribe software for clinical note transcription with the patient, who gave verbal consent to proceed.  HPI Brenda Patrick is an 89 year old female who presents for follow-up after recent hospitalization on 01/12/24 for C. diff infection.  She has been hospitalized twice due to Clostridioides difficile infections and sent home on a 10 day course of Vancomycin. Currently, she has one formed stool today and yesterday, indicating improvement. No painful urination, frequency, urgency, or fever.  She is adhering to a diet of plain baked chicken and fluids, avoiding hard solids.   No dizziness or lightheadedness at present, although she was slightly dehydrated during her recent emergency room visit. She mentions a previous experience with dizziness attributed to Bentyl, which was resolved by halving the dose as instructed by Dr. Erich Montane.  She is scheduled for a magnetic resonance cholangiopancreatography tomorrow for dilation of the common bile duct measuring 10 mm.  She has a regular follow-up schedule with her primary care provider every six months, with the last visit on February 25th.     01/10/2024    9:46 AM 11/28/2023    2:14 PM 09/21/2023   11:29 AM 09/08/2023   10:33 AM 04/06/2023    9:02 AM  Depression screen PHQ 2/9  Decreased Interest 1 1 1 3 1   Down, Depressed, Hopeless 2 1 1 3 1   PHQ - 2 Score 3 2 2 6 2   Altered sleeping 0 0 0 0 0  Tired, decreased energy 2 1 2 2 1   Change in appetite 2 0 0 2 0  Feeling bad or failure about yourself  2 0 0 2 1  Trouble concentrating 1 0 1 0 0  Moving slowly or fidgety/restless 0 0 0 0 0  Suicidal thoughts 0 0 0 0 0  PHQ-9 Score 10 3 5 12 4   Difficult doing work/chores Somewhat difficult Not difficult at all Not difficult at all Somewhat  difficult Somewhat difficult        01/10/2024    9:46 AM  Fall Risk   Falls in the past year? 0  Number falls in past yr: 0  Injury with Fall? 0  Risk for fall due to : No Fall Risks  Follow up Falls evaluation completed    Patient Care Team: Marianne Sofia, Cordelia Poche as PCP - General (Physician Assistant)   Review of Systems  Constitutional:  Negative for appetite change, fatigue and fever.  HENT:  Negative for congestion, ear pain, sinus pressure and sore throat.   Eyes: Negative.   Respiratory:  Negative for cough, chest tightness, shortness of breath and wheezing.   Cardiovascular:  Negative for chest pain and palpitations.  Gastrointestinal:  Negative for abdominal pain, constipation, diarrhea, nausea and vomiting.  Endocrine: Negative.   Genitourinary:  Negative for dysuria, hematuria and urgency.  Musculoskeletal:  Negative for arthralgias, back pain, joint swelling and myalgias.  Skin:  Negative for rash.  Allergic/Immunologic: Negative.   Neurological:  Negative for dizziness, weakness and headaches.  Hematological: Negative.   Psychiatric/Behavioral:  Negative for dysphoric mood. The patient is not nervous/anxious.     Current Outpatient Medications on File Prior to Visit  Medication Sig Dispense Refill   acetaminophen (TYLENOL) 650 MG CR tablet Take 650 mg by mouth 2 (two) times  daily.     albuterol (PROVENTIL) (2.5 MG/3ML) 0.083% nebulizer solution USE 1 VIAL IN NEBULIZER EVERY 6 HOURS - As Needed For Wheezing or Shortness of Breath 3 mL 11   Ascorbic Acid (VITAMIN C) 1000 MG tablet Take 1,000 mg by mouth daily.     atenolol (TENORMIN) 50 MG tablet Take 1 tablet (50 mg total) by mouth 2 (two) times daily.     atorvastatin (LIPITOR) 40 MG tablet TAKE 1 TABLET BY MOUTH DAILY 90 tablet 1   dicyclomine (BENTYL) 10 MG capsule Take 1 capsule (10 mg total) by mouth 3 (three) times daily before meals. 90 capsule 3   fluticasone (FLONASE) 50 MCG/ACT nasal spray SHAKE LIQUID AND  USE 1 SPRAY IN EACH NOSTRIL AT BEDTIME 16 g 6   GEMTESA 75 MG TABS TAKE 1 TABLET(75 MG) BY MOUTH DAILY 30 tablet 2   LORazepam (ATIVAN) 1 MG tablet TAKE 1 TABLET(1 MG) BY MOUTH TWICE DAILY AS NEEDED 60 tablet 0   losartan-hydrochlorothiazide (HYZAAR) 100-12.5 MG tablet Take 1 tablet by mouth daily. 90 tablet 0   Multiple Vitamins-Minerals (MULTIVITAMIN WOMEN 50+ PO) Take 1 tablet by mouth daily.     omeprazole (PRILOSEC) 40 MG capsule TAKE 1 CAPSULE BY MOUTH DAILY 100 capsule 1   ondansetron (ZOFRAN-ODT) 4 MG disintegrating tablet Take 4 mg by mouth every 6 (six) hours as needed.     potassium chloride (KLOR-CON M) 10 MEQ tablet Take 10 mEq by mouth 2 (two) times daily.     vancomycin (VANCOCIN) 125 MG capsule Take 125 mg by mouth every 6 (six) hours.     venlafaxine XR (EFFEXOR-XR) 150 MG 24 hr capsule Take 1 capsule (150 mg total) by mouth daily with breakfast. 90 capsule 1   zinc gluconate 50 MG tablet Take 50 mg by mouth daily.     No current facility-administered medications on file prior to visit.   Past Medical History:  Diagnosis Date   Acute bronchitis due to other specified organisms 04/25/2020   COPD (chronic obstructive pulmonary disease) (HCC) 05/26/2020   COVID-19 10/30/2019   Gastroesophageal reflux disease with esophagitis 05/26/2020   Hypertension    Major depressive disorder, single episode, mild (HCC) 05/26/2020   Mixed hyperlipidemia 05/26/2020   Moderate protein-calorie malnutrition (HCC) 05/26/2020   OSA (obstructive sleep apnea) 05/26/2020   Past Surgical History:  Procedure Laterality Date   ABDOMINAL HYSTERECTOMY     CYSTOSCOPY  01/15/2022   with bladder lesion removal, bladder papilloma   DUODENAL DIVERTICULECTOMY  04/09/1993   OVARIAN CYST REMOVAL      History reviewed. No pertinent family history. 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   Social Drivers of Health   Financial Resource Strain: Low Risk  (09/21/2023)   Overall Financial Resource Strain (CARDIA)    Difficulty of Paying Living Expenses: Not hard at all  Food Insecurity: No Food Insecurity (11/23/2023)  Received from [facility] Vital Sign    Worried About Running Out of Food in the Last Year: Never true    Ran Out of Food in the Last Year: Never true  Transportation Needs: No Transportation Needs (11/23/2023)   Received from [facility] - Transportation    Lack of Transportation (Medical): No    Lack of Transportation (Non-Medical): No  Physical Activity: Inactive (09/21/2023)   Exercise Vital Sign    Days of Exercise per Week: 0 days    Minutes of Exercise per Session: 0 min  Stress: No Stress Concern Present (11/23/2023)   Received from [facility] of Occupational Health - Occupational Stress Questionnaire    Feeling of Stress : Not at all  Social Connections: Moderately Integrated (09/21/2023)   Social Connection and Isolation Panel [NHANES]    Frequency of Communication with Friends and Family: More than three times a week    Frequency of Social Gatherings with Friends and Family: More than three times a week    Attends Religious Services: More than 4 times per year    Active Member of [employer] or Organizations: Yes    Attends Banker Meetings: More than 4 times per year    Marital Status: Widowed    Objective:  BP 138/66   Pulse 71   Temp 97.8 F (36.6 C) (Oral)   Resp 16   Ht 5' (1.524 m)   Wt 107 lb 6.4 oz (48.7 kg)    SpO2 99%   BMI 20.98 kg/m      01/19/2024    3:10 PM 01/10/2024    9:42 AM 12/12/2023    8:46 AM  BP/Weight  Systolic BP 138 102 118  Diastolic BP 66 64 68  Wt. (Lbs) 107.4 107 114.4  BMI 20.98 kg/m2 20.9 kg/m2 22.34 kg/m2    Physical Exam Vitals reviewed.  Constitutional:      General: She is not in acute distress.    Appearance: Normal appearance.  Eyes:     Conjunctiva/sclera: Conjunctivae normal.  Cardiovascular:     Rate and Rhythm: Normal rate and regular rhythm.     Heart sounds: Normal heart sounds. No murmur heard. Pulmonary:     Effort: Pulmonary effort is normal.     Breath sounds: Normal breath sounds. No wheezing.  Abdominal:     General: Bowel sounds are normal.     Palpations: Abdomen is soft.     Tenderness: There is no abdominal tenderness.  Skin:    General: Skin is warm.     Findings: Bruising (LAFA) present.  Neurological:     Mental Status: She is alert. Mental status is at baseline.  Psychiatric:        Mood and Affect: Mood normal.        Behavior: Behavior normal.     Diabetic Foot Exam - Simple   No data filed      Lab Results  Component Value Date   WBC 11.6 (H) 01/10/2024   HGB 12.6 01/10/2024   HCT 38.3 01/10/2024   PLT 297 01/10/2024   GLUCOSE 100 (H) 01/10/2024   CHOL 159 01/10/2024   TRIG 93 01/10/2024   HDL 48 01/10/2024   LDLCALC 94 01/10/2024   ALT 15 01/10/2024   AST 19 01/10/2024   NA 141 01/10/2024   K 4.6 01/10/2024   CL 99 01/10/2024   CREATININE 0.77 01/10/2024   BUN 17 01/10/2024  CO2 26 01/10/2024   TSH 4.660 (H) 01/10/2024   HGBA1C 5.6 01/10/2024     Assessment & Plan:   C. difficile diarrhea Assessment & Plan: Improved Recurrent C. diff infections. Currently improved with formed stools. No UTI or fever. Blood pressure controlled. Finishing up the Vancomycin 10 day course - Continue recommended diet, avoid hard solids and seasoning. - Maintain adequate fluid intake. - Monitor for recurrence  signs and report if she occurs.   Pancreatic duct dilated Assessment & Plan: CT scan showed 10 mm dilated pancreatic duct. - MRCP scheduled for tomorrow.       No orders of the defined types were placed in this encounter.   No orders of the defined types were placed in this encounter.    Follow-up: Return in about 5 months (around 06/20/2024) for chronic- Gerre Pebbles, PA.   An After Visit Summary was printed and given to the patient.  Total time spent on today's visit was 26 minutes, including both face-to-face time and nonface-to-face time personally spent on review of chart (labs and imaging), discussing labs and goals, discussing further work-up, treatment options, referrals to specialist if needed, reviewing outside records if pertinent, answering patient's questions, and coordinating care.    Lajuana Matte, FNP [facility] [phone]

## 2024-01-19 NOTE — Assessment & Plan Note (Signed)
 Improved Recurrent C. diff infections. Currently improved with formed stools. No UTI or fever. Blood pressure controlled. Finishing up the Vancomycin 10 day course - Continue recommended diet, avoid hard solids and seasoning. - Maintain adequate fluid intake. - Monitor for recurrence signs and report if she occurs.

## 2024-01-19 NOTE — Assessment & Plan Note (Signed)
 CT scan showed 10 mm dilated pancreatic duct. - MRCP scheduled for tomorrow.

## 2024-01-20 ENCOUNTER — Ambulatory Visit
Admission: RE | Admit: 2024-01-20 | Discharge: 2024-01-20 | Disposition: A | Discharge status: Home / Self Care | Payer: Medicare Other | Source: Ambulatory Visit

## 2024-01-20 DIAGNOSIS — R935 Abnormal findings on diagnostic imaging of other abdominal regions, including retroperitoneum: Secondary | ICD-10-CM

## 2024-01-20 MED ORDER — GADOPICLENOL 0.5 MMOL/ML IV SOLN
5.0000 mL | Freq: Once | INTRAVENOUS | Status: AC | PRN
  Administered 2024-01-20: 5 mL via INTRAVENOUS

## 2024-02-07 ENCOUNTER — Other Ambulatory Visit: Payer: Self-pay | Admitting: Physician Assistant

## 2024-02-07 DIAGNOSIS — F418 Other specified anxiety disorders: Secondary | ICD-10-CM

## 2024-02-08 ENCOUNTER — Other Ambulatory Visit: Payer: Medicare Other

## 2024-02-08 DIAGNOSIS — R899 Unspecified abnormal finding in specimens from other organs, systems and tissues: Secondary | ICD-10-CM

## 2024-02-09 LAB — TSH: TSH: 3.78 u[IU]/mL (ref 0.450–4.500)

## 2024-02-17 ENCOUNTER — Other Ambulatory Visit: Payer: Self-pay | Admitting: Physician Assistant

## 2024-03-15 ENCOUNTER — Other Ambulatory Visit: Payer: Self-pay | Admitting: Physician Assistant

## 2024-03-15 DIAGNOSIS — E782 Mixed hyperlipidemia: Secondary | ICD-10-CM

## 2024-03-24 ENCOUNTER — Other Ambulatory Visit: Payer: Self-pay | Admitting: Physician Assistant

## 2024-03-24 DIAGNOSIS — K2100 Gastro-esophageal reflux disease with esophagitis, without bleeding: Secondary | ICD-10-CM

## 2024-03-26 ENCOUNTER — Other Ambulatory Visit: Payer: Self-pay | Admitting: Physician Assistant

## 2024-03-26 DIAGNOSIS — F320 Major depressive disorder, single episode, mild: Secondary | ICD-10-CM

## 2024-04-01 ENCOUNTER — Other Ambulatory Visit: Payer: Self-pay | Admitting: Physician Assistant

## 2024-04-01 DIAGNOSIS — K580 Irritable bowel syndrome with diarrhea: Secondary | ICD-10-CM

## 2024-04-12 ENCOUNTER — Ambulatory Visit: Payer: Medicare Other | Admitting: Physician Assistant

## 2024-05-19 ENCOUNTER — Other Ambulatory Visit: Payer: Self-pay | Admitting: Physician Assistant

## 2024-06-11 ENCOUNTER — Other Ambulatory Visit: Payer: Self-pay | Admitting: Physician Assistant

## 2024-06-11 DIAGNOSIS — F418 Other specified anxiety disorders: Secondary | ICD-10-CM

## 2024-06-14 ENCOUNTER — Other Ambulatory Visit: Payer: Self-pay | Admitting: Physician Assistant

## 2024-06-14 ENCOUNTER — Other Ambulatory Visit: Payer: Self-pay

## 2024-06-14 DIAGNOSIS — I10 Essential (primary) hypertension: Secondary | ICD-10-CM

## 2024-06-14 MED ORDER — ATENOLOL 50 MG PO TABS: 50.0000 mg | ORAL_TABLET | Freq: Every day | ORAL | 1 refills | Status: DC

## 2024-06-14 NOTE — Telephone Encounter (Signed)
 Copied from CRM [id number]. Topic: Clinical - Prescription Issue >> Jun 14, 2024  2:48 PM Santiya F wrote: Reason for CRM: Patient is calling in wanting to know if her prescription for atenolol  (TENORMIN ) 50 MG tablet [[id number]] can be sent to the [facility] on [address] because [facility] said it would take 3 weeks to get to her and she only has 3 pills left. Please advise.

## 2024-06-21 ENCOUNTER — Encounter: Payer: Self-pay | Admitting: Physician Assistant

## 2024-06-21 ENCOUNTER — Ambulatory Visit (INDEPENDENT_AMBULATORY_CARE_PROVIDER_SITE_OTHER): Admitting: Physician Assistant

## 2024-06-21 VITALS — BP 102/60 | HR 74 | Temp 98.3°F | Ht 60.0 in | Wt 105.2 lb

## 2024-06-21 DIAGNOSIS — K580 Irritable bowel syndrome with diarrhea: Secondary | ICD-10-CM

## 2024-06-21 DIAGNOSIS — I10 Essential (primary) hypertension: Secondary | ICD-10-CM | POA: Diagnosis not present

## 2024-06-21 DIAGNOSIS — F418 Other specified anxiety disorders: Secondary | ICD-10-CM

## 2024-06-21 DIAGNOSIS — R739 Hyperglycemia, unspecified: Secondary | ICD-10-CM

## 2024-06-21 DIAGNOSIS — E782 Mixed hyperlipidemia: Secondary | ICD-10-CM | POA: Diagnosis not present

## 2024-06-21 DIAGNOSIS — K2100 Gastro-esophageal reflux disease with esophagitis, without bleeding: Secondary | ICD-10-CM

## 2024-06-21 DIAGNOSIS — E559 Vitamin D deficiency, unspecified: Secondary | ICD-10-CM

## 2024-06-21 DIAGNOSIS — J449 Chronic obstructive pulmonary disease, unspecified: Secondary | ICD-10-CM

## 2024-06-21 NOTE — Progress Notes (Signed)
Subjective:  Patient ID: Brenda Patrick, female    DOB: 07-12-1932  Age: 89 y.o. MRN: [id number]  Chief Complaint  Patient presents with   Medical Management of Chronic Issues      Pt with history of hypertension - She is currently on tenormin  50mg  qd and hyzaar 100/12.5mg   She denies chest pain/sob/edema/palpitations  Pt with history of hyperlipidemia - she is currently on lipitor 40mg  qd  -she is due for labwork States she does try to watch her diet  Pt with history of depression with anxiety - pt is currently taking effexor  150 mg,and lorazepam  as needed.    Pt with history of GERD - stable on prilosec 40mg  qd  Pt with history of stress incontinence - pt currently has a stock of Gemtesa  to finish but is too expensive and when that runs out would like to try another medication   Pt with COPD - she uses a nebulizer prn and uses oxygen  at night - states several years ago had low readings at night with oxygen  levels and started using -   Pt has recovered from having Cdiff and is not having any trouble at this time with her IBS - does use bentyl  as directed Pt follows with Dr Larene  Pt with elevated glucose with last labwork - will recheck cmp and hgb a1c Current Outpatient Medications on File Prior to Visit  Medication Sig Dispense Refill   acetaminophen  (TYLENOL ) 650 MG CR tablet Take 650 mg by mouth 2 (two) times daily.     albuterol  (PROVENTIL ) (2.5 MG/3ML) 0.083% nebulizer solution USE 1 VIAL IN NEBULIZER EVERY 6 HOURS - As Needed For Wheezing or Shortness of Breath 3 mL 11   Ascorbic Acid (VITAMIN C) 1000 MG tablet Take 1,000 mg by mouth daily.     atenolol  (TENORMIN ) 50 MG tablet Take 1 tablet (50 mg total) by mouth daily. 100 tablet 1   atorvastatin  (LIPITOR) 40 MG tablet TAKE 1 TABLET BY MOUTH DAILY 100 tablet 1   dicyclomine  (BENTYL ) 10 MG capsule TAKE 1 CAPSULE(10 MG) BY MOUTH THREE TIMES DAILY BEFORE MEALS 90 capsule 3   fluticasone  (FLONASE ) 50 MCG/ACT nasal  spray SHAKE LIQUID AND USE 1 SPRAY IN EACH NOSTRIL AT BEDTIME 16 g 6   LORazepam  (ATIVAN ) 1 MG tablet TAKE 1 TABLET(1 MG) BY MOUTH TWICE DAILY AS NEEDED 60 tablet 0   losartan -hydrochlorothiazide  (HYZAAR) 100-12.5 MG tablet TAKE 1 TABLET BY MOUTH DAILY 90 tablet 0   Multiple Vitamins-Minerals (MULTIVITAMIN WOMEN 50+ PO) Take 1 tablet by mouth daily.     omeprazole  (PRILOSEC) 40 MG capsule TAKE 1 CAPSULE BY MOUTH DAILY 100 capsule 1   ondansetron  (ZOFRAN -ODT) 4 MG disintegrating tablet Take 4 mg by mouth every 6 (six) hours as needed.     potassium chloride  (KLOR-CON  M) 10 MEQ tablet Take 10 mEq by mouth 2 (two) times daily.     venlafaxine  XR (EFFEXOR -XR) 75 MG 24 hr capsule Take 75 mg by mouth daily.     zinc gluconate 50 MG tablet Take 50 mg by mouth daily.     No current facility-administered medications on file prior to visit.   Past Medical History:  Diagnosis Date   Acute bronchitis due to other specified organisms 04/25/2020   COPD (chronic obstructive pulmonary disease) (HCC) 05/26/2020   COVID-19 10/30/2019   Gastroesophageal reflux disease with esophagitis 05/26/2020   Hypertension    Major depressive disorder, single episode, mild (HCC) 05/26/2020   Mixed hyperlipidemia 05/26/2020  Moderate protein-calorie malnutrition (HCC) 05/26/2020   OSA (obstructive sleep apnea) 05/26/2020   Past Surgical History:  Procedure Laterality Date   ABDOMINAL HYSTERECTOMY     CYSTOSCOPY  01/15/2022   with bladder lesion removal, bladder papilloma   DUODENAL DIVERTICULECTOMY  04/09/1993   OVARIAN CYST REMOVAL      History reviewed. No pertinent family history. Social History   Socioeconomic History   Marital status: Widowed    Spouse name: Not on file   Number of children: 0   Years of education: 110   Highest education level: Not on file  Occupational History   Occupation: Homemaker  Tobacco Use   Smoking status: Never   Smokeless tobacco: Never  Vaping Use   Vaping status: Never Used   Substance and Sexual Activity   Alcohol use: Never   Drug use: Never   Sexual activity: Not Currently  Other Topics Concern   Not on file  Social History Narrative   [employer]  44 years   Social Drivers of Corporate investment banker Strain: Low Risk  (09/21/2023)   Overall Financial Resource Strain (CARDIA)    Difficulty of Paying Living Expenses: Not hard at all  Food Insecurity: No Food Insecurity (11/23/2023)   Received from [facility]   Hunger Vital Sign    Within the past 12 months, you worried that your food would run out before you got the money to buy more.: Never true    Within the past 12 months, the food you bought just didn't last and you didn't have money to get more.: Never true  Transportation Needs: No Transportation Needs (11/23/2023)   Received from [facility] - Transportation    Lack of Transportation (Medical): No    Lack of Transportation (Non-Medical): No  Physical Activity: Inactive (09/21/2023)   Exercise Vital Sign    Days of Exercise per Week: 0 days    Minutes of Exercise per Session: 0 min  Stress: No Stress Concern Present (11/23/2023)   Received from [facility] - Occupational Stress Questionnaire    Feeling of Stress : Not at all  Social Connections: Moderately Integrated (09/21/2023)   Social Connection and Isolation Panel    Frequency of Communication with Friends and Family: More than three times a week    Frequency of Social Gatherings with Friends and Family: More than three times a week    Attends Religious Services: More than 4 times per year    Active Member of [employer] or Organizations: Yes    Attends Banker Meetings: More than 4 times per year    Marital Status: Widowed   CONSTITUTIONAL: Negative for chills, fatigue, fever, unintentional weight gain and unintentional weight loss.  E/N/T: Negative for ear pain, nasal congestion and sore throat.  CARDIOVASCULAR:  Negative for chest pain, dizziness, palpitations and pedal edema.  RESPIRATORY: Negative for recent cough and dyspnea.  GASTROINTESTINAL: Negative for abdominal pain, acid reflux symptoms, constipation, diarrhea, nausea and vomiting.  MSK: Negative for arthralgias and myalgias.  INTEGUMENTARY: Negative for rash.  NEUROLOGICAL: Negative for dizziness and headaches.  PSYCHIATRIC: Negative for sleep disturbance and to question depression screen.  Negative for depression, negative for anhedonia.       Objective:  PHYSICAL EXAM:   VS: BP 102/60   Pulse 74   Temp 98.3 F (36.8 C)   Ht 5' (1.524 m)   Wt 105 lb 3.2 oz (47.7 kg)  SpO2 95%   BMI 20.55 kg/m   GEN: Well nourished, well developed, in no acute distress   Cardiac: RRR; no murmurs, rubs, or gallops,no edema -  Respiratory:  normal respiratory rate and pattern with no distress - normal breath sounds with no rales, rhonchi, wheezes or rubs  MS: no deformity or atrophy  Skin: warm and dry, no rash  Neuro:  Alert and Oriented x 3,  - CN II-Xii grossly intact Psych: euthymic mood, appropriate affect and demeanor      06/21/2024    9:19 AM 01/10/2024    9:46 AM 11/28/2023    2:14 PM 09/21/2023   11:29 AM 09/08/2023   10:33 AM  Depression screen PHQ 2/9  Decreased Interest 1 1 1 1 3   Down, Depressed, Hopeless 1 2 1 1 3   PHQ - 2 Score 2 3 2 2 6   Altered sleeping 0 0 0 0 0  Tired, decreased energy 2 2 1 2 2   Change in appetite 0 2 0 0 2  Feeling bad or failure about yourself  2 2 0 0 2  Trouble concentrating 0 1 0 1 0  Moving slowly or fidgety/restless 0 0 0 0 0  Suicidal thoughts 0 0 0 0 0  PHQ-9 Score 6 10 3 5 12   Difficult doing work/chores Not difficult at all Somewhat difficult Not difficult at all Not difficult at all Somewhat difficult    Lab Results  Component Value Date   WBC 11.6 (H) 01/10/2024   HGB 12.6 01/10/2024   HCT 38.3 01/10/2024   PLT 297 01/10/2024   GLUCOSE 100 (H) 01/10/2024   CHOL 159  01/10/2024   TRIG 93 01/10/2024   HDL 48 01/10/2024   LDLCALC 94 01/10/2024   ALT 15 01/10/2024   AST 19 01/10/2024   NA 141 01/10/2024   K 4.6 01/10/2024   CL 99 01/10/2024   CREATININE 0.77 01/10/2024   BUN 17 01/10/2024   CO2 26 01/10/2024   TSH 3.780 02/08/2024   HGBA1C 5.6 01/10/2024      Assessment & Plan:   Problem List Items Addressed This Visit       Cardiovascular and Mediastinum   Essential hypertension - Primary   Relevant Orders   CBC with Differential/Platelet   Comprehensive metabolic panel   Lipid panel Continue current meds     Other   Mixed hyperlipidemia   Relevant Orders   Lipid panel Continue meds Watch diet   Vitamin D  deficiency   Relevant Orders   VITAMIN D  25 Hydroxy (Vit-D Deficiency, Fractures)    Depression with anxiety   Relevant Medications   Continue current meds   Other Relevant Orders   TSH  COPD Continue nebulizer as needed and oxygen  at night Other Visit Diagnoses     IBS with diarrhea  Continue bentyl          Hyperglycemia Labwork pending     GERD Continue omeprazole      .  No orders of the defined types were placed in this encounter.    Orders Placed This Encounter  Procedures   CBC with Differential/Platelet   Comprehensive metabolic panel with GFR   TSH   Lipid panel   Hemoglobin A1c   VITAMIN D  25 Hydroxy (Vit-D Deficiency, Fractures)     Follow-up: Return in about 6 months (around 12/22/2024) for chronic fasting follow-up.  An After Visit Summary was printed and given to the patient.  SARA R Keshan Reha, PA-C [facility] [phone])  [phone] 

## 2024-06-22 ENCOUNTER — Ambulatory Visit: Payer: Self-pay | Admitting: Physician Assistant

## 2024-06-22 LAB — COMPREHENSIVE METABOLIC PANEL WITH GFR
ALT: 15 IU/L (ref 0–32)
AST: 22 IU/L (ref 0–40)
Albumin: 4.6 g/dL (ref 3.7–4.7)
Alkaline Phosphatase: 117 IU/L (ref 44–121)
BUN/Creatinine Ratio: 38 — ABNORMAL HIGH (ref 12–28)
BUN: 30 mg/dL — ABNORMAL HIGH (ref 8–27)
Bilirubin Total: 0.5 mg/dL (ref 0.0–1.2)
CO2: 23 mmol/L (ref 20–29)
Calcium: 10.1 mg/dL (ref 8.7–10.3)
Chloride: 101 mmol/L (ref 96–106)
Creatinine, Ser: 0.79 mg/dL (ref 0.57–1.00)
Globulin, Total: 2.5 g/dL (ref 1.5–4.5)
Glucose: 95 mg/dL (ref 70–99)
Potassium: 4.9 mmol/L (ref 3.5–5.2)
Sodium: 143 mmol/L (ref 134–144)
Total Protein: 7.1 g/dL (ref 6.0–8.5)
eGFR: 71 mL/min/1.73 (ref >59)

## 2024-06-22 LAB — CBC WITH DIFFERENTIAL/PLATELET
Basophils Absolute: 0.1 x10E3/uL (ref 0.0–0.2)
Basos: 1 %
EOS (ABSOLUTE): 0.1 x10E3/uL (ref 0.0–0.4)
Eos: 2 %
Hematocrit: 38.1 % (ref 34.0–46.6)
Hemoglobin: 12.5 g/dL (ref 11.1–15.9)
Immature Grans (Abs): 0 x10E3/uL (ref 0.0–0.1)
Immature Granulocytes: 0 %
Lymphocytes Absolute: 3.1 x10E3/uL (ref 0.7–3.1)
Lymphs: 40 %
MCH: 33.5 pg — ABNORMAL HIGH (ref 26.6–33.0)
MCHC: 32.8 g/dL (ref 31.5–35.7)
MCV: 102 fL — ABNORMAL HIGH (ref 79–97)
Monocytes Absolute: 0.5 x10E3/uL (ref 0.1–0.9)
Monocytes: 6 %
Neutrophils Absolute: 4.1 x10E3/uL (ref 1.4–7.0)
Neutrophils: 51 %
Platelets: 296 x10E3/uL (ref 150–450)
RBC: 3.73 x10E6/uL — ABNORMAL LOW (ref 3.77–5.28)
RDW: 11.8 % (ref 11.7–15.4)
WBC: 7.8 x10E3/uL (ref 3.4–10.8)

## 2024-06-22 LAB — LIPID PANEL
Chol/HDL Ratio: 2.8 ratio (ref 0.0–4.4)
Cholesterol, Total: 169 mg/dL (ref 100–199)
HDL: 60 mg/dL (ref >39)
LDL Chol Calc (NIH): 96 mg/dL (ref 0–99)
Triglycerides: 68 mg/dL (ref 0–149)
VLDL Cholesterol Cal: 13 mg/dL (ref 5–40)

## 2024-06-22 LAB — VITAMIN D 25 HYDROXY (VIT D DEFICIENCY, FRACTURES): Vit D, 25-Hydroxy: 67.2 ng/mL (ref 30.0–100.0)

## 2024-06-22 LAB — HEMOGLOBIN A1C
Est. average glucose Bld gHb Est-mCnc: 114 mg/dL
Hgb A1c MFr Bld: 5.6 % (ref 4.8–5.6)

## 2024-06-22 LAB — TSH: TSH: 2.91 u[IU]/mL (ref 0.450–4.500)

## 2024-06-22 NOTE — Progress Notes (Signed)
 Also B-12 and Folate have been added.

## 2024-06-23 LAB — B12 AND FOLATE PANEL
Folate: >20 ng/mL (ref >3.0)
Vitamin B-12: 494 pg/mL (ref 232–1245)

## 2024-06-23 LAB — SPECIMEN STATUS REPORT

## 2024-06-30 ENCOUNTER — Other Ambulatory Visit: Payer: Self-pay | Admitting: Physician Assistant

## 2024-07-11 ENCOUNTER — Telehealth: Payer: Self-pay

## 2024-07-11 NOTE — Transitions of Care (Post Inpatient/ED Visit) (Signed)
 07/11/2024  Name: Brenda Patrick MRN: [id number] DOB: [birth year]  Today's TOC FU Call Status: Today's TOC FU Call Status:: Successful TOC FU Call Completed TOC FU Call Complete Date: 07/11/24 Patient's Name and Date of Birth confirmed.  Transition Care Management Follow-up Telephone Call Date of Discharge: 07/10/24 Discharge Facility: Other ([facility]) Name of [facility] ([facility]) [facility] Facility: [facility] Type of Discharge: Inpatient Admission Primary Inpatient Discharge Diagnosis:: Colitis, Cdiff How have you been since you were released from the hospital?: Better Any questions or concerns?: No  Items Reviewed: Did you receive and understand the discharge instructions provided?: Yes Medications obtained,verified, and reconciled?: Yes (Medications Reviewed) Any new allergies since your discharge?: No Dietary orders reviewed?: Yes Type of Diet Ordered:: eats chicken/turkey veget /very little fruit - avoids seeds Do you have support at home?: Yes People in Home [RPT]: child(ren), adult, friend(s) Name of Support/Comfort Primary Source: patient states nieces and her nephew help if needed , neighbor helps if needed, church family  Medications Reviewed Today: Medications Reviewed Today     Reviewed by Lauro Shona LABOR, RN (Registered Nurse) on 07/11/24 at 1359  Med List Status: <None>   Medication Order Taking? Sig Documenting Provider Last Dose Status Informant  acetaminophen  (TYLENOL ) 650 MG CR tablet [id number] Yes Take 650 mg by mouth 2 (two) times daily. [provider]  Active   albuterol  (PROVENTIL ) (2.5 MG/3ML) 0.083% nebulizer solution [id number] Yes USE 1 VIAL IN NEBULIZER EVERY 6 HOURS - As Needed For Wheezing or Shortness of Breath Nicholaus Credit, PA-C  Active   Ascorbic Acid (VITAMIN C) 1000 MG tablet [id number] Yes Take 1,000 mg by mouth daily. [provider]  Active   atenolol  (TENORMIN ) 50 MG tablet [id number] Yes Take 1 tablet (50 mg  total) by mouth daily. Nicholaus Credit, PA-C  Active   atorvastatin  (LIPITOR) 40 MG tablet [id number] Yes TAKE 1 TABLET BY MOUTH DAILY Nicholaus Credit, PA-C  Active   dicyclomine  (BENTYL ) 10 MG capsule [id number] Yes TAKE 1 CAPSULE(10 MG) BY MOUTH THREE TIMES DAILY BEFORE MEALS Nicholaus Credit, PA-C  Active   fluticasone  (FLONASE ) 50 MCG/ACT nasal spray [id number] Yes SHAKE LIQUID AND USE 1 SPRAY IN EACH NOSTRIL AT BEDTIME Nicholaus Credit, PA-C  Active   LORazepam  (ATIVAN ) 1 MG tablet [id number] Yes TAKE 1 TABLET(1 MG) BY MOUTH TWICE DAILY AS NEEDED Nicholaus Credit, PA-C  Active   losartan -hydrochlorothiazide  (HYZAAR) 100-12.5 MG tablet [id number] Yes TAKE 1 TABLET BY MOUTH DAILY Nicholaus Credit, PA-C  Active   Multiple Vitamins-Minerals (MULTIVITAMIN WOMEN 50+ PO) [id number] Yes Take 1 tablet by mouth daily. [provider]  Active Self  omeprazole  (PRILOSEC) 40 MG capsule [id number] Yes TAKE 1 CAPSULE BY MOUTH DAILY Nicholaus Credit, PA-C  Active   ondansetron  (ZOFRAN -ODT) 4 MG disintegrating tablet [id number] Yes Take 4 mg by mouth every 6 (six) hours as needed. [provider]  Active   potassium chloride  (KLOR-CON  M) 10 MEQ tablet [id number]  Take 10 mEq by mouth 2 (two) times daily.  Patient not taking: Reported on 07/11/2024   [provider]  Active   Probiotic Product [facility]) CAPS [id number] Yes Take 1 capsule by mouth 2 (two) times daily. [provider]  Active   vancomycin (VANCOCIN) 125 MG capsule [id number] Yes Take 125 mg by mouth 4 (four) times daily.  Patient taking differently: Take 125 mg by mouth 4 (four) times daily. 07/10/24 [facility] discharge lists 4x/day x 10 days, then 2x/day for 7 days, then once  daily for 7 days, then 3 days for 2 weeks   [provider]  Active   venlafaxine  XR (EFFEXOR -XR) 75 MG 24 hr capsule [id number] Yes TAKE 1 CAPSULE(75 MG) BY MOUTH DAILY WITH BREAKFAST Davis, Sara, PA-C  Active   Vibegron  75 MG TABS [id number] Yes Take 75  mg by mouth at bedtime. [provider]  Active   zinc gluconate 50 MG tablet [id number] Yes Take 50 mg by mouth daily. [provider]  Active             Home Care and Equipment/Supplies: Were Home Health Services Ordered?: No Any new equipment or medical supplies ordered?: No  Functional Questionnaire: Do you need assistance with bathing/showering or dressing?: No Do you need assistance with meal preparation?: No Do you need assistance with eating?: No Do you have difficulty maintaining continence: No Do you need assistance with getting out of bed/getting out of a chair/moving?: No Do you have difficulty managing or taking your medications?: No  Follow up appointments reviewed: PCP Follow-up appointment confirmed?: Yes Date of PCP follow-up appointment?: 07/24/24 Follow-up Provider: Camie Moats - with [facility] Specialist [facility] Follow-up appointment confirmed?: NA Do you need transportation to your follow-up appointment?: No Do you understand care options if your condition(s) worsen?: Yes-patient verbalized understanding  SDOH Interventions Today    Flowsheet Row Most Recent Value  SDOH Interventions   Food Insecurity Interventions Intervention Not Indicated  Housing Interventions Intervention Not Indicated  Transportation Interventions Intervention Not Indicated  Utilities Interventions Intervention Not Indicated    Goals Addressed             This Visit's Progress    VBCI Transitions of Care (TOC) Care Plan       Problems:  Recent Hospitalization for treatment of Admit/Discharge Date   8/25 - 8/26 [facility]   Primary Diagnosis: Colitis, C-diff Patient has had diagnosis of Cdiff x 3 in 2025 - reviewed possible fecal transplantation as per discharge information - patient will take discharge paperwork to PCP hospital follow up appointment to discuss GI referral  Unable to complete all assessment questions during initial call  related to the amount of time on call - will complete in future call  Goal:  Over the next 30 days, the patient will not experience hospital readmission  Interventions:  Transitions of Care: Doctor Visits  - discussed the importance of doctor visits Arranged PCP follow-up within 12-14 days (Care Guide Scheduled) Reviewed Vancomycin tapering dose Reviewed fecal transplantation - discuss GI referral at PCP appointment  Reinforced discharge instructions stating, It is very important that you not take Imodium or any similar medications to prevent/slow diarrhea given the infectious cause of your diarrhea as this can slow healing and also result in worsening condition (something called toxic megacolon).  Reviewed information on discharge instruction regarding left bundle branch block and discuss cardiology consult and PCP appointment Reviewed oxygen  therapy as patient reports she uses 1.5liters of O2 at bedtime and has albuterol  nebulizer prn not on d/c instruction (TOC RN noted this when routing note to PCP)   CAD Interventions: Assessed understanding of CAD diagnosis Medications reviewed including medications utilized in CAD treatment plan Provided education on Importance of limiting foods high in cholesterol Reviewed Importance of taking all medications as prescribed Reviewed Importance of attending all scheduled provider appointments Assessed social determinant of health barriers  Patient Self Care Activities:  Attend all scheduled provider appointments Call pharmacy for medication refills 3-7 days in advance of  running out of medications Call provider office for new concerns or questions  Notify RN Care Manager of TOC call rescheduling needs Participate in Transition of Care Program/Attend TOC scheduled calls Take medications as prescribed    Plan:  Telephone follow up appointment with care management team member scheduled for:  07/19/24 10am The patient has been provided with contact  information for the care management team and has been advised to call with any health related questions or concerns.         Shona Prow RN, CCM [place]  VBCI-Population Health RN Care Manager [phone]

## 2024-07-13 ENCOUNTER — Ambulatory Visit: Payer: Self-pay

## 2024-07-13 NOTE — Telephone Encounter (Signed)
 FYI Only or Action Required?: FYI only for provider.  Patient was last seen in primary care on 06/21/2024 by Nicholaus Credit, PA-C.  Called Nurse Triage reporting Stool Color Change.  Symptoms began today. - black stools - stools started turning dark on weds - now they are black and tarry  Interventions attempted: Nothing.  Symptoms are: gradually worsening.  Triage Disposition: Go to ED Now (Notify PCP)  Patient/caregiver understands and will follow disposition?: Yes              Copied from CRM [id number]. Topic: Clinical - Red Word Triage >> Jul 13, 2024  4:05 PM Nathanel BROCKS wrote: Red Word that prompted transfer to Nurse Triage: pt was in hosp with c-dif and they gave her bancomycin and her stool is dark brown/black. Is this normal. Reason for Disposition  Black or tarry bowel movements  (Exception: Chronic-unchanged black-grey BMs AND is taking iron pills or Pepto-Bismol.)  Answer Assessment - Initial Assessment Questions 1. APPEARANCE of BLOOD: What color is it? Is it passed separately, on the surface of the stool, or mixed in with the stool?      No blood black stools 2. AMOUNT: How much blood was passed?      unsure 3. FREQUENCY: How many times has blood been passed with the stools?      Each time - started getting darker on weds 4. ONSET: When was the blood first seen in the stools? (Days or weeks)      weds 5. DIARRHEA: Is there also some diarrhea? If Yes, ask: How many diarrhea stools in the past 24 hours?      no 6. CONSTIPATION: Do you have constipation? If Yes, ask: How bad is it?     no 7. RECURRENT SYMPTOMS: Have you had blood in your stools before? If Yes, ask: When was the last time? and What happened that time?      no 8. BLOOD THINNERS: Do you take any blood thinners? (e.g., aspirin, clopidogrel / Plavix, coumadin, heparin). Notes: Other strong blood thinners include: Arixtra (fondaparinux), Eliquis (apixaban), Pradaxa (dabigatran),  and Xarelto (rivaroxaban).     no 9. OTHER SYMPTOMS: Do you have any other symptoms?  (e.g., abdomen pain, vomiting, dizziness, fever)    Chills - hot spells  Answer Assessment - Initial Assessment Questions 1. COLOR: What color is your stool? Is that color in part or all of the stool? (e.g., black, clay-colored, green, red)      Black - weds started to be dark - now very black 2. ONSET: When did you first notice this color change?     weds 3. CAUSE: Have you eaten any food or taken any medicine of this color? Note: See listing in Background Information section.      Vanco 4. OTHER SYMPTOMS: Do you have any other symptoms? (e.g., abdomen pain, diarrhea, fever, yellow eyes or skin).     Has some chills and warm spells  Protocols used: Stools - Unusual Color-A-AH, Rectal Bleeding-A-AH

## 2024-07-19 ENCOUNTER — Telehealth: Payer: Self-pay

## 2024-07-19 NOTE — Transitions of Care (Post Inpatient/ED Visit) (Signed)
 07/19/2024  Name: Brenda Patrick MRN: [id number] DOB: [birth year]  Today's TOC FU Call Status: Today's TOC FU Call Status:: Successful TOC FU Call Completed TOC FU Call Complete Date: 07/19/24 Patient's Name and Date of Birth confirmed.  Transition Care Management Follow-up Telephone Call Date of Discharge: 07/14/24 Discharge Facility: Other ([facility]) Name of Other ([facility]) Discharge Facility: [facility] Type of Discharge: Inpatient Admission Primary Inpatient Discharge Diagnosis:: C. difficile and acute blood loss anemia. How have you been since you were released from the hospital?: Better Any questions or concerns?: No  Items Reviewed: Did you receive and understand the discharge instructions provided?: Yes Medications obtained,verified, and reconciled?: Yes (Medications Reviewed) Any new allergies since your discharge?: No Dietary orders reviewed?: Yes Type of Diet Ordered:: avoids seeds Do you have support at home?: Yes Name of Support/Comfort Primary Source: multiple people came during call  Medications Reviewed Today: Medications Reviewed Today     Reviewed by Lauro Shona LABOR, RN (Registered Nurse) on 07/19/24 at 1022  Med List Status: <None>   Medication Order Taking? Sig Documenting Provider Last Dose Status Informant  acetaminophen  (TYLENOL ) 650 MG CR tablet [id number] Yes Take 650 mg by mouth 2 (two) times daily. [provider]  Active   albuterol  (PROVENTIL ) (2.5 MG/3ML) 0.083% nebulizer solution [id number] Yes USE 1 VIAL IN NEBULIZER EVERY 6 HOURS - As Needed For Wheezing or Shortness of Breath Nicholaus Credit, PA-C  Active   amLODipine  (NORVASC ) 5 MG tablet [id number] Yes Take 5 mg by mouth daily. [provider]  Active   Ascorbic Acid (VITAMIN C) 1000 MG tablet [id number] Yes Take 1,000 mg by mouth daily. [provider]  Active   atenolol  (TENORMIN ) 50 MG tablet [id number] Yes Take 1 tablet (50 mg total) by mouth daily.  Nicholaus Credit, PA-C  Active   atorvastatin  (LIPITOR) 40 MG tablet [id number] Yes TAKE 1 TABLET BY MOUTH DAILY Nicholaus Credit, PA-C  Active   busPIRone  (BUSPAR ) 10 MG tablet [id number] Yes Take 10 mg by mouth 3 (three) times daily. [provider]  Active   dicyclomine  (BENTYL ) 10 MG capsule [id number] Yes TAKE 1 CAPSULE(10 MG) BY MOUTH THREE TIMES DAILY BEFORE MEALS Nicholaus Credit, PA-C  Active   fluticasone  (FLONASE ) 50 MCG/ACT nasal spray [id number] Yes SHAKE LIQUID AND USE 1 SPRAY IN EACH NOSTRIL AT BEDTIME Nicholaus Credit, PA-C  Active   LORazepam  (ATIVAN ) 1 MG tablet [id number] Yes TAKE 1 TABLET(1 MG) BY MOUTH TWICE DAILY AS NEEDED Nicholaus Credit, PA-C  Active   losartan -hydrochlorothiazide  (HYZAAR) 100-12.5 MG tablet [id number] Yes TAKE 1 TABLET BY MOUTH DAILY Nicholaus Credit, PA-C  Active   Multiple Vitamins-Minerals (MULTIVITAMIN WOMEN 50+ PO) [id number] Yes Take 1 tablet by mouth daily. [provider]  Active Self  omeprazole  (PRILOSEC) 40 MG capsule [id number] Yes TAKE 1 CAPSULE BY MOUTH DAILY Nicholaus Credit, PA-C  Active   ondansetron  (ZOFRAN -ODT) 4 MG disintegrating tablet [id number] Yes Take 4 mg by mouth every 6 (six) hours as needed. [provider]  Active   potassium chloride  (KLOR-CON  M) 10 MEQ tablet [id number]  Take 10 mEq by mouth 2 (two) times daily.  Patient not taking: Reported on 07/19/2024   [provider]  Active   Probiotic Product [facility]) CAPS [id number]  Take 1 capsule by mouth 2 (two) times daily.  Patient not taking: Reported on 07/19/2024   [provider]  Active   saccharomyces boulardii (FLORASTOR) 250 MG capsule [id number] Yes Take 250 mg by mouth  2 (two) times daily. [provider]  Active   vancomycin (VANCOCIN) 125 MG capsule [id number] Yes Take 125 mg by mouth 4 (four) times daily.  Patient taking differently: Take 125 mg by mouth 4 (four) times daily. 07/10/24 [facility] discharge lists 4x/day x 10 days, then 2x/day  for 7 days, then once daily for 7 days, then 3 days for 2 weeks Per 9/3 [facility] discharge states 9 more days   [provider]  Active   venlafaxine  XR (EFFEXOR -XR) 75 MG 24 hr capsule [id number] Yes TAKE 1 CAPSULE(75 MG) BY MOUTH DAILY WITH BREAKFAST Davis, Sara, PA-C  Active   Vibegron  75 MG TABS [id number] Yes Take 75 mg by mouth at bedtime. [provider]  Active   zinc gluconate 50 MG tablet [id number] Yes Take 50 mg by mouth daily. [provider]  Active             Home Care and Equipment/Supplies: Were Home Health Services Ordered?: No Any new equipment or medical supplies ordered?: No  Functional Questionnaire: Do you need assistance with bathing/showering or dressing?: No Do you need assistance with meal preparation?: No Do you need assistance with eating?: No Do you have difficulty maintaining continence: No Do you need assistance with getting out of bed/getting out of a chair/moving?: No Do you have difficulty managing or taking your medications?: No  Follow up appointments reviewed: PCP Follow-up appointment confirmed?: Yes Date of PCP follow-up appointment?: 07/24/24 Follow-up Provider: PCP office with Camie Moats Specialist [facility] Follow-up appointment confirmed?: NA (Patient states GI, Redell Sharps is going to call her to check in) Do you need transportation to your follow-up appointment?: No Do you understand care options if your condition(s) worsen?: Yes-patient verbalized understanding  SDOH Interventions Today    Flowsheet Row Most Recent Value  SDOH Interventions   Food Insecurity Interventions Intervention Not Indicated  Housing Interventions Intervention Not Indicated  Transportation Interventions Intervention Not Indicated  Utilities Interventions Intervention Not Indicated    Goals Addressed             This Visit's Progress    VBCI Transitions of Care (TOC) Care Plan       Problems:  New hospitalization  Admit/Discharge Date   8/30 - 9/3  [facility]   Primary Diagnosis: C. difficile and acute blood loss anemia (per record: Endoscopy performed, superficial ulceration in the small bowel that was oozing slightly. That was the only potential source of bleeding. treated this with a Hemoclip. Patient opted to continue with Vancomycin due to Difcid costing $400) Prior Hospitalization for treatment of Admit/Discharge Date   8/25 - 8/26 [facility]   Primary Diagnosis: Colitis, C-diff Patient has had diagnosis of Cdiff x 3 in 2025 - reviewed possible fecal transplantation as per discharge information - patient will take discharge paperwork to PCP hospital follow up appointment to discuss GI referral  Unable to complete all assessment questions during initial call related to patient received visit from multiple family members and TOC RN was respectful to patient wishes - will complete in future call  Goal:  Over the next 30 days, the patient will not experience hospital readmission  Interventions:  Transitions of Care: Doctor Visits  - discussed the importance of doctor visits Arranged PCP follow-up within 12-14 days (Care Guide Scheduled) Reviewed Vancomycin tapering dose Reviewed fecal transplantation - discuss GI referral at PCP appointment - patient is keeping previously scheduled 07/24/24 hospital follow with PCP - will take new discharge  papers Reinforced discharge instructions stating, It is very important that you not take Imodium or any similar medications to prevent/slow diarrhea given the infectious cause of your diarrhea as this can slow healing and also result in worsening condition (something called toxic megacolon).  Reviewed information on discharge instruction regarding left bundle branch block and discuss cardiology consult and PCP appointment Reviewed oxygen  therapy as patient reports she uses 1.5liters of O2 at bedtime and has albuterol  nebulizer prn not on d/c instruction  (TOC RN noted this when routing note to PCP) New enrollment completed 07/19/24: reviewed medications - new medications added - patient appears to have a good understanding of her medications.  Throughout the call, patient had several people showing up to visit her - [facility] RN agreed to complete assessments in future call  Patient states there was further discussion during this admission regarding fecal transplantation and states she thinks it will be done outpatient - Per record: The best option would be an oral form with pills called VOWST. Within 1 to 3 days following completion of antibiotic use, the patient is to drink approximately 10 mL of magnesium solution to clean the colon out and then take the pills daily over 3 days. This is likely going to be as an outpatient. I am concerned about cost and being able to get this set up in time. An appropriate option would also be using a probiotic such as Florastor upon discharge. Patient is new on Florastor    CAD Interventions: Assessed understanding of CAD diagnosis Medications reviewed including medications utilized in CAD treatment plan Provided education on Importance of limiting foods high in cholesterol Reviewed Importance of taking all medications as prescribed Reviewed Importance of attending all scheduled provider appointments Assessed social determinant of health barriers  Patient Self Care Activities:  Attend all scheduled provider appointments Call pharmacy for medication refills 3-7 days in advance of running out of medications Call provider office for new concerns or questions  Notify RN Care Manager of TOC call rescheduling needs Participate in Transition of Care Program/Attend TOC scheduled calls Take medications as prescribed    Plan:  Telephone follow up appointment with care management team member scheduled for:  07/25/24 11am The patient has been provided with contact information for the care management team and has been advised  to call with any health related questions or concerns.         Shona Prow RN, CCM [place]  VBCI-Population Health RN Care Manager [phone]

## 2024-07-23 ENCOUNTER — Telehealth: Payer: Self-pay | Admitting: Physician Assistant

## 2024-07-23 NOTE — Telephone Encounter (Signed)
 [facility]

## 2024-07-24 ENCOUNTER — Encounter: Payer: Self-pay | Admitting: Physician Assistant

## 2024-07-24 ENCOUNTER — Ambulatory Visit (INDEPENDENT_AMBULATORY_CARE_PROVIDER_SITE_OTHER): Admitting: Physician Assistant

## 2024-07-24 VITALS — BP 100/60 | HR 72 | Temp 97.9°F | Resp 18 | Ht 60.0 in | Wt 104.6 lb

## 2024-07-24 DIAGNOSIS — A0472 Enterocolitis due to Clostridium difficile, not specified as recurrent: Secondary | ICD-10-CM | POA: Diagnosis not present

## 2024-07-24 DIAGNOSIS — F418 Other specified anxiety disorders: Secondary | ICD-10-CM

## 2024-07-24 DIAGNOSIS — D649 Anemia, unspecified: Secondary | ICD-10-CM

## 2024-07-24 DIAGNOSIS — I10 Essential (primary) hypertension: Secondary | ICD-10-CM | POA: Diagnosis not present

## 2024-07-24 DIAGNOSIS — R899 Unspecified abnormal finding in specimens from other organs, systems and tissues: Secondary | ICD-10-CM

## 2024-07-24 NOTE — Progress Notes (Signed)
 Subjective:  Patient ID: Brenda Patrick, female    DOB: 01-11-1932  Age: 89 y.o. MRN: [id number]  Chief Complaint  Patient presents with   Hospitalization Follow-up    HPI  Follow up Hospitalization  Patient was admitted to [facility] and then to [facility] later that week for diagnosis of acute blood loss anemia and C diificile  Treatment for this included vancomycin then to Difizid but back to regimen of vancomycin 125mg  4 times daily for 9 days upon discharge. Telephone follow up was done on 07/23/24 She reports excellent compliance with treatment. She reports this condition is improved. She is now taking florastor as well (and stopped [facility])  She is going to follow up with GI Dr Claudene and is considering FMT after she completes her antibiotics.  Niece is with pt today and states they will be reaching out to their office today to see about treatment (VOWST)  Pt was given buspar  in hospital for anxiety and although listed on her med list today she does not have rx for that medication - she is to continue effexor  qd and ativan  prn  In hospital she was treated for hypertension with tenormin , losartan  (not hydrochlorothiazide ) was given and also norvasc  (which was started to replace hydrochlorothiazide ) She was supposed to resume losartan  / hctz upon discharge however norvasc  was actually called in for patient and she has been taking both medications.  She has not had any weakness or syncope but bp today significantly low at 100/60 Advised to continue norvasc  but stop losartan benson and will recheck bp in a few weeks in office and to monitor at home     06/21/2024    9:19 AM 01/10/2024    9:46 AM 11/28/2023    2:14 PM 09/21/2023   11:29 AM 09/08/2023   10:33 AM  Depression screen PHQ 2/9  Decreased Interest 1 1 1 1 3   Down, Depressed, Hopeless 1 2 1 1 3   PHQ - 2 Score 2 3 2 2 6   Altered sleeping 0 0 0 0 0  Tired, decreased energy 2 2 1 2 2   Change  in appetite 0 2 0 0 2  Feeling bad or failure about yourself  2 2 0 0 2  Trouble concentrating 0 1 0 1 0  Moving slowly or fidgety/restless 0 0 0 0 0  Suicidal thoughts 0 0 0 0 0  PHQ-9 Score 6 10 3 5 12   Difficult doing work/chores Not difficult at all Somewhat difficult Not difficult at all Not difficult at all Somewhat difficult        09/08/2023   10:32 AM 09/21/2023   11:24 AM 11/28/2023    2:14 PM 01/10/2024    9:46 AM 06/21/2024    9:19 AM  Fall Risk  Falls in the past year? 0 0 0 0 1  Was there an injury with Fall? 0 0 0 0 0  Fall Risk Category Calculator 0 0 0 0 1  Patient at Risk for Falls Due to No Fall Risks  No Fall Risks No Fall Risks History of fall(s)  Fall risk Follow up Falls evaluation completed Falls evaluation completed;Education provided;Falls prevention discussed Falls evaluation completed Falls evaluation completed Falls evaluation completed     ROS CONSTITUTIONAL: Negative for chills, fatigue, fever, unintentional weight gain and unintentional weight loss.  E/N/T: Negative for ear pain, nasal congestion and sore throat.  CARDIOVASCULAR: Negative for chest pain, dizziness, palpitations and pedal edema.  RESPIRATORY: Negative  for recent cough and dyspnea.  GASTROINTESTINAL: see HPI  PSYCHIATRIC: Negative for sleep disturbance and to question depression screen.  Negative for depression, negative for anhedonia.    Current Outpatient Medications:    acetaminophen  (TYLENOL ) 650 MG CR tablet, Take 650 mg by mouth 2 (two) times daily., Disp: , Rfl:    albuterol  (PROVENTIL ) (2.5 MG/3ML) 0.083% nebulizer solution, USE 1 VIAL IN NEBULIZER EVERY 6 HOURS - As Needed For Wheezing or Shortness of Breath, Disp: 3 mL, Rfl: 11   amLODipine  (NORVASC ) 5 MG tablet, Take 5 mg by mouth daily., Disp: , Rfl:    Ascorbic Acid (VITAMIN C) 1000 MG tablet, Take 1,000 mg by mouth daily., Disp: , Rfl:    atenolol  (TENORMIN ) 50 MG tablet, Take 1 tablet (50 mg total) by mouth daily., Disp:  100 tablet, Rfl: 1   atorvastatin  (LIPITOR) 40 MG tablet, TAKE 1 TABLET BY MOUTH DAILY, Disp: 100 tablet, Rfl: 1   dicyclomine  (BENTYL ) 10 MG capsule, TAKE 1 CAPSULE(10 MG) BY MOUTH THREE TIMES DAILY BEFORE MEALS, Disp: 90 capsule, Rfl: 3   fluticasone  (FLONASE ) 50 MCG/ACT nasal spray, SHAKE LIQUID AND USE 1 SPRAY IN EACH NOSTRIL AT BEDTIME, Disp: 16 g, Rfl: 6   LORazepam  (ATIVAN ) 1 MG tablet, TAKE 1 TABLET(1 MG) BY MOUTH TWICE DAILY AS NEEDED, Disp: 60 tablet, Rfl: 0   Multiple Vitamins-Minerals (MULTIVITAMIN WOMEN 50+ PO), Take 1 tablet by mouth daily., Disp: , Rfl:    omeprazole  (PRILOSEC) 40 MG capsule, TAKE 1 CAPSULE BY MOUTH DAILY, Disp: 100 capsule, Rfl: 1   ondansetron  (ZOFRAN -ODT) 4 MG disintegrating tablet, Take 4 mg by mouth every 6 (six) hours as needed., Disp: , Rfl:    saccharomyces boulardii (FLORASTOR) 250 MG capsule, Take 250 mg by mouth 2 (two) times daily., Disp: , Rfl:    vancomycin (VANCOCIN) 125 MG capsule, Take 125 mg by mouth 4 (four) times daily. (Patient taking differently: Take 125 mg by mouth 4 (four) times daily. 07/10/24 [facility] discharge lists 4x/day x 10 days, then 2x/day for 7 days, then once daily for 7 days, then 3 days for 2 weeks Per 9/3 [facility] discharge states 9 more days), Disp: , Rfl:    venlafaxine  XR (EFFEXOR -XR) 75 MG 24 hr capsule, TAKE 1 CAPSULE(75 MG) BY MOUTH DAILY WITH BREAKFAST, Disp: 90 capsule, Rfl: 0   Vibegron  75 MG TABS, Take 75 mg by mouth at bedtime., Disp: , Rfl:   Past Medical History:  Diagnosis Date   Acute bronchitis due to other specified organisms 04/25/2020   COPD (chronic obstructive pulmonary disease) (HCC) 05/26/2020   COVID-19 10/30/2019   Gastroesophageal reflux disease with esophagitis 05/26/2020   Hypertension    Major depressive disorder, single episode, mild (HCC) 05/26/2020   Mixed hyperlipidemia 05/26/2020   Moderate protein-calorie malnutrition (HCC) 05/26/2020   OSA (obstructive sleep apnea) 05/26/2020    Objective:  PHYSICAL EXAM:   BP 100/60   Pulse 72   Temp 97.9 F (36.6 C) (Temporal)   Resp 18   Ht 5' (1.524 m)   Wt 104 lb 9.6 oz (47.4 kg)   SpO2 98%   BMI 20.43 kg/m  BP Readings from Last 3 Encounters:  07/24/24 100/60  06/21/24 102/60  01/19/24 138/66      GEN: Well nourished, well developed, in no acute distress   Cardiac: RRR; no murmurs, rubs, or gallops,no edema - Respiratory:  normal respiratory rate and pattern with no distress - normal breath sounds with no rales, rhonchi,  wheezes or rubs  Skin: warm and dry, no rash   Psych: euthymic mood, appropriate affect and demeanor  Assessment & Plan:    Abnormal laboratory test -     Comprehensive metabolic panel with GFR -     Fe+CBC/D/Plt+TIBC+Fer+Retic  Anemia, unspecified type -     Fe+CBC/D/Plt+TIBC+Fer+Retic  Clostridium difficile colitis Complete vancomycin and follow up with GI for possible FMT Essential hypertension Stop losartan /hctz Continue norvasc  5mg  qd Depression with anxiety Continue effexor  and ativan     Follow-up: Return in about 3 weeks (around 08/14/2024) for follow-up.  An After Visit Summary was printed and given to the patient.  [name] [phone]

## 2024-07-25 ENCOUNTER — Ambulatory Visit: Payer: Self-pay | Admitting: Physician Assistant

## 2024-07-25 ENCOUNTER — Other Ambulatory Visit: Payer: Self-pay

## 2024-07-25 LAB — FE+CBC/D/PLT+TIBC+FER+RETIC
Basophils Absolute: 0.1 x10E3/uL (ref 0.0–0.2)
Basos: 1 %
EOS (ABSOLUTE): 0.1 x10E3/uL (ref 0.0–0.4)
Eos: 1 %
Ferritin: 64 ng/mL (ref 15–150)
Hematocrit: 36.6 % (ref 34.0–46.6)
Hemoglobin: 11.9 g/dL (ref 11.1–15.9)
Immature Grans (Abs): 0 x10E3/uL (ref 0.0–0.1)
Immature Granulocytes: 0 %
Iron Saturation: 25 % (ref 15–55)
Iron: 82 ug/dL (ref 27–139)
Lymphocytes Absolute: 1.9 x10E3/uL (ref 0.7–3.1)
Lymphs: 28 %
MCH: 32.8 pg (ref 26.6–33.0)
MCHC: 32.5 g/dL (ref 31.5–35.7)
MCV: 101 fL — ABNORMAL HIGH (ref 79–97)
Monocytes Absolute: 0.6 x10E3/uL (ref 0.1–0.9)
Monocytes: 8 %
Neutrophils Absolute: 4.2 x10E3/uL (ref 1.4–7.0)
Neutrophils: 62 %
Platelets: 416 x10E3/uL (ref 150–450)
RBC: 3.63 x10E6/uL — ABNORMAL LOW (ref 3.77–5.28)
RDW: 11.9 % (ref 11.7–15.4)
Retic Ct Pct: 1 % (ref 0.6–2.6)
Total Iron Binding Capacity: 324 ug/dL (ref 250–450)
UIBC: 242 ug/dL (ref 118–369)
WBC: 6.8 x10E3/uL (ref 3.4–10.8)

## 2024-07-25 LAB — COMPREHENSIVE METABOLIC PANEL WITH GFR
ALT: 18 IU/L (ref 0–32)
AST: 20 IU/L (ref 0–40)
Albumin: 4.2 g/dL (ref 3.6–4.6)
Alkaline Phosphatase: 93 IU/L (ref 44–121)
BUN/Creatinine Ratio: 21 (ref 12–28)
BUN: 15 mg/dL (ref 10–36)
Bilirubin Total: 0.6 mg/dL (ref 0.0–1.2)
CO2: 27 mmol/L (ref 20–29)
Calcium: 9.5 mg/dL (ref 8.7–10.3)
Chloride: 101 mmol/L (ref 96–106)
Creatinine, Ser: 0.7 mg/dL (ref 0.57–1.00)
Globulin, Total: 2.4 g/dL (ref 1.5–4.5)
Glucose: 68 mg/dL — ABNORMAL LOW (ref 70–99)
Potassium: 4.3 mmol/L (ref 3.5–5.2)
Sodium: 143 mmol/L (ref 134–144)
Total Protein: 6.6 g/dL (ref 6.0–8.5)
eGFR: 82 mL/min/1.73 (ref >59)

## 2024-07-25 NOTE — Transitions of Care (Post Inpatient/ED Visit) (Signed)
 Transition of Care week 2  Visit Note  07/25/2024  Name: Brenda Patrick MRN: [id number]          DOB: Aug 15, 1932  Situation: Patient enrolled in [facility] 30-day program. Visit completed with patient by telephone.   Background: Admit/Discharge Date   8/30 - 9/3  [facility]   Primary Diagnosis: C. difficile and acute blood loss anemia.  Admit/Discharge Date   8/25 - 8/26 [facility]   Primary Diagnosis: Colitis, C-diff  Initial Transition Care Management Follow-up Telephone Call    Past Medical History:  Diagnosis Date   Acute bronchitis due to other specified organisms 04/25/2020   COPD (chronic obstructive pulmonary disease) (HCC) 05/26/2020   COVID-19 10/30/2019   Gastroesophageal reflux disease with esophagitis 05/26/2020   Hypertension    Major depressive disorder, single episode, mild (HCC) 05/26/2020   Mixed hyperlipidemia 05/26/2020   Moderate protein-calorie malnutrition (HCC) 05/26/2020   OSA (obstructive sleep apnea) 05/26/2020    Assessment: Patient Reported Symptoms: Cognitive Cognitive Status: No symptoms reported, Normal speech and language skills, Alert and oriented to person, place, and time      Neurological Neurological Review of Symptoms: No symptoms reported    HEENT HEENT Symptoms Reported: No symptoms reported      Cardiovascular Cardiovascular Symptoms Reported: No symptoms reported    Respiratory Respiratory Symptoms Reported: No symptoms reported Other Respiratory Symptoms: patient denies any issues but states she uses the oxygen  at night as she states she was directed Respiratory Management Strategies: Oxygen  therapy  Endocrine Endocrine Symptoms Reported: No symptoms reported    Gastrointestinal Gastrointestinal Symptoms Reported: No symptoms reported Other Gastrointestinal Symptoms: patient denies any issues - states constipation resovlved denies any bleeding or black stools      Genitourinary Genitourinary Symptoms Reported: No  symptoms reported    Integumentary Integumentary Symptoms Reported: No symptoms reported    Musculoskeletal Musculoskelatal Symptoms Reviewed: No symptoms reported   Falls in the past year?: Yes Number of falls in past year: 1 or less Was there an injury with Fall?: Yes (patient states she was sawing a limb 1-2 months ago and the limb hit her and she hit the ground  - had scratches) Fall Risk Category Calculator: 2 Patient Fall Risk Level: Moderate Fall Risk Patient at Risk for Falls Due to: History of fall(s)  Psychosocial Psychosocial Symptoms Reported: Anxiety - if selected complete GAD, Depression - if selected complete PHQ 2-9 Behavioral Management Strategies: Medication therapy Major Change/Loss/Stressor/Fears (CP): Medical condition, self     There were no vitals filed for this visit.  Medications Reviewed Today     Reviewed by Lauro Shona LABOR, RN (Registered Nurse) on 07/25/24 at 1129  Med List Status: <None>   Medication Order Taking? Sig Documenting Provider Last Dose Status Informant  acetaminophen  (TYLENOL ) 650 MG CR tablet [id number] Yes Take 650 mg by mouth 2 (two) times daily. [provider]  Active   albuterol  (PROVENTIL ) (2.5 MG/3ML) 0.083% nebulizer solution [id number] Yes USE 1 VIAL IN NEBULIZER EVERY 6 HOURS - As Needed For Wheezing or Shortness of Breath Nicholaus Credit, PA-C  Active   amLODipine  (NORVASC ) 5 MG tablet [id number] Yes Take 5 mg by mouth daily. [provider]  Active   Ascorbic Acid (VITAMIN C) 1000 MG tablet [id number] Yes Take 1,000 mg by mouth daily. [provider]  Active   atenolol  (TENORMIN ) 50 MG tablet [id number] Yes Take 1 tablet (50 mg total) by mouth daily. Nicholaus Credit, PA-C  Active  atorvastatin  (LIPITOR) 40 MG tablet [id number] Yes TAKE 1 TABLET BY MOUTH DAILY Nicholaus Credit, PA-C  Active   dicyclomine  (BENTYL ) 10 MG capsule [id number] Yes TAKE 1 CAPSULE(10 MG) BY MOUTH THREE TIMES DAILY BEFORE MEALS Nicholaus Credit, PA-C   Active   fluticasone  (FLONASE ) 50 MCG/ACT nasal spray [id number] Yes SHAKE LIQUID AND USE 1 SPRAY IN EACH NOSTRIL AT BEDTIME Nicholaus Credit, PA-C  Active   LORazepam  (ATIVAN ) 1 MG tablet [id number] Yes TAKE 1 TABLET(1 MG) BY MOUTH TWICE DAILY AS NEEDED Nicholaus Credit, PA-C  Active   Multiple Vitamins-Minerals (MULTIVITAMIN WOMEN 50+ PO) [id number] Yes Take 1 tablet by mouth daily. [provider]  Active Self  omeprazole  (PRILOSEC) 40 MG capsule [id number] Yes TAKE 1 CAPSULE BY MOUTH DAILY Nicholaus Credit, PA-C  Active   ondansetron  (ZOFRAN -ODT) 4 MG disintegrating tablet [id number] Yes Take 4 mg by mouth every 6 (six) hours as needed. [provider]  Active   saccharomyces boulardii (FLORASTOR) 250 MG capsule [id number] Yes Take 250 mg by mouth 2 (two) times daily. [provider]  Active   vancomycin (VANCOCIN) 125 MG capsule [id number] Yes Take 125 mg by mouth 4 (four) times daily. [provider]  Active   venlafaxine  XR (EFFEXOR -XR) 75 MG 24 hr capsule [id number] Yes TAKE 1 CAPSULE(75 MG) BY MOUTH DAILY WITH BREAKFAST Davis, Sara, PA-C  Active   Vibegron  75 MG TABS [id number] Yes Take 75 mg by mouth at bedtime. [provider]  Active             Recommendation:   Continue Current Plan of Care  Follow Up Plan:   Telephone follow up appointment date/time:  08/01/24 11am  Shona Prow RN, CCM [place]  VBCI-Population Health RN Care Manager [phone]

## 2024-08-01 ENCOUNTER — Other Ambulatory Visit: Payer: Self-pay | Admitting: Physician Assistant

## 2024-08-01 ENCOUNTER — Other Ambulatory Visit: Payer: Self-pay

## 2024-08-01 DIAGNOSIS — I10 Essential (primary) hypertension: Secondary | ICD-10-CM

## 2024-08-01 NOTE — Transitions of Care (Post Inpatient/ED Visit) (Signed)
 Transition of Care week 3  Visit Note  08/01/2024  Name: Brenda Patrick MRN: [id number]          DOB: 01-20-1932  Situation: Patient enrolled in [facility] 30-day program. Visit completed with patient by telephone.   Background: Admit/Discharge Date   8/30 - 9/3  [facility]  Primary Diagnosis: C. difficile and acute blood loss anemia.  Admit/Discharge Date   8/25 - 8/26 [facility]   Primary Diagnosis: Colitis, C-diff  Initial Transition Care Management Follow-up Telephone Call    Past Medical History:  Diagnosis Date   Acute bronchitis due to other specified organisms 04/25/2020   COPD (chronic obstructive pulmonary disease) (HCC) 05/26/2020   COVID-19 10/30/2019   Gastroesophageal reflux disease with esophagitis 05/26/2020   Hypertension    Major depressive disorder, single episode, mild (HCC) 05/26/2020   Mixed hyperlipidemia 05/26/2020   Moderate protein-calorie malnutrition (HCC) 05/26/2020   OSA (obstructive sleep apnea) 05/26/2020    Assessment: Patient Reported Symptoms: Cognitive Cognitive Status: No symptoms reported, Normal speech and language skills, Alert and oriented to person, place, and time      Neurological Neurological Review of Symptoms: No symptoms reported    HEENT HEENT Symptoms Reported: No symptoms reported      Cardiovascular Cardiovascular Symptoms Reported: No symptoms reported Weight: 103 lb 8 oz (46.9 kg)  Respiratory Respiratory Symptoms Reported: No symptoms reported Other Respiratory Symptoms: patient states she has not had any shortness of breatah and cotinues to use O2 at night - pulse ox not working    Endocrine Endocrine Symptoms Reported: No symptoms reported    Gastrointestinal Gastrointestinal Symptoms Reported: Other Other Gastrointestinal Symptoms: Patient states she had loose stools the past 2 days related to drinking the solution prescribed by GI to claan her bowes prior to starting the Vowst      Genitourinary  Genitourinary Symptoms Reported: No symptoms reported    Integumentary Integumentary Symptoms Reported: No symptoms reported    Musculoskeletal Musculoskelatal Symptoms Reviewed: No symptoms reported        Psychosocial           Vitals:   08/01/24 1133  BP: 117/64  Pulse: 67    Medications Reviewed Today     Reviewed by Lauro Shona LABOR, RN (Registered Nurse) on 08/01/24 at 1202  Med List Status: <None>   Medication Order Taking? Sig Documenting Provider Last Dose Status Informant  acetaminophen  (TYLENOL ) 650 MG CR tablet [id number] Yes Take 650 mg by mouth 2 (two) times daily. [provider]  Active   albuterol  (PROVENTIL ) (2.5 MG/3ML) 0.083% nebulizer solution [id number] Yes USE 1 VIAL IN NEBULIZER EVERY 6 HOURS - As Needed For Wheezing or Shortness of Breath Nicholaus Credit, PA-C  Active   amLODipine  (NORVASC ) 5 MG tablet [id number]  Take 5 mg by mouth daily.  Patient not taking: Reported on 08/01/2024   [provider]  Active   Ascorbic Acid (VITAMIN C) 1000 MG tablet [id number] Yes Take 1,000 mg by mouth daily. [provider]  Active   atenolol  (TENORMIN ) 50 MG tablet [id number] Yes Take 1 tablet (50 mg total) by mouth daily. Nicholaus Credit, PA-C  Active   atorvastatin  (LIPITOR) 40 MG tablet [id number] Yes TAKE 1 TABLET BY MOUTH DAILY Nicholaus Credit, PA-C  Active   dicyclomine  (BENTYL ) 10 MG capsule [id number] Yes TAKE 1 CAPSULE(10 MG) BY MOUTH THREE TIMES DAILY BEFORE MEALS Nicholaus Credit, PA-C  Active   Fecal Microb Spores, Live-brpk (VOWST) CAPS [id number] Yes Take 4  capsules by mouth daily. [provider]  Active   fluticasone  (FLONASE ) 50 MCG/ACT nasal spray [id number] Yes SHAKE LIQUID AND USE 1 SPRAY IN EACH NOSTRIL AT BEDTIME Nicholaus Credit, PA-C  Active   LORazepam  (ATIVAN ) 1 MG tablet [id number] Yes TAKE 1 TABLET(1 MG) BY MOUTH TWICE DAILY AS NEEDED Nicholaus Credit, PA-C  Active   Multiple Vitamins-Minerals (MULTIVITAMIN WOMEN 50+ PO) [id number] Yes Take 1  tablet by mouth daily. [provider]  Active Self  omeprazole  (PRILOSEC) 40 MG capsule [id number] Yes TAKE 1 CAPSULE BY MOUTH DAILY Nicholaus Credit, PA-C  Active   ondansetron  (ZOFRAN -ODT) 4 MG disintegrating tablet [id number]  Take 4 mg by mouth every 6 (six) hours as needed.  Patient not taking: Reported on 08/01/2024   [provider]  Active   saccharomyces boulardii (FLORASTOR) 250 MG capsule [id number]  Take 250 mg by mouth 2 (two) times daily.  Patient not taking: Reported on 08/01/2024   [provider]  Active   vancomycin (VANCOCIN) 125 MG capsule [id number]  Take 125 mg by mouth 4 (four) times daily. [provider]  Consider Medication Status and Discontinue (Completed Course)   venlafaxine  XR (EFFEXOR -XR) 75 MG 24 hr capsule [id number] Yes TAKE 1 CAPSULE(75 MG) BY MOUTH DAILY WITH BREAKFAST Davis, Sara, PA-C  Active   Vibegron  75 MG TABS [id number] Yes Take 75 mg by mouth at bedtime. [provider]  Active             Recommendation:   Continue Current Plan of Care  Follow Up Plan:   Telephone follow up appointment date/time:  08/08/24 11am  Shona Prow RN, CCM [place]  VBCI-Population Health RN Care Manager [phone]

## 2024-08-03 ENCOUNTER — Telehealth: Payer: Self-pay | Admitting: Physician Assistant

## 2024-08-03 NOTE — Telephone Encounter (Signed)
 [facility] Oxygen  and Equipment Order

## 2024-08-06 ENCOUNTER — Telehealth: Payer: Self-pay

## 2024-08-06 NOTE — Telephone Encounter (Signed)
 Copied from CRM [id number]. Topic: General - Other >> Aug 06, 2024 11:07 AM Sophia H wrote: Reason for CRM: Wanted to let PA Camie know that they were not able to start home health, patient is not homebound. Patient states she does everything fine on her own, was able to get herself dressed, drive to church and run her errands.

## 2024-08-08 ENCOUNTER — Telehealth: Payer: Self-pay

## 2024-08-09 ENCOUNTER — Telehealth: Payer: Self-pay

## 2024-08-09 NOTE — Transitions of Care (Post Inpatient/ED Visit) (Signed)
 Transition of Care week 4  Visit Note  08/09/2024  Name: Brenda Patrick MRN: [id number]          DOB: Jun 02, 1932  Situation: Patient enrolled in [facility] 30-day program. Visit completed with patient  by telephone.   Background: Admit/Discharge Date   8/30 - 9/3  [facility]   Primary Diagnosis: C. difficile and acute blood loss anemia.  Admit/Discharge Date   8/25 - 8/26 [facility]   Primary Diagnosis: Colitis, C-diff  Initial Transition Care Management Follow-up Telephone Call    Past Medical History:  Diagnosis Date   Acute bronchitis due to other specified organisms 04/25/2020   COPD (chronic obstructive pulmonary disease) (HCC) 05/26/2020   COVID-19 10/30/2019   Gastroesophageal reflux disease with esophagitis 05/26/2020   Hypertension    Major depressive disorder, single episode, mild 05/26/2020   Mixed hyperlipidemia 05/26/2020   Moderate protein-calorie malnutrition 05/26/2020   OSA (obstructive sleep apnea) 05/26/2020    Assessment: Patient Reported Symptoms: Cognitive Cognitive Status: No symptoms reported, Normal speech and language skills, Alert and oriented to person, place, and time      Neurological Neurological Review of Symptoms: No symptoms reported    HEENT HEENT Symptoms Reported: No symptoms reported      Cardiovascular Cardiovascular Symptoms Reported: No symptoms reported    Respiratory Respiratory Symptoms Reported: No symptoms reported Other Respiratory Symptoms: Patient states her breathing is good today - plans to get battery for pulse ox    Endocrine Endocrine Symptoms Reported: No symptoms reported    Gastrointestinal Gastrointestinal Symptoms Reported: No symptoms reported Other Gastrointestinal Symptoms: patient reports no diarrhea and BMs are forming      Genitourinary Genitourinary Symptoms Reported: No symptoms reported    Integumentary Integumentary Symptoms Reported: No symptoms reported    Musculoskeletal  Musculoskelatal Symptoms Reviewed: No symptoms reported        Psychosocial           There were no vitals filed for this visit.  Medications Reviewed Today     Reviewed by Lauro Shona LABOR, RN (Registered Nurse) on 08/09/24 at 1125  Med List Status: <None>   Medication Order Taking? Sig Documenting Provider Last Dose Status Informant  acetaminophen  (TYLENOL ) 650 MG CR tablet [id number] Yes Take 650 mg by mouth 2 (two) times daily. [provider]  Active   albuterol  (PROVENTIL ) (2.5 MG/3ML) 0.083% nebulizer solution [id number] Yes USE 1 VIAL IN NEBULIZER EVERY 6 HOURS - As Needed For Wheezing or Shortness of Breath Nicholaus Credit, PA-C  Active   amLODipine  (NORVASC ) 5 MG tablet [id number] Yes Take 5 mg by mouth daily. [provider]  Active   Ascorbic Acid (VITAMIN C) 1000 MG tablet [id number] Yes Take 1,000 mg by mouth daily. [provider]  Active   atenolol  (TENORMIN ) 50 MG tablet [id number] Yes Take 1 tablet (50 mg total) by mouth daily. Nicholaus Credit, PA-C  Active   atorvastatin  (LIPITOR) 40 MG tablet [id number] Yes TAKE 1 TABLET BY MOUTH DAILY Nicholaus Credit, PA-C  Active   dicyclomine  (BENTYL ) 10 MG capsule [id number] Yes TAKE 1 CAPSULE(10 MG) BY MOUTH THREE TIMES DAILY BEFORE MEALS Nicholaus Credit, PA-C  Active   fluticasone  (FLONASE ) 50 MCG/ACT nasal spray [id number] Yes SHAKE LIQUID AND USE 1 SPRAY IN EACH NOSTRIL AT BEDTIME Nicholaus Credit, PA-C  Active   LORazepam  (ATIVAN ) 1 MG tablet [id number] Yes TAKE 1 TABLET(1 MG) BY MOUTH TWICE DAILY AS NEEDED Nicholaus Credit, PA-C  Active   Multiple Vitamins-Minerals (MULTIVITAMIN  WOMEN 50+ PO) [id number] Yes Take 1 tablet by mouth daily. [provider]  Active Self  omeprazole  (PRILOSEC) 40 MG capsule [id number] Yes TAKE 1 CAPSULE BY MOUTH DAILY Nicholaus Credit, PA-C  Active   ondansetron  (ZOFRAN -ODT) 4 MG disintegrating tablet [id number]  Take 4 mg by mouth every 6 (six) hours as needed.  Patient not taking: Reported on 08/09/2024    [provider]  Active   saccharomyces boulardii (FLORASTOR) 250 MG capsule [id number]  Take 250 mg by mouth 2 (two) times daily.  Patient not taking: Reported on 08/09/2024   [provider]  Active   vancomycin (VANCOCIN) 125 MG capsule [id number]  Take 125 mg by mouth 4 (four) times daily. [provider]  Consider Medication Status and Discontinue (Completed Course)   venlafaxine  XR (EFFEXOR -XR) 75 MG 24 hr capsule [id number] Yes TAKE 1 CAPSULE(75 MG) BY MOUTH DAILY WITH BREAKFAST Davis, Sara, PA-C  Active   Vibegron  75 MG TABS [id number] Yes Take 75 mg by mouth at bedtime. [provider]  Active             Recommendation:   Continue Current Plan of Care  Follow Up Plan:   Telephone follow up appointment date/time:  08/16/24 9am  Shona Prow RN, CCM [place]  VBCI-Population Health RN Care Manager [phone]

## 2024-08-10 ENCOUNTER — Ambulatory Visit: Payer: Self-pay

## 2024-08-10 NOTE — Telephone Encounter (Signed)
 Per Dr. Sirivol, If patient has any of the Losartan /hydroclorthiazide left, take 1/2 tablet in addition to other BP meds, and keep check on BP over next few days. If Systolic goes above 180, patient needs to go to ED or UC.   Patient was notified of these instructions.

## 2024-08-10 NOTE — Telephone Encounter (Signed)
 FYI Only or Action Required?: FYI only for provider.  Patient was last seen in primary care on 07/24/2024 by Nicholaus Credit, PA-C.  Called Nurse Triage reporting Hypertension.  Symptoms began yesterday.  Interventions attempted: Prescription medications: Norvasc , atenolol .  Symptoms are: gradually improving.  Triage Disposition: See Physician Within 24 Hours  Patient/caregiver understands and will follow disposition?: Yes  **Upcomming appt. previously scheduled for 10/2**        Message from [place] R sent at 08/10/2024 10:35 AM EDT  Summary: Blood Pressure   Reason for Triage: Patients niece Cassie calling in regards to patients BP, took it yesterday and was 203/70 something and today is 179/75. Her BP medication has recently changed but doesn't appear to be working.  Cassie can be reached at [phone]      Reason for Disposition  Systolic BP >= 180 OR Diastolic >= 110  Answer Assessment - Initial Assessment Questions 1. BLOOD PRESSURE: What is your blood pressure? Did you take at least two measurements 5 minutes apart?      Most current B/P was 179/75  2. ONSET: When did you take your blood pressure?     This morning   3. HOW: How did you take your blood pressure? (e.g., automatic home BP monitor, visiting nurse)     Home B/P cuff   4. HISTORY: Do you have a history of high blood pressure?     HTN  5. MEDICINES: Are you taking any medicines for blood pressure? Have you missed any doses recently?     No missed doses  6. OTHER SYMPTOMS: Do you have any symptoms? (e.g., blurred vision, chest pain, difficulty breathing, headache, weakness)  No.   Patient is asymptomatic. Upcoming appointment is on 10/2; they agree with plan of care and will call back if patient develops any symptoms.  Protocols used: Blood Pressure - High-A-AH

## 2024-08-16 ENCOUNTER — Telehealth: Payer: Self-pay

## 2024-08-16 ENCOUNTER — Other Ambulatory Visit: Payer: Self-pay | Admitting: Physician Assistant

## 2024-08-16 ENCOUNTER — Ambulatory Visit (INDEPENDENT_AMBULATORY_CARE_PROVIDER_SITE_OTHER): Admitting: Physician Assistant

## 2024-08-16 ENCOUNTER — Encounter: Payer: Self-pay | Admitting: Physician Assistant

## 2024-08-16 VITALS — BP 124/70 | HR 67 | Temp 98.2°F | Resp 18 | Ht 60.0 in | Wt 104.8 lb

## 2024-08-16 DIAGNOSIS — I10 Essential (primary) hypertension: Secondary | ICD-10-CM

## 2024-08-16 DIAGNOSIS — A0472 Enterocolitis due to Clostridium difficile, not specified as recurrent: Secondary | ICD-10-CM

## 2024-08-16 MED ORDER — VIBEGRON 75 MG PO TABS: 75.0000 mg | ORAL_TABLET | Freq: Every evening | ORAL | Status: DC

## 2024-08-16 MED ORDER — LOSARTAN POTASSIUM 100 MG PO TABS: 100.0000 mg | ORAL_TABLET | Freq: Every day | ORAL | 1 refills | Status: DC

## 2024-08-16 NOTE — Transitions of Care (Post Inpatient/ED Visit) (Signed)
 Transition of Care Week #4 [facility] Program Closure  Visit Note  08/16/2024  Name: Brenda Patrick MRN: [id number]          DOB: [birth year]  Situation: Patient enrolled in [facility] 30-day program. Visit completed with patient by telephone.   Background: Admit/Discharge Date   8/30 - 9/3  [facility]   Primary Diagnosis: C. difficile and acute blood loss anemia.  Admit/Discharge Date   8/25 - 8/26 [facility]   Primary Diagnosis: Colitis, C-diff  Initial Transition Care Management Follow-up Telephone Call    Past Medical History:  Diagnosis Date   Acute bronchitis due to other specified organisms 04/25/2020   COPD (chronic obstructive pulmonary disease) (HCC) 05/26/2020   COVID-19 10/30/2019   Gastroesophageal reflux disease with esophagitis 05/26/2020   Hypertension    Major depressive disorder, single episode, mild 05/26/2020   Mixed hyperlipidemia 05/26/2020   Moderate protein-calorie malnutrition 05/26/2020   OSA (obstructive sleep apnea) 05/26/2020    Assessment: Patient Reported Symptoms: Cognitive Cognitive Status: No symptoms reported, Normal speech and language skills, Alert and oriented to person, place, and time      Neurological      HEENT HEENT Symptoms Reported: No symptoms reported      Cardiovascular Cardiovascular Symptoms Reported: No symptoms reported (patient denied current symptms but reported lightheadedness 3 days ago after standing too long - is seeing PCP today for previously scheduled appt - states this started with back pain and BP/HR has been good)    Respiratory Respiratory Symptoms Reported: No symptoms reported    Endocrine Endocrine Symptoms Reported: No symptoms reported    Gastrointestinal Gastrointestinal Symptoms Reported: No symptoms reported      Genitourinary Genitourinary Symptoms Reported: No symptoms reported    Integumentary Integumentary Symptoms Reported: No symptoms reported    Musculoskeletal Musculoskelatal Symptoms  Reviewed: No symptoms reported        Psychosocial           There were no vitals filed for this visit.  Medications Reviewed Today     Reviewed by Lauro Shona LABOR, RN (Registered Nurse) on 08/16/24 at [phone]  Med List Status: <None>   Medication Order Taking? Sig Documenting Provider Last Dose Status Informant  acetaminophen  (TYLENOL ) 650 MG CR tablet [id number] Yes Take 650 mg by mouth 2 (two) times daily. [provider]  Active   albuterol  (PROVENTIL ) (2.5 MG/3ML) 0.083% nebulizer solution [id number] Yes USE 1 VIAL IN NEBULIZER EVERY 6 HOURS - As Needed For Wheezing or Shortness of Breath Nicholaus Credit, PA-C  Active   amLODipine  (NORVASC ) 5 MG tablet [id number] Yes Take 5 mg by mouth daily. [provider]  Active   Ascorbic Acid (VITAMIN C) 1000 MG tablet [id number] Yes Take 1,000 mg by mouth daily. [provider]  Active   atenolol  (TENORMIN ) 50 MG tablet [id number] Yes Take 1 tablet (50 mg total) by mouth daily. Nicholaus Credit, PA-C  Active   atorvastatin  (LIPITOR) 40 MG tablet [id number] Yes TAKE 1 TABLET BY MOUTH DAILY Nicholaus Credit, PA-C  Active   dicyclomine  (BENTYL ) 10 MG capsule [id number] Yes TAKE 1 CAPSULE(10 MG) BY MOUTH THREE TIMES DAILY BEFORE MEALS Nicholaus Credit, PA-C  Active   fluticasone  (FLONASE ) 50 MCG/ACT nasal spray [id number] Yes SHAKE LIQUID AND USE 1 SPRAY IN EACH NOSTRIL AT BEDTIME Nicholaus Credit, PA-C  Active   LORazepam  (ATIVAN ) 1 MG tablet [id number] Yes TAKE 1 TABLET(1 MG) BY MOUTH TWICE DAILY AS NEEDED Nicholaus Credit, PA-C  Active  Multiple Vitamins-Minerals (MULTIVITAMIN WOMEN 50+ PO) [id number] Yes Take 1 tablet by mouth daily. [provider]  Active Self  omeprazole  (PRILOSEC) 40 MG capsule [id number] Yes TAKE 1 CAPSULE BY MOUTH DAILY Nicholaus Credit, PA-C  Active   ondansetron  (ZOFRAN -ODT) 4 MG disintegrating tablet [id number]  Take 4 mg by mouth every 6 (six) hours as needed.  Patient not taking: Reported on 08/16/2024   [provider]  Active   saccharomyces boulardii (FLORASTOR) 250 MG capsule [id number]  Take 250 mg by mouth 2 (two) times daily.  Patient not taking: Reported on 08/09/2024   [provider]  Consider Medication Status and Discontinue (Completed Course)   vancomycin (VANCOCIN) 125 MG capsule [id number]  Take 125 mg by mouth 4 (four) times daily. [provider]  Consider Medication Status and Discontinue (Completed Course)   venlafaxine  XR (EFFEXOR -XR) 75 MG 24 hr capsule [id number] Yes TAKE 1 CAPSULE(75 MG) BY MOUTH DAILY WITH BREAKFAST Davis, Sara, PA-C  Active   Vibegron  75 MG TABS [id number] Yes Take 75 mg by mouth at bedtime. [provider]  Active             Recommendation:   PCP Follow-up  Follow Up Plan:   Closing From:  Transitions of Care Program  Shona Prow RN, CCM [facility]  VBCI-Population Health RN Care Manager [phone]

## 2024-08-16 NOTE — Progress Notes (Signed)
 Subjective:  Patient ID: Brenda Patrick, female    DOB: June 29, 1932  Age: 89 y.o. MRN: [id number]  Chief Complaint  Patient presents with   hypertension    HPI Pt in today for follow up - since last being seen we received a phone call from transition of care nurse stating pt was confused about her bp medications and perhaps not taking as prescribed.  Was recommended to have home health come out to assist with meds which was set up but because pt does still drive they did not qualify pt for services Pt states then bp had gotten significantly elevated and was advised to restart losartan benson (which had been stopped because of recurrent Cdiff and low blood pressures ) and niece states she was told to take both 1/2 tablet by one nurse and a whole tablet by another nurse Niece states that for the past week pt has been taking norvasc  5mg , tenormin  50mg  and losartan  /hctz 100/25 1/2 tablet daily and blood pressure readings have improved and back to normal - however because of bowel history and she easily dehydrates will stop the hctz Pt denies chest pain or shortness of breath however on Monday she had an episode of upper back pain that lasted a few minutes and she had to sit down and rest before it resolved  Pt did follow up with GI and has been prescribed Vibegron  to take as directed for her recurrent Cdiff     07/25/2024   11:44 AM 06/21/2024    9:19 AM 01/10/2024    9:46 AM 11/28/2023    2:14 PM 09/21/2023   11:29 AM  Depression screen PHQ 2/9  Decreased Interest 1 1 1 1 1   Down, Depressed, Hopeless 1 1 2 1 1   PHQ - 2 Score 2 2 3 2 2   Altered sleeping 0 0 0 0 0  Tired, decreased energy 0 2 2 1 2   Change in appetite 0 0 2 0 0  Feeling bad or failure about yourself  1 2 2  0 0  Trouble concentrating 0 0 1 0 1  Moving slowly or fidgety/restless 0 0 0 0 0  Suicidal thoughts 0 0 0 0 0  PHQ-9 Score 3 6 10 3 5   Difficult doing work/chores  Not difficult at all Somewhat difficult Not difficult  at all Not difficult at all        09/21/2023   11:24 AM 11/28/2023    2:14 PM 01/10/2024    9:46 AM 06/21/2024    9:19 AM 07/25/2024   11:38 AM  Fall Risk  Falls in the past year? 0 0 0 1 1  Was there an injury with Fall? 0 0 0 0 1  Was there an injury with Fall? - Comments     patient states she was sawing a limb 1-2 months ago and the limb hit her and she hit the ground  - had scratches  Fall Risk Category Calculator 0 0 0 1 2  Patient at Risk for Falls Due to  No Fall Risks No Fall Risks History of fall(s) History of fall(s)  Fall risk Follow up Falls evaluation completed;Education provided;Falls prevention discussed Falls evaluation completed Falls evaluation completed Falls evaluation completed      ROS CONSTITUTIONAL: Negative for chills, fatigue, fever, E/N/T: Negative for ear pain, nasal congestion and sore throat.  CARDIOVASCULAR: Negative for chest pain, dizziness, palpitations and pedal edema.  RESPIRATORY: Negative for recent cough and dyspnea.  GASTROINTESTINAL: see HPI MSK:  Negative for arthralgias and myalgias.  INTEGUMENTARY: Negative for rash.  NEUROLOGICAL: Negative for dizziness and headaches.  PSYCHIATRIC: Negative for sleep disturbance and to question depression screen.  Negative for depression, negative for anhedonia.    Current Outpatient Medications:    acetaminophen  (TYLENOL ) 650 MG CR tablet, Take 650 mg by mouth 2 (two) times daily., Disp: , Rfl:    albuterol  (PROVENTIL ) (2.5 MG/3ML) 0.083% nebulizer solution, USE 1 VIAL IN NEBULIZER EVERY 6 HOURS - As Needed For Wheezing or Shortness of Breath, Disp: 3 mL, Rfl: 11   amLODipine  (NORVASC ) 5 MG tablet, Take 5 mg by mouth daily., Disp: , Rfl:    Ascorbic Acid (VITAMIN C) 1000 MG tablet, Take 1,000 mg by mouth daily., Disp: , Rfl:    atenolol  (TENORMIN ) 50 MG tablet, Take 1 tablet (50 mg total) by mouth daily., Disp: 100 tablet, Rfl: 1   atorvastatin  (LIPITOR) 40 MG tablet, TAKE 1 TABLET BY MOUTH DAILY, Disp: 100  tablet, Rfl: 1   dicyclomine  (BENTYL ) 10 MG capsule, TAKE 1 CAPSULE(10 MG) BY MOUTH THREE TIMES DAILY BEFORE MEALS, Disp: 90 capsule, Rfl: 3   fluticasone  (FLONASE ) 50 MCG/ACT nasal spray, SHAKE LIQUID AND USE 1 SPRAY IN EACH NOSTRIL AT BEDTIME, Disp: 16 g, Rfl: 6   LORazepam  (ATIVAN ) 1 MG tablet, TAKE 1 TABLET(1 MG) BY MOUTH TWICE DAILY AS NEEDED, Disp: 60 tablet, Rfl: 0   losartan  (COZAAR ) 100 MG tablet, Take 1 tablet (100 mg total) by mouth daily., Disp: 30 tablet, Rfl: 1   Multiple Vitamins-Minerals (MULTIVITAMIN WOMEN 50+ PO), Take 1 tablet by mouth daily., Disp: , Rfl:    omeprazole  (PRILOSEC) 40 MG capsule, TAKE 1 CAPSULE BY MOUTH DAILY, Disp: 100 capsule, Rfl: 1   venlafaxine  XR (EFFEXOR -XR) 75 MG 24 hr capsule, TAKE 1 CAPSULE(75 MG) BY MOUTH DAILY WITH BREAKFAST, Disp: 90 capsule, Rfl: 0   vancomycin (VANCOCIN) 125 MG capsule, Take 125 mg by mouth 4 (four) times daily., Disp: , Rfl:   Past Medical History:  Diagnosis Date   Acute bronchitis due to other specified organisms 04/25/2020   COPD (chronic obstructive pulmonary disease) (HCC) 05/26/2020   COVID-19 10/30/2019   Gastroesophageal reflux disease with esophagitis 05/26/2020   Hypertension    Major depressive disorder, single episode, mild 05/26/2020   Mixed hyperlipidemia 05/26/2020   Moderate protein-calorie malnutrition 05/26/2020   OSA (obstructive sleep apnea) 05/26/2020   Objective:  PHYSICAL EXAM:   BP 124/70   Pulse 67   Temp 98.2 F (36.8 C) (Temporal)   Resp 18   Ht 5' (1.524 m)   Wt 104 lb 12.8 oz (47.5 kg)   SpO2 97%   BMI 20.47 kg/m    GEN: Well nourished, well developed, in no acute distress  Cardiac: RRR; no murmurs, rubs, or gallops,no edema - Respiratory:  normal respiratory rate and pattern with no distress - normal breath sounds with no rales, rhonchi, wheezes or rubs GI: normal bowel sounds, no masses or tenderness  Neuro:  Alert and Oriented x 3, - CN II-Xii grossly intact Psych: euthymic mood,  appropriate affect and demeanor  EKG - LVH otherwise no abnormality Assessment & Plan:    Primary hypertension -     EKG 12-Lead -     Losartan  Potassium; Take 1 tablet (100 mg total) by mouth daily.  Dispense: 30 tablet; Refill: 1 Continue norvasc  and atenolol  Clostridium difficile colitis Follow up with GI as scheduled and continue med as directed    Follow-up: Return in about  4 weeks (around 09/13/2024) for follow-up.  An After Visit Summary was printed and given to the patient.  [name] [phone]

## 2024-08-19 ENCOUNTER — Other Ambulatory Visit: Payer: Self-pay | Admitting: Physician Assistant

## 2024-08-20 ENCOUNTER — Other Ambulatory Visit: Payer: Self-pay | Admitting: Physician Assistant

## 2024-08-20 DIAGNOSIS — F418 Other specified anxiety disorders: Secondary | ICD-10-CM

## 2024-09-03 ENCOUNTER — Other Ambulatory Visit: Payer: Self-pay | Admitting: Physician Assistant

## 2024-09-03 DIAGNOSIS — E782 Mixed hyperlipidemia: Secondary | ICD-10-CM

## 2024-09-06 NOTE — Telephone Encounter (Unsigned)
 Copied from CRM [id number]. Topic: Clinical - Medication Refill >> Sep 06, 2024 10:34 AM Lonell PEDLAR wrote: Medication:  amLODipine  (NORVASC ) 5 MG tablet  fluticasone  (FLONASE ) 50 MCG/ACT nasal spray  Has the patient contacted their pharmacy? Yes, told patient she must clal office  This is the patient's preferred pharmacy:  [id number], [place] - [address] [place] [address] Phone: [phone] Fax: [phone]   Is this the correct pharmacy for this prescription? Yes If no, delete pharmacy and type the correct one.   Has the prescription been filled recently? Yes  Is the patient out of the medication? No  Has the patient been seen for an appointment in the last year OR does the patient have an upcoming appointment? Yes  Can we respond through MyChart? No  Agent: Please be advised that Rx refills may take up to 3 business days. We ask that you follow-up with your pharmacy.

## 2024-09-13 ENCOUNTER — Ambulatory Visit: Admitting: Physician Assistant

## 2024-09-13 ENCOUNTER — Telehealth: Payer: Self-pay | Admitting: Physician Assistant

## 2024-09-13 NOTE — Telephone Encounter (Signed)
 [facility]

## 2024-09-14 ENCOUNTER — Other Ambulatory Visit: Payer: Self-pay | Admitting: Physician Assistant

## 2024-09-14 NOTE — Telephone Encounter (Signed)
 Copied from CRM [id number]. Topic: Clinical - Medication Refill >> Sep 14, 2024 11:28 AM Amy B wrote: Medication: amLODipine  (NORVASC ) 5 MG tablet  Has the patient contacted their pharmacy? Yes (Agent: If no, request that the patient contact the pharmacy for the refill. If patient does not wish to contact the pharmacy document the reason why and proceed with request.) (Agent: If yes, when and what did the pharmacy advise?)  This is the patient's preferred pharmacy:  [facility] #[id number] [facility], [place] - [address] [place] [address] Phone: [phone] Fax: [phone]   Is this the correct pharmacy for this prescription? Yes If no, delete pharmacy and type the correct one.   Has the prescription been filled recently? No  Is the patient out of the medication? Yes  Has the patient been seen for an appointment in the last year OR does the patient have an upcoming appointment? Yes  Can we respond through MyChart? No  Agent: Please be advised that Rx refills may take up to 3 business days. We ask that you follow-up with your pharmacy.

## 2024-09-17 ENCOUNTER — Encounter: Payer: Self-pay | Admitting: Physician Assistant

## 2024-09-17 ENCOUNTER — Ambulatory Visit (INDEPENDENT_AMBULATORY_CARE_PROVIDER_SITE_OTHER): Admitting: Physician Assistant

## 2024-09-17 VITALS — BP 128/68 | HR 66 | Temp 97.9°F | Resp 18 | Ht 60.0 in | Wt 108.4 lb

## 2024-09-17 DIAGNOSIS — A0472 Enterocolitis due to Clostridium difficile, not specified as recurrent: Secondary | ICD-10-CM

## 2024-09-17 DIAGNOSIS — I10 Essential (primary) hypertension: Secondary | ICD-10-CM

## 2024-09-17 DIAGNOSIS — J449 Chronic obstructive pulmonary disease, unspecified: Secondary | ICD-10-CM | POA: Diagnosis not present

## 2024-09-17 MED ORDER — AMLODIPINE BESYLATE 5 MG PO TABS: 5.0000 mg | ORAL_TABLET | Freq: Every day | ORAL | 1 refills | Status: DC

## 2024-09-17 NOTE — Addendum Note (Signed)
 Addended by: NICHOLAUS CREDIT on: 09/17/2024 04:45 PM   Modules accepted: Level of Service

## 2024-09-17 NOTE — Progress Notes (Addendum)
 Subjective:  Patient ID: Brenda Patrick, female    DOB: September 03, 1932  Age: 89 y.o. MRN: [id number]  Chief Complaint  Patient presents with   Hypertension    HPI Pt in today for blood pressure follow up - she is taking losartan  100mg , atenolol  50mg  and amlodpine 5mg  qd - states overall doing well and denies chest pain, dyspnea or edema  Pt has history of Cdiff and states recently stomach has not been causing any pain and bowel movements back to normal Denies nausea, vomiting or abdominal pain  Pt with COPD - she states she uses oxygen  nightly with her CPAP at 1.5L rate.  States overall this helps her rest, sleep and breathe well through the night.  Denies chest pain, sob    07/25/2024   11:44 AM 06/21/2024    9:19 AM 01/10/2024    9:46 AM 11/28/2023    2:14 PM 09/21/2023   11:29 AM  Depression screen PHQ 2/9  Decreased Interest 1 1 1 1 1   Down, Depressed, Hopeless 1 1 2 1 1   PHQ - 2 Score 2 2 3 2 2   Altered sleeping 0 0 0 0 0  Tired, decreased energy 0 2 2 1 2   Change in appetite 0 0 2 0 0  Feeling bad or failure about yourself  1 2 2  0 0  Trouble concentrating 0 0 1 0 1  Moving slowly or fidgety/restless 0 0 0 0 0  Suicidal thoughts 0 0 0 0 0  PHQ-9 Score 3 6 10 3 5   Difficult doing work/chores  Not difficult at all Somewhat difficult Not difficult at all Not difficult at all        09/21/2023   11:24 AM 11/28/2023    2:14 PM 01/10/2024    9:46 AM 06/21/2024    9:19 AM 07/25/2024   11:38 AM  Fall Risk  Falls in the past year? 0 0 0 1 1  Was there an injury with Fall? 0 0 0 0 1  Was there an injury with Fall? - Comments     patient states she was sawing a limb 1-2 months ago and the limb hit her and she hit the ground  - had scratches  Fall Risk Category Calculator 0 0 0 1 2  Patient at Risk for Falls Due to  No Fall Risks No Fall Risks History of fall(s) History of fall(s)  Fall risk Follow up Falls evaluation completed;Education provided;Falls prevention discussed Falls  evaluation completed Falls evaluation completed Falls evaluation completed      ROS CONSTITUTIONAL: Negative for chills, fatigue, fever,  CARDIOVASCULAR: Negative for chest pain, dizziness, palpitations and pedal edema.  RESPIRATORY: Negative for recent cough and dyspnea.  GASTROINTESTINAL: see HPI MSK: Negative for arthralgias and myalgias.  INTEGUMENTARY: Negative for rash.     Current Outpatient Medications:    acetaminophen  (TYLENOL ) 650 MG CR tablet, Take 650 mg by mouth 2 (two) times daily., Disp: , Rfl:    albuterol  (PROVENTIL ) (2.5 MG/3ML) 0.083% nebulizer solution, USE 1 VIAL IN NEBULIZER EVERY 6 HOURS - As Needed For Wheezing or Shortness of Breath, Disp: 3 mL, Rfl: 11   amLODipine  (NORVASC ) 5 MG tablet, Take 1 tablet (5 mg total) by mouth daily., Disp: 90 tablet, Rfl: 1   Ascorbic Acid (VITAMIN C) 1000 MG tablet, Take 1,000 mg by mouth daily., Disp: , Rfl:    atenolol  (TENORMIN ) 50 MG tablet, Take 1 tablet (50 mg total) by mouth daily., Disp: 100 tablet,  Rfl: 1   atorvastatin  (LIPITOR) 40 MG tablet, TAKE 1 TABLET BY MOUTH DAILY, Disp: 100 tablet, Rfl: 1   dicyclomine  (BENTYL ) 10 MG capsule, TAKE 1 CAPSULE(10 MG) BY MOUTH THREE TIMES DAILY BEFORE MEALS, Disp: 90 capsule, Rfl: 3   fluticasone  (FLONASE ) 50 MCG/ACT nasal spray, SHAKE LIQUID AND USE 1 SPRAY IN EACH NOSTRIL AT BEDTIME, Disp: 16 g, Rfl: 6   LORazepam  (ATIVAN ) 1 MG tablet, TAKE 1 TABLET(1 MG) BY MOUTH TWICE DAILY AS NEEDED, Disp: 60 tablet, Rfl: 1   losartan  (COZAAR ) 100 MG tablet, TAKE 1 TABLET(100 MG) BY MOUTH DAILY, Disp: 90 tablet, Rfl: 0   Multiple Vitamins-Minerals (MULTIVITAMIN WOMEN 50+ PO), Take 1 tablet by mouth daily., Disp: , Rfl:    omeprazole  (PRILOSEC) 40 MG capsule, TAKE 1 CAPSULE BY MOUTH DAILY, Disp: 100 capsule, Rfl: 1   venlafaxine  XR (EFFEXOR -XR) 75 MG 24 hr capsule, TAKE 1 CAPSULE(75 MG) BY MOUTH DAILY WITH BREAKFAST, Disp: 90 capsule, Rfl: 0   Vibegron  75 MG TABS, Take 1 tablet (75 mg total) by  mouth at bedtime., Disp: , Rfl:   Past Medical History:  Diagnosis Date   Acute bronchitis due to other specified organisms 04/25/2020   COPD (chronic obstructive pulmonary disease) (HCC) 05/26/2020   COVID-19 10/30/2019   Gastroesophageal reflux disease with esophagitis 05/26/2020   Hypertension    Major depressive disorder, single episode, mild 05/26/2020   Mixed hyperlipidemia 05/26/2020   Moderate protein-calorie malnutrition 05/26/2020   OSA (obstructive sleep apnea) 05/26/2020   Objective:  PHYSICAL EXAM:   BP 128/68   Pulse 66   Temp 97.9 F (36.6 C) (Temporal)   Resp 18   Ht 5' (1.524 m)   Wt 108 lb 6.4 oz (49.2 kg)   SpO2 100%   BMI 21.17 kg/m    GEN: Well nourished, well developed, in no acute distress  Cardiac: RRR; no murmurs, rubs, or gallops,no edema -  Respiratory:  normal respiratory rate and pattern with no distress - normal breath sounds with no rales, rhonchi, wheezes or rubs Skin: warm and dry, no rash  Neuro:  Alert and Oriented x 3, - CN II-Xii grossly intact Psych: euthymic mood, appropriate affect and demeanor  Assessment & Plan:    Essential hypertension Continue current meds as directed Clostridium difficile colitis Continue to watch diet   COPD Continue nightly oxygen  therapy at 1.5L at bedtime  Follow-up: Return in about 3 months (around 12/18/2024) for chronic fasting follow-up.  An After Visit Summary was printed and given to the patient.  Brenda Patrick [facility] [phone]

## 2024-09-17 NOTE — Addendum Note (Signed)
 Addended by: NICHOLAUS CREDIT on: 09/17/2024 02:23 PM   Modules accepted: Level of Service

## 2024-09-26 ENCOUNTER — Ambulatory Visit: Payer: Medicare Other

## 2024-09-27 ENCOUNTER — Ambulatory Visit

## 2024-10-01 ENCOUNTER — Other Ambulatory Visit: Payer: Self-pay | Admitting: Physician Assistant

## 2024-10-01 DIAGNOSIS — K2100 Gastro-esophageal reflux disease with esophagitis, without bleeding: Secondary | ICD-10-CM

## 2024-10-15 ENCOUNTER — Telehealth: Payer: Self-pay | Admitting: Physician Assistant

## 2024-10-15 NOTE — Telephone Encounter (Signed)
 [facility]

## 2024-10-28 ENCOUNTER — Other Ambulatory Visit: Payer: Self-pay | Admitting: Physician Assistant

## 2024-10-28 DIAGNOSIS — K580 Irritable bowel syndrome with diarrhea: Secondary | ICD-10-CM

## 2024-11-10 ENCOUNTER — Other Ambulatory Visit: Payer: Self-pay | Admitting: Physician Assistant

## 2024-11-10 DIAGNOSIS — I10 Essential (primary) hypertension: Secondary | ICD-10-CM

## 2024-11-27 ENCOUNTER — Telehealth: Payer: Self-pay | Admitting: Physician Assistant

## 2024-11-27 NOTE — Telephone Encounter (Unsigned)
 Copied from CRM [id number]. Topic: Clinical - Medication Refill >> Nov 27, 2024  2:20 PM Gattis SQUIBB wrote: Medication:  Lorazepam  1 mg  She is completely out and needs it to sleep.   Has the patient contacted their pharmacy? No (If no, delete pharmacy and type the correct one.   [facility] [address]  [place] [address]  Has the prescription been filled recently? Yes  Is the patient out of the medication? Yes  Has the patient been seen for an appointment in the last year OR does the patient have an upcoming appointment? Yes  Can we respond through MyChart? No  Agent: Please be advised that Rx refills may take up to 3 business days. We ask that you follow-up with your pharmacy.

## 2024-11-27 NOTE — Telephone Encounter (Signed)
 [facility]

## 2024-11-29 ENCOUNTER — Other Ambulatory Visit: Payer: Self-pay

## 2024-11-29 ENCOUNTER — Other Ambulatory Visit: Payer: Self-pay | Admitting: Physician Assistant

## 2024-11-29 DIAGNOSIS — F418 Other specified anxiety disorders: Secondary | ICD-10-CM

## 2024-11-29 DIAGNOSIS — K2100 Gastro-esophageal reflux disease with esophagitis, without bleeding: Secondary | ICD-10-CM

## 2024-11-29 DIAGNOSIS — E782 Mixed hyperlipidemia: Secondary | ICD-10-CM

## 2024-11-29 DIAGNOSIS — K580 Irritable bowel syndrome with diarrhea: Secondary | ICD-10-CM

## 2024-11-29 DIAGNOSIS — I10 Essential (primary) hypertension: Secondary | ICD-10-CM

## 2024-11-29 DIAGNOSIS — J449 Chronic obstructive pulmonary disease, unspecified: Secondary | ICD-10-CM

## 2024-11-29 MED ORDER — LOSARTAN POTASSIUM 100 MG PO TABS: 100.0000 mg | ORAL_TABLET | Freq: Every day | ORAL | 1 refills | Status: AC

## 2024-11-29 MED ORDER — ALBUTEROL SULFATE (2.5 MG/3ML) 0.083% IN NEBU: INHALATION_SOLUTION | RESPIRATORY_TRACT | 11 refills | Status: AC

## 2024-11-29 MED ORDER — ATENOLOL 50 MG PO TABS: 50.0000 mg | ORAL_TABLET | Freq: Every day | ORAL | 1 refills | Status: AC

## 2024-11-29 MED ORDER — AMLODIPINE BESYLATE 5 MG PO TABS: 5.0000 mg | ORAL_TABLET | Freq: Every day | ORAL | 1 refills | Status: AC

## 2024-11-29 MED ORDER — VIBEGRON 75 MG PO TABS: 75.0000 mg | ORAL_TABLET | Freq: Every evening | ORAL | 1 refills | Status: AC

## 2024-11-29 MED ORDER — LORAZEPAM 1 MG PO TABS: ORAL_TABLET | ORAL | 1 refills | Status: AC

## 2024-11-29 MED ORDER — VENLAFAXINE HCL ER 75 MG PO CP24: 75.0000 mg | ORAL_CAPSULE | Freq: Every day | ORAL | 1 refills | Status: AC

## 2024-11-29 MED ORDER — FLUTICASONE PROPIONATE 50 MCG/ACT NA SUSP: NASAL | 6 refills | Status: AC

## 2024-11-29 MED ORDER — ATORVASTATIN CALCIUM 40 MG PO TABS: 40.0000 mg | ORAL_TABLET | Freq: Every day | ORAL | 1 refills | Status: AC

## 2024-11-29 MED ORDER — DICYCLOMINE HCL 10 MG PO CAPS: 10.0000 mg | ORAL_CAPSULE | Freq: Three times a day (TID) | ORAL | 1 refills | Status: AC

## 2024-11-29 MED ORDER — OMEPRAZOLE 40 MG PO CPDR: 40.0000 mg | DELAYED_RELEASE_CAPSULE | Freq: Every day | ORAL | 1 refills | Status: AC

## 2024-11-29 NOTE — Telephone Encounter (Signed)
 Copied from CRM [id number]. Topic: Clinical - Medication Question >> Nov 29, 2024 11:54 AM Brenda Patrick wrote: Reason for CRM:  Patient called stating that her insurance have changed- she needs all prescriptions to be sent to [facility]- states that the cost is cheaper for the medicine

## 2024-11-29 NOTE — Telephone Encounter (Signed)
 Copied from CRM [id number]. Topic: Clinical - Medication Refill >> Nov 29, 2024 11:50 AM Tiffini S wrote: Medication: LORazepam  (ATIVAN ) 1 MG tablet  Has the patient contacted their pharmacy? Yes (Agent: If no, request that the patient contact the pharmacy for the refill. If patient does not wish to contact the pharmacy document the reason why and proceed with request.) (Agent: If yes, when and what did the pharmacy advise?)  This is the patient's preferred pharmacy:   [facility] [address] - [place], [place] - [address] [place] [address] Phone: [phone] Fax: [phone]  Is this the correct pharmacy for this prescription? Yes If no, delete pharmacy and type the correct one.   Has the prescription been filled recently? Yes  Is the patient out of the medication? Yes  Has the patient been seen for an appointment in the last year OR does the patient have an upcoming appointment? Yes  Can we respond through MyChart? No, please call [phone]  Agent: Please be advised that Rx refills may take up to 3 business days. We ask that you follow-up with your pharmacy.

## 2024-12-09 ENCOUNTER — Other Ambulatory Visit: Payer: Self-pay | Admitting: Physician Assistant

## 2024-12-09 DIAGNOSIS — I10 Essential (primary) hypertension: Secondary | ICD-10-CM

## 2024-12-24 ENCOUNTER — Ambulatory Visit: Payer: Self-pay | Admitting: Physician Assistant
